# Patient Record
Sex: Female | Born: 1985
Health system: Southern US, Community
[De-identification: ages and names within clinical notes are randomized; demographics above are authoritative.]

## PROBLEM LIST (undated history)

## (undated) ENCOUNTER — Inpatient Hospital Stay (HOSPITAL_COMMUNITY): Payer: Self-pay

## (undated) DIAGNOSIS — O1410 Severe pre-eclampsia, unspecified trimester: Secondary | ICD-10-CM

## (undated) DIAGNOSIS — E785 Hyperlipidemia, unspecified: Secondary | ICD-10-CM

## (undated) DIAGNOSIS — O24419 Gestational diabetes mellitus in pregnancy, unspecified control: Secondary | ICD-10-CM

## (undated) DIAGNOSIS — A630 Anogenital (venereal) warts: Secondary | ICD-10-CM

## (undated) DIAGNOSIS — A549 Gonococcal infection, unspecified: Secondary | ICD-10-CM

## (undated) DIAGNOSIS — I471 Supraventricular tachycardia, unspecified: Secondary | ICD-10-CM

## (undated) DIAGNOSIS — Z789 Other specified health status: Secondary | ICD-10-CM

## (undated) DIAGNOSIS — I10 Essential (primary) hypertension: Secondary | ICD-10-CM

## (undated) HISTORY — PX: OTHER SURGICAL HISTORY: SHX169

## (undated) HISTORY — DX: Supraventricular tachycardia: I47.1

## (undated) HISTORY — DX: Supraventricular tachycardia, unspecified: I47.10

## (undated) HISTORY — DX: Severe pre-eclampsia, unspecified trimester: O14.10

---

## 2011-05-30 ENCOUNTER — Inpatient Hospital Stay (HOSPITAL_COMMUNITY)
Admission: AD | Admit: 2011-05-30 | Discharge: 2011-05-30 | Disposition: A | Discharge status: Home / Self Care | Payer: Self-pay | Source: Ambulatory Visit | Attending: Obstetrics and Gynecology | Admitting: Obstetrics and Gynecology

## 2011-05-30 ENCOUNTER — Encounter (HOSPITAL_COMMUNITY): Payer: Self-pay | Admitting: *Deleted

## 2011-05-30 DIAGNOSIS — Z30432 Encounter for removal of intrauterine contraceptive device: Secondary | ICD-10-CM | POA: Insufficient documentation

## 2011-05-30 DIAGNOSIS — N949 Unspecified condition associated with female genital organs and menstrual cycle: Secondary | ICD-10-CM | POA: Insufficient documentation

## 2011-05-30 DIAGNOSIS — N390 Urinary tract infection, site not specified: Secondary | ICD-10-CM | POA: Insufficient documentation

## 2011-05-30 DIAGNOSIS — N72 Inflammatory disease of cervix uteri: Secondary | ICD-10-CM | POA: Insufficient documentation

## 2011-05-30 DIAGNOSIS — A5901 Trichomonal vulvovaginitis: Secondary | ICD-10-CM | POA: Insufficient documentation

## 2011-05-30 DIAGNOSIS — R109 Unspecified abdominal pain: Secondary | ICD-10-CM | POA: Insufficient documentation

## 2011-05-30 HISTORY — DX: Anogenital (venereal) warts: A63.0

## 2011-05-30 HISTORY — DX: Gestational diabetes mellitus in pregnancy, unspecified control: O24.419

## 2011-05-30 LAB — URINE MICROSCOPIC-ADD ON

## 2011-05-30 LAB — CBC
MCH: 29.5 pg (ref 26.0–34.0)
Platelets: 176 10*3/uL (ref 150–400)
RBC: 4.37 MIL/uL (ref 3.87–5.11)
RDW: 14 % (ref 11.5–15.5)
WBC: 10.1 10*3/uL (ref 4.0–10.5)

## 2011-05-30 LAB — WET PREP, GENITAL: Clue Cells Wet Prep HPF POC: NONE SEEN

## 2011-05-30 LAB — URINALYSIS, ROUTINE W REFLEX MICROSCOPIC
Bilirubin Urine: NEGATIVE
Protein, ur: NEGATIVE mg/dL
Urobilinogen, UA: 0.2 mg/dL (ref 0.0–1.0)

## 2011-05-30 LAB — HCG, QUANTITATIVE, PREGNANCY: hCG, Beta Chain, Quant, S: <1 m[IU]/mL (ref <5)

## 2011-05-30 LAB — DIFFERENTIAL
Basophils Absolute: 0 10*3/uL (ref 0.0–0.1)
Eosinophils Absolute: 0.4 10*3/uL (ref 0.0–0.7)
Lymphocytes Relative: 27 % (ref 12–46)
Lymphs Abs: 2.8 10*3/uL (ref 0.7–4.0)
Neutrophils Relative %: 61 % (ref 43–77)

## 2011-05-30 MED ORDER — AZITHROMYCIN 250 MG PO TABS
1000.0000 mg | ORAL_TABLET | Freq: Once | ORAL | Status: AC
  Administered 2011-05-30: 1000 mg via ORAL
  Filled 2011-05-30: qty 4

## 2011-05-30 MED ORDER — METRONIDAZOLE 500 MG PO TABS: 500.0000 mg | ORAL_TABLET | Freq: Two times a day (BID) | ORAL | Status: AC

## 2011-05-30 MED ORDER — CEFTRIAXONE SODIUM 250 MG IJ SOLR
250.0000 mg | Freq: Once | INTRAMUSCULAR | Status: AC
  Administered 2011-05-30: 250 mg via INTRAMUSCULAR
  Filled 2011-05-30: qty 250

## 2011-05-30 NOTE — Progress Notes (Signed)
Pt has mirena IUD, for past 2 months has been feeling fluttering in abdomen & also sharp pains.  Pt has intermittent spotting "now & then" but not today.

## 2011-05-30 NOTE — ED Provider Notes (Signed)
History     CSN: [id number]  Arrival date & time 05/30/11  1624   None     Chief Complaint  Patient presents with  . Abdominal Pain    HPI Dorothy Meadows is a 26 y.o. female who presents to [facility] for "fluttering in abdomen". Has IUD for birth control but feels pregnant. Has had IUD for over 4 years now and request that if possible that it be removed. Vaginal bleeding "spotting off and on x 2 months. Low back pain that comes and goes. Current sex partner x 1 year. History of genital warts.   Past Medical History  Diagnosis Date  . Tachycardia   . Genital warts   . Gestational diabetes     Past Surgical History  Procedure Date  . No past surgeries     History reviewed. No pertinent family history.  History  Substance Use Topics  . Smoking status: Current Everyday Smoker -- 0.5 packs/day for 10 years    Types: Cigarettes  . Smokeless tobacco: Not on file  . Alcohol Use: No    OB History    Grav Para Term Preterm Abortions TAB SAB Ect Mult Living   2 2 2       2       Review of Systems  Constitutional: Positive for activity change. Negative for fever, chills, diaphoresis and fatigue.  HENT: Negative for ear pain, congestion, sore throat, facial swelling, neck pain, neck stiffness, dental problem and sinus pressure.   Eyes: Negative for photophobia, pain and discharge.  Respiratory: Negative for cough, chest tightness and wheezing.   Cardiovascular: Palpitations: tachycardia.  Gastrointestinal: Positive for nausea, abdominal pain, diarrhea and constipation. Negative for vomiting and abdominal distention.  Genitourinary: Positive for vaginal bleeding, vaginal discharge and pelvic pain. Negative for dysuria, frequency, flank pain, difficulty urinating and dyspareunia.  Musculoskeletal: Negative for myalgias, back pain and gait problem.  Skin: Negative for color change and rash.  Neurological: Negative for dizziness, speech difficulty, weakness, light-headedness, numbness  and headaches.  Psychiatric/Behavioral: Negative for confusion and agitation. The patient is not nervous/anxious.     Allergies  Review of patient's allergies indicates no known allergies.  Home Medications  No current outpatient prescriptions on file.  BP 125/73  Pulse 90  Temp(Src) 98.4 F (36.9 C) (Oral)  Resp 16  Ht 5\' 9"  (1.753 m)  Wt 210 lb (95.255 kg)  BMI 31.01 kg/m2  Physical Exam  Nursing note and vitals reviewed. Constitutional: She is oriented to person, place, and time. She appears well-developed and well-nourished. No distress.  HENT:  Head: Normocephalic.  Eyes: EOM are normal.  Neck: Neck supple.  Pulmonary/Chest: Effort normal.  Abdominal: Soft. There is no tenderness.  Musculoskeletal: Normal range of motion.  Neurological: She is alert and oriented to person, place, and time. No cranial nerve deficit.  Skin: Skin is warm and dry.  Psychiatric: She has a normal mood and affect. Her behavior is normal. Judgment and thought content normal.   Results for orders placed during the hospital encounter of 05/30/11 (from the past 24 hour(s))  URINALYSIS, ROUTINE W REFLEX MICROSCOPIC     Status: Abnormal   Collection Time   05/30/11  4:35 PM      Component Value Range   Color, Urine YELLOW  YELLOW    APPearance HAZY (*) CLEAR    Specific Gravity, Urine 1.020  1.005 - 1.030    pH 6.0  5.0 - 8.0    Glucose, UA NEGATIVE  NEGATIVE (mg/dL)   Hgb urine dipstick TRACE (*) NEGATIVE    Bilirubin Urine NEGATIVE  NEGATIVE    Ketones, ur NEGATIVE  NEGATIVE (mg/dL)   Protein, ur NEGATIVE  NEGATIVE (mg/dL)   Urobilinogen, UA 0.2  0.0 - 1.0 (mg/dL)   Nitrite POSITIVE (*) NEGATIVE    Leukocytes, UA MODERATE (*) NEGATIVE   URINE MICROSCOPIC-ADD ON     Status: Abnormal   Collection Time   05/30/11  4:35 PM      Component Value Range   Squamous Epithelial / LPF FEW (*) RARE    WBC, UA 11-20  <3 (WBC/hpf)   RBC / HPF 3-6  <3 (RBC/hpf)   Bacteria, UA MANY (*) RARE     Urine-Other MUCOUS PRESENT    POCT PREGNANCY, URINE     Status: Normal   Collection Time   05/30/11  4:57 PM      Component Value Range   Preg Test, Ur NEGATIVE  NEGATIVE   HCG, QUANTITATIVE, PREGNANCY     Status: Normal   Collection Time   05/30/11  5:19 PM      Component Value Range   hCG, Beta Chain, Quant, S <1  <5 (mIU/mL)  CBC     Status: Normal   Collection Time   05/30/11  6:10 PM      Component Value Range   WBC 10.1  4.0 - 10.5 (K/uL)   RBC 4.37  3.87 - 5.11 (MIL/uL)   Hemoglobin 12.9  12.0 - 15.0 (g/dL)   HCT 16.1  09.6 - 04.5 (%)   MCV 92.2  78.0 - 100.0 (fL)   MCH 29.5  26.0 - 34.0 (pg)   MCHC 32.0  30.0 - 36.0 (g/dL)   RDW 40.9  81.1 - 91.4 (%)   Platelets 176  150 - 400 (K/uL)  DIFFERENTIAL     Status: Normal   Collection Time   05/30/11  6:10 PM      Component Value Range   Neutrophils Relative 61  43 - 77 (%)   Neutro Abs 6.2  1.7 - 7.7 (K/uL)   Lymphocytes Relative 27  12 - 46 (%)   Lymphs Abs 2.8  0.7 - 4.0 (K/uL)   Monocytes Relative 8  3 - 12 (%)   Monocytes Absolute 0.8  0.1 - 1.0 (K/uL)   Eosinophils Relative 4  0 - 5 (%)   Eosinophils Absolute 0.4  0.0 - 0.7 (K/uL)   Basophils Relative 0  0 - 1 (%)   Basophils Absolute 0.0  0.0 - 0.1 (K/uL)  WET PREP, GENITAL     Status: Abnormal   Collection Time   05/30/11  6:20 PM      Component Value Range   Yeast Wet Prep HPF POC NONE SEEN  NONE SEEN    Trich, Wet Prep MODERATE (*) NONE SEEN    Clue Cells Wet Prep HPF POC NONE SEEN  NONE SEEN    WBC, Wet Prep HPF POC MODERATE (*) NONE SEEN     Assessment: Trichomonas/Cervicitis   Pelvic pain   UTI  Plan:  Rx flagyl   IUD removed per request of patient   Rocephin 250 mg po   Zithromax 1 gram po ED Course  Procedures   MDM          Kerrie Buffalo, NP 05/30/11 1948

## 2011-05-30 NOTE — ED Provider Notes (Signed)
Attestation of Attending Supervision of Advanced Practitioner: Evaluation and management procedures were performed by the PA/NP/CNM/OB Fellow under my supervision/collaboration. Chart reviewed, and agree with management and plan.  Shayana Hornstein, M.D. 05/30/2011 7:57 PM   

## 2011-06-01 ENCOUNTER — Telehealth (HOSPITAL_COMMUNITY): Payer: Self-pay | Admitting: *Deleted

## 2011-06-01 NOTE — Telephone Encounter (Signed)
Telephone call to patient regarding positive chlamydia culture, patient not in, left message for her to call.  Patient was treated at time of visit.  Report of treatment faxed to health department.

## 2011-07-09 ENCOUNTER — Emergency Department (HOSPITAL_COMMUNITY)
Admission: EM | Admit: 2011-07-09 | Discharge: 2011-07-09 | Disposition: A | Discharge status: Home / Self Care | Payer: Self-pay | Attending: Emergency Medicine | Admitting: Emergency Medicine

## 2011-07-09 ENCOUNTER — Encounter (HOSPITAL_COMMUNITY): Payer: Self-pay | Admitting: Emergency Medicine

## 2011-07-09 DIAGNOSIS — R350 Frequency of micturition: Secondary | ICD-10-CM | POA: Insufficient documentation

## 2011-07-09 DIAGNOSIS — N390 Urinary tract infection, site not specified: Secondary | ICD-10-CM

## 2011-07-09 DIAGNOSIS — F17200 Nicotine dependence, unspecified, uncomplicated: Secondary | ICD-10-CM | POA: Insufficient documentation

## 2011-07-09 LAB — URINE MICROSCOPIC-ADD ON

## 2011-07-09 LAB — POCT PREGNANCY, URINE: Preg Test, Ur: NEGATIVE

## 2011-07-09 LAB — URINALYSIS, ROUTINE W REFLEX MICROSCOPIC
Bilirubin Urine: NEGATIVE
Glucose, UA: NEGATIVE mg/dL
Specific Gravity, Urine: 1.027 (ref 1.005–1.030)
Urobilinogen, UA: 0.2 mg/dL (ref 0.0–1.0)

## 2011-07-09 MED ORDER — CIPROFLOXACIN HCL 500 MG PO TABS: 500.0000 mg | ORAL_TABLET | Freq: Two times a day (BID) | ORAL | Status: AC

## 2011-07-09 MED ORDER — PHENAZOPYRIDINE HCL 200 MG PO TABS: 200.0000 mg | ORAL_TABLET | Freq: Three times a day (TID) | ORAL | Status: DC

## 2011-07-09 MED ORDER — PHENAZOPYRIDINE HCL 200 MG PO TABS: 200.0000 mg | ORAL_TABLET | Freq: Three times a day (TID) | ORAL | Status: AC

## 2011-07-09 MED ORDER — CIPROFLOXACIN HCL 500 MG PO TABS: 500.0000 mg | ORAL_TABLET | Freq: Two times a day (BID) | ORAL | Status: DC

## 2011-07-09 NOTE — Discharge Instructions (Signed)

## 2011-07-09 NOTE — ED Notes (Signed)
Alert no distress. edp has already seen

## 2011-07-09 NOTE — ED Provider Notes (Signed)
History     CSN: [id number]  Arrival date & time 07/09/11  1448   First MD Initiated Contact with Patient 07/09/11 1525      Chief Complaint  Patient presents with  . Urinary Frequency    (Consider location/radiation/quality/duration/timing/severity/associated sxs/prior treatment) Patient is a 26 y.o. female presenting with frequency. The history is provided by the patient.  Urinary Frequency This is a new problem. Episode onset: this morning. The problem occurs constantly. The problem has been gradually worsening. Pertinent negatives include no abdominal pain. Exacerbated by: urinating. The symptoms are relieved by nothing. She has tried nothing for the symptoms.    Past Medical History  Diagnosis Date  . Tachycardia   . Genital warts   . Gestational diabetes     Past Surgical History  Procedure Date  . No past surgeries     History reviewed. No pertinent family history.  History  Substance Use Topics  . Smoking status: Current Everyday Smoker -- 0.5 packs/day for 10 years    Types: Cigarettes  . Smokeless tobacco: Not on file  . Alcohol Use: No    OB History    Grav Para Term Preterm Abortions TAB SAB Ect Mult Living   2 2 2       2       Review of Systems  Gastrointestinal: Negative for abdominal pain.  Genitourinary: Positive for frequency.  All other systems reviewed and are negative.    Allergies  Review of patient's allergies indicates no known allergies.  Home Medications  No current outpatient prescriptions on file.  BP 152/77  Pulse 76  Temp(Src) 97.4 F (36.3 C) (Oral)  Resp 18  SpO2 99%  LMP 06/06/2011  Physical Exam  Nursing note and vitals reviewed. Constitutional: She is oriented to person, place, and time. She appears well-developed and well-nourished. No distress.  HENT:  Head: Normocephalic and atraumatic.  Neck: Normal range of motion. Neck supple.  Abdominal: Soft. Bowel sounds are normal. She exhibits no distension. There  is no tenderness.  Musculoskeletal: Normal range of motion. She exhibits no edema.  Lymphadenopathy:    She has no cervical adenopathy.  Neurological: She is alert and oriented to person, place, and time.  Skin: Skin is warm and dry. She is not diaphoretic.    ED Course  Procedures (including critical care time)   Labs Reviewed  URINALYSIS, ROUTINE W REFLEX MICROSCOPIC   No results found.   No diagnosis found.    MDM  Urine looks like UTI.  Will treat with cipro, pyridium.        Geoffery Lyons, MD 07/09/11 1550

## 2011-07-09 NOTE — ED Notes (Signed)
Pt reports having dysuria and frequency onset today.

## 2011-09-12 ENCOUNTER — Inpatient Hospital Stay (HOSPITAL_COMMUNITY)
Admission: AD | Admit: 2011-09-12 | Discharge: 2011-09-12 | Disposition: A | Discharge status: Home / Self Care | Payer: Self-pay | Source: Ambulatory Visit | Attending: Obstetrics & Gynecology | Admitting: Obstetrics & Gynecology

## 2011-09-12 ENCOUNTER — Encounter (HOSPITAL_COMMUNITY): Payer: Self-pay | Admitting: *Deleted

## 2011-09-12 DIAGNOSIS — Z3201 Encounter for pregnancy test, result positive: Secondary | ICD-10-CM | POA: Insufficient documentation

## 2011-09-12 HISTORY — DX: Other specified health status: Z78.9

## 2011-09-12 LAB — POCT PREGNANCY, URINE: Preg Test, Ur: POSITIVE — AB

## 2011-09-12 NOTE — [facility] Provider Note (Signed)
Attestation of Attending Supervision of Advanced Practitioner: Evaluation and management procedures were performed by the OB Fellow/PA/CNM/NP under my supervision and collaboration. Chart reviewed, and agree with management and plan.  Isidoro Santillana, M.D. 09/12/2011 5:51 PM   

## 2011-09-12 NOTE — [facility] Note (Signed)
Patient states she had a positive pregnancy test at home today. Wants confirmation of pregnancy letter. Denies any problems with pain or bleeding.

## 2011-09-12 NOTE — [facility] Provider Note (Signed)
Dorothy Meadows is a 26 y.o. female who presents to [facility] @ 5 weeks and 2 days gestation for pregnancy verification. She denies any problems. Has had 2 positive HPT. Was here in March and had negative pregnancy test. LMP 08/07/11.  BP 139/88  Pulse 70  Temp(Src) 98.9 F (37.2 C) (Oral)  Resp 16  Ht 5\' 4"  (1.626 m)  Wt 232 lb 3.2 oz (105.325 kg)  BMI 39.86 kg/m2  SpO2 100%  LMP 08/07/2011  Results for orders placed during the hospital encounter of 09/12/11 (from the past 24 hour(s))  POCT PREGNANCY, URINE     Status: Abnormal   Collection Time   09/12/11  3:03 PM      Component Value Range   Preg Test, Ur POSITIVE (*) NEGATIVE    Assessment: Positive pregnancy test  Plan:  Pregnancy verification letter   Start prenatal care   Return here as needed for problems.

## 2011-09-12 NOTE — Discharge Instructions (Signed)
  ________________________________________     To schedule your Maternity Eligibility Appointment, please call [phone].  When you arrive for your appointment you must bring the following items or information listed below.  Your appointment will be rescheduled if you do not have these items or are 15 minutes late. If currently receiving Medicaid, you MUST bring: 1. Medicaid Card 2. Social Security Card 3. Picture ID 4. Proof of Pregnancy 5. Verification of current address if the address on Medicaid card is incorrect "postmarked mail" If not receiving Medicaid, you MUST bring: 1. Social Security Card 2. Picture ID 3. Birth Certificate (if available) Passport or *Green Card 4. Proof of Pregnancy 5. Verification of current address "postmarked mail" for each income presented. 6. Verification of insurance coverage, if any 7. Check stubs from each employer for the previous month (if unable to present check stub  for each week, we will accept check stub for the first and last week ill the same month.) If you can't locate check stubs, you must bring a letter from the employer(s) and it must have the following information on letterhead, typed, in English: o name of company o company telephone number o how long been with the company, if less than one month o how much person earns per hour o how many hours per week work o the gross pay the person earned for the previous month If you are 27 years old or less, you do not have to bring proof of income unless you work or live with the father of the baby and at that time we will need proof of income from you and/or the father of the baby. Green Card recipients are eligible for Medicaid for Pregnant Women (MPW)    Prenatal Care  WHAT IS PRENATAL CARE?  Prenatal care means health care during your pregnancy, before your baby is born. Take care of yourself and your baby by:   Getting early prenatal care. If you know you are pregnant, or think  you might be pregnant, call your caregiver as soon as possible. Schedule a visit for a general/prenatal examination.   Getting regular prenatal care. Follow your caregiver's schedule for blood and other necessary tests. Do not miss appointments.   Do everything you can to keep yourself and your baby healthy during your pregnancy.   Prenatal care should include evaluation of medical, dietary, educational, psychological, and social needs for the couple and the medical, surgical, and genetic history of the family of the mother and father.   Discuss with your caregiver:   Your medicines, prescription, over-the-counter, and herbal medicines.   Substance abuse, alcohol, smoking, and illegal drugs.   Domestic abuse and violence, if present.   Your immunizations.   Nutrition and diet.   Exercising.   Environment and occupational hazards, at home and at work.   History of sexually transmitted disease, both you and your partner.   Previous pregnancies.  WHY IS PRENATAL CARE SO IMPORTANT?  By seeing you regularly, your caregiver has the chance to find problems early, so that they can be treated as soon as possible. Other problems might be prevented. Many studies have shown that early and regular prenatal care is important for the health of both mothers and their babies.  I AM THINKING ABOUT GETTING PREGNANT. HOW CAN I TAKE CARE OF MYSELF?  Taking care of yourself before you get pregnant helps you to have a healthy pregnancy. It also lowers your chances of having a baby born with a birth   defect. Here are ways to take care of yourself before you get pregnant:   Eat healthy foods, exercise regularly (30 minutes per day for most days of the week is best), and get enough rest and sleep. Talk to your caregiver about what kinds of foods and exercises are best for you.   Take 400 micrograms (mcg) of folic acid (one of the B vitamins) every day. The best way to do this is to take a daily multivitamin  pill that contains this amount of folic acid. Getting enough of the synthetic (manufactured) form of folic acid every day before you get pregnant and during early pregnancy can help prevent certain birth defects. Many breakfast cereals and other grain products have folic acid added to them, but only certain cereals contain 400 mcg of folic acid per serving. Check the label on your multivitamin or cereal to find the amount of folic acid in the food.   See your caregiver for a complete check up before getting pregnant. Make sure that you have had all your immunization shots, especially for rubella (German measles). Rubella can cause serious birth defects. Chickenpox is another illness you want to avoid during pregnancy. If you have had chickenpox and rubella in the past, you should be immune to them.   Tell your caregiver about any prescription or non-prescription medicines (including herbal remedies) you are taking. Some medicines are not safe to take during pregnancy.   Stop smoking cigarettes, drinking alcohol, or taking illegal drugs. Ask your caregiver for help, if you need it. You can also get help with alcohol and drugs by talking with a member of your faith community, a counselor, or a trusted friend.   Discuss and treat any medical, social, or psychological problems before getting pregnant.   Discuss any history of genetic problems in the mother, father, and their families. Do genetic testing before getting pregnant, when possible.   Discuss any physical or emotional abuse with your caregiver.   Discuss with your caregiver if you might be exposed to harmful chemicals on your job or where you live.   Discuss with your caregiver if you think your job or the hours you work may be harmful and should be changed.   The father should be involved with the decision making and with all aspects of the pregnancy, labor, and delivery.   If you have medical insurance, make sure you are covered for  pregnancy.  I JUST FOUND OUT THAT I AM PREGNANT. HOW CAN I TAKE CARE OF MYSELF?  Here are ways to take care of yourself and the precious new life growing inside you:   Continue taking your multivitamin with 400 micrograms (mcg) of folic acid every day.   Get early and regular prenatal care. It does not matter if this is your first pregnancy or if you already have children. It is very important to see a caregiver during your pregnancy. Your caregiver will check at each visit to make sure that you and the baby are healthy. If there are any problems, action can be taken right away to help you and the baby.   Eat a healthy diet that includes:   Fruits.   Vegetables.   Foods low in saturated fat.   Grains.   Calcium-rich foods.   Drink 6 to 8 glasses of liquids a day.   Unless your caregiver tells you not to, try to be physically active for 30 minutes, most days of the week. If you are pressed for   time, you can get your activity in through 10 minute segments, three times a day.   If you smoke, drink alcohol, or use drugs, STOP. These can cause long-term damage to your baby. Talk with your caregiver about steps to take to stop smoking. Talk with a member of your faith community, a counselor, a trusted friend, or your caregiver if you are concerned about your alcohol or drug use.   Ask your caregiver before taking any medicine, even over-the-counter medicines. Some medicines are not safe to take during pregnancy.   Get plenty of rest and sleep.   Avoid hot tubs and saunas during pregnancy.   Do not have X-rays taken, unless absolutely necessary and with the recommendation of your caregiver. A lead shield can be placed on your abdomen, to protect the baby when X-rays are taken in other parts of the body.   Do not empty the cat litter when you are pregnant. It may contain a parasite that causes an infection called toxoplasmosis, which can cause birth defects. Also, use gloves when working in  garden areas used by cats.   Do not eat uncooked or undercooked cheese, meats, or fish.   Stay away from toxic chemicals like:   Insecticides.   Solvents (some cleaners or paint thinners).   Lead.   Mercury.   Sexual relations may continue until the end of the pregnancy, unless you have a medical problem or there is a problem with the pregnancy and your caregiver tells you not to.   Do not wear high heel shoes, especially during the second half of the pregnancy. You can lose your balance and fall.   Do not take long trips, unless absolutely necessary. Be sure to see your caregiver before going on the trip.   Do not sit in one position for more than 2 hours, when on a trip.   Take a copy of your medical records when going on a trip.   Know where there is a hospital in the city you are visiting, in case of an emergency.   Most dangerous household products will have pregnancy warnings on their labels. Ask your caregiver about products if you are unsure.   Limit or eliminate your caffeine intake from coffee, tea, sodas, medicines, and chocolate.   Many women continue working through pregnancy. Staying active might help you stay healthier. If you have a question about the safety or the hours you work at your particular job, talk with your caregiver.   Get informed:   Read books.   Watch videos.   Go to childbirth classes for you and the father.   Talk with experienced moms.   Ask your caregiver about childbirth education classes for you and your partner. Classes can help you and your partner prepare for the birth of your baby.   Ask about a pediatrician (baby doctor) and methods and pain medicine for labor, delivery, and possible Cesarean delivery (C-section).  I AM NOT THINKING ABOUT GETTING PREGNANT RIGHT NOW, BUT HEARD THAT ALL WOMEN SHOULD TAKE FOLIC ACID EVERY DAY?  All women of childbearing age, with even a remote chance of getting pregnant, should try to make sure they  get enough folic acid. Many pregnancies are not planned. Many women do not know they are actually pregnant early in their pregnancies, and certain birth defects happen in the very early part of pregnancy. Taking 400 micrograms (mcg) of folic acid every day will help prevent certain birth defects that happen in the early   part of pregnancy. If a woman begins taking vitamin pills in the second or third month of pregnancy, it may be too late to prevent birth defects. Folic acid may also have other health benefits for women, besides preventing birth defects.  HOW OFTEN SHOULD I SEE MY CAREGIVER DURING PREGNANCY?  Your caregiver will give you a schedule for your prenatal visits. You will have visits more often as you get closer to the end of your pregnancy. An average pregnancy lasts about 40 weeks.  A typical schedule includes visiting your caregiver:   About once each month, during your first 6 months of pregnancy.   Every 2 weeks, during the next 2 months.   Weekly in the last month, until the delivery date.  Your caregiver will probably want to see you more often if:  You are over 35.   Your pregnancy is high risk, because you have certain health problems or problems with the pregnancy, such as:   Diabetes.   High blood pressure.   The baby is not growing on schedule, according to the dates of the pregnancy.  Your caregiver will do special tests, to make sure you and the baby are not having any serious problems. WHAT HAPPENS DURING PRENATAL VISITS?   At your first prenatal visit, your caregiver will talk to you about you and your partner's health history and your family's health history, and will do a physical exam.   On your first visit, a physical exam will include checks of your blood pressure, height and weight, and an exam of your pelvic organs. Your caregiver will do a Pap test if you have not had one recently, and will do cultures of your cervix to make sure there is no infection.    At each visit, there will be tests of your blood, urine, blood pressure, weight, and checking the progress of the baby.   Your caregiver will be able to tell you when to expect that your baby will be born.   Each visit is also a chance for you to learn about staying healthy during pregnancy and for asking questions.   Discuss whether you will be breastfeeding.   At your later prenatal visits, your caregiver will check how you are doing and how the baby is developing. You may have a number of tests done as your pregnancy progresses.   Ultrasound exams are often used to check on the baby's growth and health.   You may have more urine and blood tests, as well as special tests, if needed. These may include amniocentesis (examine fluid in the pregnancy sac), stress tests (check how baby responds to contractions), biophysical profile (measures fetus well-being). Your caregiver will explain the tests and why they are necessary.  I AM IN MY LATE THIRTIES, AND I WANT TO HAVE A CHILD NOW. SHOULD I DO ANYTHING SPECIAL?  As you get older, there is more chance of having a medical problem (high blood pressure), pregnancy problem (preeclampsia, problems with the placenta), miscarriage, or a baby born with a birth defect. However, most women in their late thirties and early forties have healthy babies. See your caregiver on a regular basis before you get pregnant and be sure to go for exams throughout your pregnancy. Your caregiver probably will want to do some special tests to check on you and your baby's health when you are pregnant.  Women today are often delaying having children until later in life, when they are in their thirties and forties. While   many women in their thirties and forties have no difficulty getting pregnant, fertility does decline with age. For women over 40 who cannot get pregnant after 6 months of trying, it is recommended that they see their caregiver for a fertility evaluation. It is not  uncommon to have trouble becoming pregnant or experience infertility (inability to become pregnant after trying for one year). If you think that you or your partner may be infertile, you can discuss this with your caregiver. He or she can recommend treatments such as drugs, surgery, or assisted reproductive technology.  Document Released: 04/20/2003 Document Revised: 04/06/2011 Document Reviewed: 03/17/2009 [employer] Patient Information 2012 [employer]. 

## 2011-09-27 ENCOUNTER — Inpatient Hospital Stay (HOSPITAL_COMMUNITY): Payer: Self-pay

## 2011-09-27 ENCOUNTER — Inpatient Hospital Stay (HOSPITAL_COMMUNITY)
Admission: AD | Admit: 2011-09-27 | Discharge: 2011-09-27 | Disposition: A | Discharge status: Home / Self Care | Payer: Self-pay | Source: Ambulatory Visit | Attending: Obstetrics & Gynecology | Admitting: Obstetrics & Gynecology

## 2011-09-27 ENCOUNTER — Encounter (HOSPITAL_COMMUNITY): Payer: Self-pay

## 2011-09-27 DIAGNOSIS — R109 Unspecified abdominal pain: Secondary | ICD-10-CM | POA: Insufficient documentation

## 2011-09-27 DIAGNOSIS — O209 Hemorrhage in early pregnancy, unspecified: Secondary | ICD-10-CM | POA: Insufficient documentation

## 2011-09-27 LAB — URINALYSIS, ROUTINE W REFLEX MICROSCOPIC
Bilirubin Urine: NEGATIVE
Ketones, ur: NEGATIVE mg/dL
Nitrite: NEGATIVE
Protein, ur: NEGATIVE mg/dL
Specific Gravity, Urine: 1.01 (ref 1.005–1.030)
Urobilinogen, UA: 0.2 mg/dL (ref 0.0–1.0)

## 2011-09-27 LAB — CBC
MCH: 29.5 pg (ref 26.0–34.0)
MCHC: 32.2 g/dL (ref 30.0–36.0)
MCV: 91.8 fL (ref 78.0–100.0)
Platelets: 198 10*3/uL (ref 150–400)
RBC: 4.4 MIL/uL (ref 3.87–5.11)
RDW: 13.9 % (ref 11.5–15.5)

## 2011-09-27 LAB — DIFFERENTIAL
Basophils Relative: 0 % (ref 0–1)
Eosinophils Absolute: 0.3 10*3/uL (ref 0.0–0.7)
Eosinophils Relative: 2 % (ref 0–5)
Lymphs Abs: 3 10*3/uL (ref 0.7–4.0)

## 2011-09-27 LAB — WET PREP, GENITAL
Trich, Wet Prep: NONE SEEN
Yeast Wet Prep HPF POC: NONE SEEN

## 2011-09-27 LAB — URINE MICROSCOPIC-ADD ON

## 2011-09-27 LAB — HCG, QUANTITATIVE, PREGNANCY: hCG, Beta Chain, Quant, S: 338 m[IU]/mL — ABNORMAL HIGH (ref <5)

## 2011-09-27 NOTE — MAU Note (Signed)
Patient states she has been cramping and having some bleeding since 5-27. Was brown now more red. Not wearing a pad at this time.

## 2011-09-27 NOTE — Discharge Instructions (Signed)
Return here in 2 days to repeat the pregnancy hormone level. Return sooner for any problems.  Pelvic Rest Pelvic rest is sometimes recommended for women when:   The placenta is partially or completely covering the opening of the cervix (placenta previa).   There is bleeding between the uterine wall and the amniotic sac in the first trimester (subchorionic hemorrhage).   The cervix begins to open without labor starting (incompetent cervix, cervical insufficiency).   The labor is too early (preterm labor).  HOME CARE INSTRUCTIONS  Do not have sexual intercourse, stimulation, or an orgasm.   Do not use tampons, douche, or put anything in the vagina.     . Vaginal Bleeding During Pregnancy, First Trimester A small amount of bleeding (spotting) is relatively common in early pregnancy. It usually stops on its own. There are many causes for bleeding or spotting in early pregnancy. Some bleeding may be related to the pregnancy and some may not. Cramping with the bleeding is more serious and concerning. Tell your caregiver if you have any vaginal bleeding.  CAUSES  It is normal in most cases.  The pregnancy ends (miscarriage).  The pregnancy may end (threatened miscarriage).  Infection or inflammation of the cervix.  Growths (polyps) on the cervix.  Pregnancy happens outside of the uterus and in a fallopian tube (tubal pregnancy).  Many tiny cysts in the uterus instead of pregnancy tissue (molar pregnancy).  SYMPTOMS  Vaginal bleeding or spotting with or without cramps. DIAGNOSIS  To evaluate the pregnancy, your caregiver may: Do a pelvic exam.  Take blood tests.  Do an ultrasound.  It is very important to follow your caregiver's instructions.  TREATMENT  Evaluation of the pregnancy with blood tests and ultrasound.  Bed rest (getting up to use the bathroom only).  Rho-gam immunization if the mother is Rh negative and the father is Rh positive.  HOME CARE INSTRUCTIONS  If your  caregiver orders bed rest, you may need to make arrangements for the care of other children and for other responsibilities. However, your caregiver may allow you to continue light activity.  Keep track of the number of pads you use each day, how often you change pads and how soaked (saturated) they are. Write this down.  Do not use tampons. Do not douche.  Do not have sexual intercourse or orgasms until approved by your physician.  Save any tissue that you pass for your caregiver to see.  Take medicine for cramps only with your caregiver's permission.  Do not take aspirin because it can make you bleed.  SEEK IMMEDIATE MEDICAL CARE IF:  You experience severe cramps in your stomach, back or belly (abdomen).  You have an oral temperature above 102 F (38.9 C), not controlled by medicine.  You pass large clots or tissue.  Your bleeding increases or you become light-headed, weak or have fainting episodes.  You develop chills.  You are leaking or have a gush of fluid from your vagina.  You pass out while having a bowel movement. That may mean you have a ruptured tubal pregnancy.  Document Released: 01/25/2005 Document Revised: 04/06/2011 Document Reviewed: 08/06/2008  [facility] Patient Information 2012 [place], [place].Evaluation of the pregnancy with blood tests and ultrasound.   Bed rest (getting up to use the bathroom only).   Rho-gam immunization if the mother is Rh negative and the father is Rh positive.  HOME CARE INSTRUCTIONS   If your caregiver orders bed rest, you may need to make arrangements for the  care of other children and for other responsibilities. However, your caregiver may allow you to continue light activity.   Keep track of the number of pads you use each day, how often you change pads and how soaked (saturated) they are. Write this down.   Do not use tampons. Do not douche.   Do not have sexual intercourse or orgasms until approved by your physician.   Save any tissue  that you pass for your caregiver to see.   Take medicine for cramps only with your caregiver's permission.   Do not take aspirin because it can make you bleed.  SEEK IMMEDIATE MEDICAL CARE IF:   You experience severe cramps in your stomach, back or belly (abdomen).   You have an oral temperature above 102 F (38.9 C), not controlled by medicine.   You pass large clots or tissue.   Your bleeding increases or you become light-headed, weak or have fainting episodes.   You develop chills.   You are leaking or have a gush of fluid from your vagina.   You pass out while having a bowel movement. That may mean you have a ruptured tubal pregnancy.  Document Released: 01/25/2005 Document Revised: 04/06/2011 Document Reviewed: 08/06/2008 [facility] Patient Information 2012 [place], [place].

## 2011-09-27 NOTE — MAU Provider Note (Signed)
History     CSN: [id number]  Arrival date & time 09/27/11  1544   None     Chief Complaint  Patient presents with  . Abdominal Cramping  . Vaginal Bleeding    HPI Dorothy Meadows is a 26 y.o. female @ [redacted]w[redacted]d gestation who presents to MAU for vaginal bleeding in early pregnancy. The bleeding started a few days ago. Denies abdominal pain at this time. The history was provided by the patient.  Past Medical History  Diagnosis Date  . Tachycardia   . Genital warts   . Gestational diabetes   . No pertinent past medical history     Past Surgical History  Procedure Date  . No past surgeries   . Removal of warts     Family History  Problem Relation Age of Onset  . Diabetes Mother   . Hypertension Mother   . Depression Mother   . Anxiety disorder Mother     History  Substance Use Topics  . Smoking status: Current Everyday Smoker -- 0.5 packs/day for 10 years    Types: Cigarettes  . Smokeless tobacco: Not on file  . Alcohol Use: No    OB History    Grav Para Term Preterm Abortions TAB SAB Ect Mult Living   3 2 2       2       Review of Systems  Constitutional: Negative for fever, chills, diaphoresis and fatigue.  HENT: Negative for ear pain, congestion, sore throat, facial swelling, neck pain, neck stiffness, dental problem and sinus pressure.   Eyes: Negative for photophobia, pain and discharge.  Respiratory: Negative for cough, chest tightness and wheezing.   Gastrointestinal: Negative for nausea, vomiting, abdominal pain, diarrhea, constipation and abdominal distention.  Genitourinary: Positive for vaginal bleeding and vaginal discharge. Negative for dysuria, frequency, flank pain and difficulty urinating.  Musculoskeletal: Negative for myalgias, back pain and gait problem.  Skin: Negative for color change and rash.  Neurological: Negative for dizziness, speech difficulty, weakness, light-headedness, numbness and headaches.  Psychiatric/Behavioral: Negative for  confusion and agitation.    Allergies  Review of patient's allergies indicates no known allergies.  Home Medications  No current outpatient prescriptions on file.  BP 125/69  Pulse 70  Temp(Src) 99.1 F (37.3 C) (Oral)  Resp 18  Ht 5\' 6"  (1.676 m)  Wt 233 lb 9.6 oz (105.96 kg)  BMI 37.70 kg/m2  SpO2 100%  LMP 08/13/2011  Physical Exam  Nursing note and vitals reviewed. Constitutional: She is oriented to person, place, and time. She appears well-developed and well-nourished.  HENT:  Head: Normocephalic.  Eyes: EOM are normal.  Neck: Neck supple.  Cardiovascular: Normal rate.   Pulmonary/Chest: Effort normal.  Abdominal: Soft. There is no tenderness.  Genitourinary:       External genitalia without lesions. Small dark blood vaginal vault. Cervix closed, long, no CMT, no adnexal mass or tenderness palpated. Uterus without palpable enlargement.  Musculoskeletal: Normal range of motion.  Neurological: She is alert and oriented to person, place, and time. No cranial nerve deficit.  Skin: Skin is warm and dry.  Psychiatric: She has a normal mood and affect. Her behavior is normal. Judgment and thought content normal.    ED Course  Procedures   Results for orders placed during the hospital encounter of 09/27/11 (from the past 24 hour(s))  CBC     Status: Normal   Collection Time   09/27/11  5:05 PM      Component Value  Range   WBC 10.3  4.0 - 10.5 (K/uL)   RBC 4.40  3.87 - 5.11 (MIL/uL)   Hemoglobin 13.0  12.0 - 15.0 (g/dL)   HCT 62.1  30.8 - 65.7 (%)   MCV 91.8  78.0 - 100.0 (fL)   MCH 29.5  26.0 - 34.0 (pg)   MCHC 32.2  30.0 - 36.0 (g/dL)   RDW 84.6  96.2 - 95.2 (%)   Platelets 198  150 - 400 (K/uL)  DIFFERENTIAL     Status: Normal   Collection Time   09/27/11  5:05 PM      Component Value Range   Neutrophils Relative 60  43 - 77 (%)   Neutro Abs 6.2  1.7 - 7.7 (K/uL)   Lymphocytes Relative 29  12 - 46 (%)   Lymphs Abs 3.0  0.7 - 4.0 (K/uL)   Monocytes Relative  8  3 - 12 (%)   Monocytes Absolute 0.8  0.1 - 1.0 (K/uL)   Eosinophils Relative 2  0 - 5 (%)   Eosinophils Absolute 0.3  0.0 - 0.7 (K/uL)   Basophils Relative 0  0 - 1 (%)   Basophils Absolute 0.0  0.0 - 0.1 (K/uL)  HCG, QUANTITATIVE, PREGNANCY     Status: Abnormal   Collection Time   09/27/11  5:05 PM      Component Value Range   hCG, Beta Chain, Quant, S 338 (*) <5 (mIU/mL)  ABO/RH     Status: Normal   Collection Time   09/27/11  5:05 PM      Component Value Range   ABO/RH(D) A POS     US Ob Comp Less 14 Wks  09/27/2011  *RADIOLOGY REPORT*  Clinical Data: Pregnant patient with bleeding and cramping. Quantitative HCG 338.  OBSTETRIC <14 WK [name] AND TRANSVAGINAL OB US  Technique:  Both transabdominal and transvaginal ultrasound examinations were performed for complete evaluation of the gestation as well as the maternal uterus, adnexal regions, and pelvic cul-de-sac.  Transvaginal technique was performed to assess early pregnancy.  Comparison:  None.  Intrauterine gestational sac:  Visualized/normal in shape. Yolk sac: Not visualized. Embryo: Not visualized. Cardiac Activity: Not applicable.  MSD: 3.9  mm  4    w 6    d  Maternal uterus/adnexae: Unremarkable.  IMPRESSION: Gestational sac is identified within the uterus but no yolk sac or embryo is seen.  This could be due to early gestation.  Recommend follow-up quantitative HCG.  No evidence of ectopic pregnancy.  Original Report Authenticated By: Bernadene Bell. D'ALESSIO, M.D.   US Ob Transvaginal  09/27/2011  *RADIOLOGY REPORT*  Clinical Data: Pregnant patient with bleeding and cramping. Quantitative HCG 338.  OBSTETRIC <14 WK [name] AND TRANSVAGINAL OB US  Technique:  Both transabdominal and transvaginal ultrasound examinations were performed for complete evaluation of the gestation as well as the maternal uterus, adnexal regions, and pelvic cul-de-sac.  Transvaginal technique was performed to assess early pregnancy.  Comparison:  None.  Intrauterine  gestational sac:  Visualized/normal in shape. Yolk sac: Not visualized. Embryo: Not visualized. Cardiac Activity: Not applicable.  MSD: 3.9  mm  4    w 6    d  Maternal uterus/adnexae: Unremarkable.  IMPRESSION: Gestational sac is identified within the uterus but no yolk sac or embryo is seen.  This could be due to early gestation.  Recommend follow-up quantitative HCG.  No evidence of ectopic pregnancy.  Original Report Authenticated By: Bernadene Bell. Maricela Curet, M.D.   Assessment:  IUGS at 4 weeks 6 days but no YS  Diff. Dx.: Failed pregnancy   Early IUP   Ectopic pregnancy    Plan:  Return in 48 hours to repeat the Bhcg   Ectopic precautions    MDM

## 2011-09-28 LAB — GC/CHLAMYDIA PROBE AMP, GENITAL
Chlamydia, DNA Probe: NEGATIVE
GC Probe Amp, Genital: NEGATIVE

## 2011-09-29 ENCOUNTER — Inpatient Hospital Stay (HOSPITAL_COMMUNITY)
Admission: AD | Admit: 2011-09-29 | Discharge: 2011-09-29 | Disposition: A | Discharge status: Home / Self Care | Payer: Self-pay | Source: Ambulatory Visit | Attending: Obstetrics & Gynecology | Admitting: Obstetrics & Gynecology

## 2011-09-29 DIAGNOSIS — O200 Threatened abortion: Secondary | ICD-10-CM

## 2011-09-29 DIAGNOSIS — O021 Missed abortion: Secondary | ICD-10-CM | POA: Insufficient documentation

## 2011-09-29 DIAGNOSIS — O00109 Unspecified tubal pregnancy without intrauterine pregnancy: Secondary | ICD-10-CM | POA: Insufficient documentation

## 2011-09-29 NOTE — Discharge Instructions (Signed)
Threatened Miscarriage  Bleeding during the first 20 weeks of pregnancy is common. This is sometimes called a threatened miscarriage. This is a pregnancy that is threatening to end before the twentieth week of pregnancy. Often this bleeding stops with bed rest or decreased activities as suggested by your caregiver and the pregnancy continues without any more problems. You may be asked to not have sexual intercourse, have orgasms or use tampons until further notice. Sometimes a threatened miscarriage can progress to a complete or incomplete miscarriage. This may or may not require further treatment. Some miscarriages occur before a woman misses a menstrual period and knows she is pregnant.  Miscarriages occur in 15 to 20% of all pregnancies and usually occur during the first 13 weeks of the pregnancy. The exact cause of a miscarriage is usually never known. A miscarriage is natures way of ending a pregnancy that is abnormal or would not make it to term. There are some things that may put you at risk to have a miscarriage, such as:   Hormone problems.   Infection of the uterus or cervix.   Chronic illness, diabetes for example, especially if it is not controlled.   Abnormal shaped uterus.   Fibroids in the uterus.   Incompetent cervix (the cervix is too weak to hold the baby).   Smoking.   Drinking too much alcohol. It's best not to drink any alcohol when you are pregnant.   Taking illegal drugs.  TREATMENT   When a miscarriage becomes complete and all products of conception (all the tissue in the uterus) have been passed, often no treatment is needed. If you think you passed tissue, save it in a container and take it to your doctor for evaluation. If the miscarriage is incomplete (parts of the fetus or placenta remain in the uterus), further treatment may be needed. The most common reason for further treatment is continued bleeding (hemorrhage) because pregnancy tissue did not pass out of the uterus. This  often occurs if a miscarriage is incomplete. Tissue left behind may also become infected. Treatment usually is dilatation and curettage (the removal of the remaining products of pregnancy. This can be done by a simple sucking procedure (suction curettage) or a simple scraping of the inside of the uterus. This may be done in the hospital or in the caregiver's office. This is only done when your caregiver knows that there is no chance for the pregnancy to proceed to term. This is determined by physical examination, negative pregnancy test, falling pregnancy hormone count and/or, an ultrasound revealing a dead fetus.  Miscarriages are often a very emotional time for prospective mothers and fathers. This is not you or your partners fault. It did not occur because of an inadequacy in you or your partner. Nearly all miscarriages occur because the pregnancy has started off wrongly. At least half of these pregnancies have a chromosomal abnormality. It is almost always not inherited. Others may have developmental problems with the fetus or placenta. This does not always show up even when the products miscarried are studied under the microscope. The miscarriage is nearly always not your fault and it is not likely that you could have prevented it from happening. If you are having emotional and grieving problems, talk to your health care provider and even seek counseling, if necessary, before getting pregnant again. You can begin trying for another pregnancy as soon as your caregiver says it is OK.  HOME CARE INSTRUCTIONS    Your caregiver may order   bed rest depending on how much bleeding and cramping you are having. You may be limited to only getting up to go to the bathroom. You may be allowed to continue light activity. You may need to make arrangements for the care of your other children and for any other responsibilities.   Keep track of the number of pads you use each day, how often you have to change pads and how  saturated (soaked) they are. Record this information.   DO NOT USE TAMPONS. Do not douche, have sexual intercourse or orgasms until approved by your caregiver.   You may receive a follow up appointment for re-evaluation of your pregnancy and a repeat blood test. Re-evaluation often occurs after 2 days and again in 4 to 6 weeks. It is very important that you follow-up in the recommended time period.   If you are Rh negative and the father is Rh positive or you do not know the fathers' blood type, you may receive a shot (Rh immune globulin) to help prevent abnormal antibodies that can develop and affect the baby in any future pregnancies.  SEEK IMMEDIATE MEDICAL CARE IF:   You have severe cramps in your stomach, back, or abdomen.   You have a sudden onset of severe pain in the lower part of your abdomen.   You develop chills.   You run an unexplained temperature of 101 F (38.3 C) or higher.   You pass large clots or tissue. Save any tissue for your caregiver to inspect.   Your bleeding increases or you become light-headed, weak, or have fainting episodes.   You have a gush of fluid from your vagina.   You pass out. This could mean you have a tubal (ectopic) pregnancy.  Document Released: 04/17/2005 Document Revised: 04/06/2011 Document Reviewed: 12/02/2007  [employer] Patient Information 2012 [employer].

## 2011-09-29 NOTE — MAU Note (Signed)
Patient to MAU for repeat BHCG. States she has cramping on and off, none now and has bleeding on and off , none now.

## 2011-09-29 NOTE — MAU Provider Note (Signed)
  History     CSN: [id number]  Arrival date and time: 09/29/11 1641   None     Chief Complaint  Patient presents with  . Follow-up   HPI This is a 26 y.o. female at [redacted]w[redacted]d who presents for follow up HCG.  She was seen here earlier this week for evaluation of pain.  Has intermittent cramping and bleeding, none now.  Last HCG was 338.  OB History    Grav Para Term Preterm Abortions TAB SAB Ect Mult Living   3 2 2       2       Past Medical History  Diagnosis Date  . Tachycardia   . Genital warts   . Gestational diabetes   . No pertinent past medical history     Past Surgical History  Procedure Date  . No past surgeries   . Removal of warts     Family History  Problem Relation Age of Onset  . Diabetes Mother   . Hypertension Mother   . Depression Mother   . Anxiety disorder Mother     History  Substance Use Topics  . Smoking status: Current Everyday Smoker -- 0.5 packs/day for 10 years    Types: Cigarettes  . Smokeless tobacco: Not on file  . Alcohol Use: No    Allergies: No Known Allergies  No prescriptions prior to admission    ROS As listed in HPI  Physical Exam   Blood pressure 116/76, pulse 85, temperature 99.1 F (37.3 C), temperature source Oral, resp. rate 16, last menstrual period 08/13/2011, SpO2 100.00%.  Physical Exam  Constitutional: She is oriented to person, place, and time. She appears well-developed and well-nourished. No distress.  Respiratory: Effort normal.  Neurological: She is alert and oriented to person, place, and time.  Skin: Skin is warm and dry.  Psychiatric: She has a normal mood and affect.   Pelvic exam deferred  Results for orders placed during the hospital encounter of 09/29/11 (from the past 24 hour(s))  HCG, QUANTITATIVE, PREGNANCY     Status: Abnormal   Collection Time   09/29/11  4:59 PM      Component Value Range   hCG, Beta Chain, Quant, S 305 (*) <5 (mIU/mL)    MAU Course  Procedures  Assessment and  Plan  A:  Pregnancy at [redacted]w[redacted]d       Quantitative HCG dropping      Failed IUP vs Ectopic P:  Repeat quant in 48 hrs       May need f/u US next week  [facility] 09/29/2011, 5:27 PM

## 2011-10-02 ENCOUNTER — Inpatient Hospital Stay (HOSPITAL_COMMUNITY)
Admission: AD | Admit: 2011-10-02 | Discharge: 2011-10-02 | Disposition: A | Discharge status: Home / Self Care | Payer: Self-pay | Source: Ambulatory Visit | Attending: Obstetrics & Gynecology | Admitting: Obstetrics & Gynecology

## 2011-10-02 DIAGNOSIS — O039 Complete or unspecified spontaneous abortion without complication: Secondary | ICD-10-CM | POA: Insufficient documentation

## 2011-10-02 LAB — HCG, QUANTITATIVE, PREGNANCY: hCG, Beta Chain, Quant, S: 34 m[IU]/mL — ABNORMAL HIGH (ref <5)

## 2011-10-02 NOTE — MAU Provider Note (Signed)
History   Chief Complaint:  Follow-up   Dorothy Meadows is  26 y.o. [id number] Patient's last menstrual period was 08/13/2011.Marland Kitchen Patient is here for follow up of quantitative HCG and ongoing surveillance of pregnancy status.   She is [redacted]w[redacted]d weeks gestation  by LMP w/ drop in quant    Since her last visit, the patient is without new complaint.   The patient reports bleeding as none. Denies abd pain. General ROS:  negative  Her previous Quantitative HCG values are:  Results for Dorothy, Meadows (MRN [id number]) as of 10/02/2011 18:18  Ref. Range 09/27/2011 17:05 09/29/2011 16:59  hCG, Beta Chain, Quant, S Latest Range: <5 mIU/mL 338 (H) 305 (H)    Physical Exam   Blood pressure 123/62, pulse 79, temperature 98.6 F (37 C), temperature source Oral, resp. rate 16, last menstrual period 08/13/2011, SpO2 100.00%.  Focused Gynecological Exam: deferred Labs: Recent Results (from the past 24 hour(s))  HCG, QUANTITATIVE, PREGNANCY   Collection Time   10/02/11  4:24 PM      Component Value Range   hCG, Beta Chain, Quant, S 34 (*) <5 (mIU/mL)    Ultrasound Studies:   US Ob Comp Less 14 Wks  09/27/2011  *RADIOLOGY REPORT*  Clinical Data: Pregnant patient with bleeding and cramping. Quantitative HCG 338.  OBSTETRIC <14 WK [name] AND TRANSVAGINAL OB US  Technique:  Both transabdominal and transvaginal ultrasound examinations were performed for complete evaluation of the gestation as well as the maternal uterus, adnexal regions, and pelvic cul-de-sac.  Transvaginal technique was performed to assess early pregnancy.  Comparison:  None.  Intrauterine gestational sac:  Visualized/normal in shape. Yolk sac: Not visualized. Embryo: Not visualized. Cardiac Activity: Not applicable.  MSD: 3.9  mm  4    w 6    d  Maternal uterus/adnexae: Unremarkable.  IMPRESSION: Gestational sac is identified within the uterus but no yolk sac or embryo is seen.  This could be due to early gestation.  Recommend follow-up quantitative  HCG.  No evidence of ectopic pregnancy.  Original Report Authenticated By: Bernadene Bell. D'ALESSIO, M.D.   US Ob Transvaginal  09/27/2011  *RADIOLOGY REPORT*  Clinical Data: Pregnant patient with bleeding and cramping. Quantitative HCG 338.  OBSTETRIC <14 WK [name] AND TRANSVAGINAL OB US  Technique:  Both transabdominal and transvaginal ultrasound examinations were performed for complete evaluation of the gestation as well as the maternal uterus, adnexal regions, and pelvic cul-de-sac.  Transvaginal technique was performed to assess early pregnancy.  Comparison:  None.  Intrauterine gestational sac:  Visualized/normal in shape. Yolk sac: Not visualized. Embryo: Not visualized. Cardiac Activity: Not applicable.  MSD: 3.9  mm  4    w 6    d  Maternal uterus/adnexae: Unremarkable.  IMPRESSION: Gestational sac is identified within the uterus but no yolk sac or embryo is seen.  This could be due to early gestation.  Recommend follow-up quantitative HCG.  No evidence of ectopic pregnancy.  Original Report Authenticated By: Bernadene Bell. Maricela Curet, M.D.    Assessment: 1. SAB (spontaneous abortion)   IUP not confirmed, but drop in quant c/w SAB or spontaneously resolving ectopic.  Plan: D/C  Home  F/U in Gyn clinic in 1 week for quant Bleeding/ectopic precautions Support given Preconceptions counseling done  Dorothy Meadows 10/02/2011, 6:21 PM

## 2011-10-02 NOTE — MAU Note (Signed)
Patient to MAU for follow up BHCG. Patient denies any pain or bleeding at this time.

## 2011-10-02 NOTE — Discharge Instructions (Signed)
Miscarriage (Spontaneous Miscarriage)  A miscarriage is when you lose your baby before the twentieth week of pregnancy. Miscarriages happen in 15-20% of pregnancies. Most miscarriages happen in the first 13 weeks of the pregnancy. In medical terms, this is called a spontaneous miscarriage or early pregnancy loss. No further treatment is needed when the miscarriage is complete and all products of conception have been passed out of the body. You can begin trying for another pregnancy as soon as your caregiver says it is okay.  CAUSES    Most causes are not known.   Genetic problems like abnormal, not enough or too many chromosomes.   Infection of the cervix or uterus.   An abnormal shaped uterus, fibroid tumors or congenital abnormalities.   Hormone problems.   Medical problems.   Incompetent cervix, the tissue in the cervix is not strong enough to hold the pregnancy.   Smoking, too much alcohol use and illegal drugs.   Trauma.  SYMPTOMS    Bleeding or spotting from the vagina.   Cramping of the lower abdomen.   Passing of fluid from the vagina with or without cramps or pain.   Passing fetal tissue.  TREATMENT    Sometimes no further treatment is necessary if you pass all the tissue in the uterus.   If partial parts of the fetus or placenta remain in the body (incomplete miscarriage), tissue left behind may become infected. Usually a D and C (Dilatation and Curettage) suction or scrapping of the uterus is necessary to remove the remaining tissue in uterus. The procedure is only done when your caregiver knows that there is no chance for the pregnancy to continue. This is determined by a physical exam, a negative pregnancy test, blood tests and perhaps an ultrasound revealing a dead fetus or no fetus developing because a problem occurred at conception (when the sperm and egg unite).   Medications may be necessary, antibiotics if there is an infection or medications to contract the uterus if there is a  lot of bleeding.   If you have Rh negative blood and your partner is Rh positive, you will need a Rho-gam shot (an immune globulin vaccine). This will protect your baby from having Rh blood problems in future pregnancies.  HOME CARE INSTRUCTIONS    Your caregiver may order bed rest (up to the bathroom only). He or she may allow you to continue light activity. You may need to make arrangements for the care of children and for any other responsibilities.   Keep track of the number of pads you use each day and how soaked (saturated) they are. Record this information.   Do not use tampons. Do not douche or have sexual intercourse until approved by your caregiver.   Only take over-the-counter or prescription medicines for pain, discomfort or fever as directed by your caregiver.   Do not take aspirin because it can cause bleeding.   It is very important to keep all follow-up appointments for re-evaluations and continuing management.   Tell your caregiver if you are experiencing domestic violence.   Women who have an Rh negative blood type (i.e., A, B, AB, or O negative) need to receive a drug called Rh(D) immune globulin (RhoGam). This medicine helps protect future fetuses against problems that can occur if an Rh negative mother is carrying a baby who is Rh positive.   If you and/or your partner are having problems with guilt or grieving, talk to your caregiver or seek counseling to help   you cope with the pregnancy loss. Allow enough time to grieve before trying to get pregnant again.  SEEK IMMEDIATE MEDICAL CARE IF:    You have severe cramps or pain in your stomach, back, or belly (abdomen).   You have a fever.   You pass large clots or tissue. Save any tissue for your caregiver to inspect.   Your bleeding increases.   You become light-headed, weak or have fainting episodes.   You develop chills.  Document Released: 10/11/2000 Document Revised: 04/06/2011 Document Reviewed: 11/18/2007  [employer] Patient  Information 2012 [employer].

## 2011-10-04 NOTE — MAU Provider Note (Signed)
Attestation of Attending Supervision of Advanced Practitioner: Evaluation and management procedures were performed by the OB Fellow/PA/CNM/NP under my supervision and collaboration. Chart reviewed, and agree with management and plan.  Krisa Blattner, M.D. 10/04/2011 8:05 AM   

## 2011-10-05 ENCOUNTER — Encounter: Payer: Self-pay | Admitting: Family Medicine

## 2011-10-09 NOTE — MAU Provider Note (Signed)
Attestation of Attending Supervision of Advanced Practitioner: Evaluation and management procedures were performed by the PA/NP/CNM/OB Fellow under my supervision/collaboration. Chart reviewed and agree with management and plan.  Pantelis Elgersma V 10/09/2011 7:16 PM

## 2011-10-11 ENCOUNTER — Ambulatory Visit: Payer: Self-pay | Admitting: Obstetrics and Gynecology

## 2012-04-26 ENCOUNTER — Encounter (HOSPITAL_COMMUNITY): Payer: Self-pay

## 2012-04-26 ENCOUNTER — Inpatient Hospital Stay (HOSPITAL_COMMUNITY): Payer: Self-pay

## 2012-04-26 ENCOUNTER — Inpatient Hospital Stay (HOSPITAL_COMMUNITY)
Admission: AD | Admit: 2012-04-26 | Discharge: 2012-04-26 | Disposition: A | Discharge status: Home / Self Care | Payer: Self-pay | Source: Ambulatory Visit | Attending: Obstetrics & Gynecology | Admitting: Obstetrics & Gynecology

## 2012-04-26 DIAGNOSIS — R1084 Generalized abdominal pain: Secondary | ICD-10-CM

## 2012-04-26 DIAGNOSIS — B9689 Other specified bacterial agents as the cause of diseases classified elsewhere: Secondary | ICD-10-CM | POA: Insufficient documentation

## 2012-04-26 DIAGNOSIS — O2390 Unspecified genitourinary tract infection in pregnancy, unspecified trimester: Secondary | ICD-10-CM | POA: Insufficient documentation

## 2012-04-26 DIAGNOSIS — A499 Bacterial infection, unspecified: Secondary | ICD-10-CM | POA: Insufficient documentation

## 2012-04-26 DIAGNOSIS — N949 Unspecified condition associated with female genital organs and menstrual cycle: Secondary | ICD-10-CM | POA: Insufficient documentation

## 2012-04-26 DIAGNOSIS — N760 Acute vaginitis: Secondary | ICD-10-CM

## 2012-04-26 DIAGNOSIS — Z3490 Encounter for supervision of normal pregnancy, unspecified, unspecified trimester: Secondary | ICD-10-CM

## 2012-04-26 DIAGNOSIS — Z3201 Encounter for pregnancy test, result positive: Secondary | ICD-10-CM

## 2012-04-26 DIAGNOSIS — R109 Unspecified abdominal pain: Secondary | ICD-10-CM | POA: Insufficient documentation

## 2012-04-26 LAB — URINE MICROSCOPIC-ADD ON

## 2012-04-26 LAB — URINALYSIS, ROUTINE W REFLEX MICROSCOPIC
Ketones, ur: NEGATIVE mg/dL
Nitrite: NEGATIVE
Protein, ur: NEGATIVE mg/dL
pH: 7 (ref 5.0–8.0)

## 2012-04-26 LAB — CBC
HCT: 40.6 % (ref 36.0–46.0)
Hemoglobin: 13.6 g/dL (ref 12.0–15.0)
WBC: 11.8 10*3/uL — ABNORMAL HIGH (ref 4.0–10.5)

## 2012-04-26 LAB — WET PREP, GENITAL: Trich, Wet Prep: NONE SEEN

## 2012-04-26 MED ORDER — METRONIDAZOLE 500 MG PO TABS: 500.0000 mg | ORAL_TABLET | Freq: Two times a day (BID) | ORAL | Status: AC

## 2012-04-26 NOTE — MAU Note (Signed)
Patient is in with c/o abdominal pain. She states that she wants to make sure pregnancy is normal because she had a miscarriage in April 2013. She denies vaginal bleeding today. She is unsure of her lmp as well

## 2012-04-26 NOTE — MAU Provider Note (Signed)
History     CSN: [id number]  Arrival date and time: 04/26/12 1601   None     Chief Complaint  Patient presents with  . Abdominal Pain   HPI  Patient is in with c/o abdominal pain. She states that she wants to make sure pregnancy is normal because she had a miscarriage in April 2013. She denies vaginal bleeding today. She is unsure of her lmp as well.   Past Medical History  Diagnosis Date  . Tachycardia   . Genital warts   . Gestational diabetes   . No pertinent past medical history     Past Surgical History  Procedure Date  . No past surgeries   . Removal of warts     Family History  Problem Relation Age of Onset  . Diabetes Mother   . Hypertension Mother   . Depression Mother   . Anxiety disorder Mother     History  Substance Use Topics  . Smoking status: Current Every Day Smoker -- 0.5 packs/day for 10 years    Types: Cigarettes  . Smokeless tobacco: Not on file  . Alcohol Use: No    Allergies: No Known Allergies  Prescriptions prior to admission  Medication Sig Dispense Refill  . acetaminophen (TYLENOL) 500 MG tablet Take 500 mg by mouth every 6 (six) hours as needed. pain        Review of Systems  Gastrointestinal: Positive for abdominal pain.  Genitourinary:       Irregular bleeding  All other systems reviewed and are negative.   Physical Exam   Blood pressure 129/62, pulse 50, temperature 98.4 F (36.9 C), temperature source Oral, resp. rate 18, height 5\' 9"  (1.753 m), weight 105.008 kg (231 lb 8 oz), last menstrual period 02/13/2012, unknown if currently breastfeeding.  Physical Exam  Constitutional: She is oriented to person, place, and time. She appears well-developed and well-nourished. No distress.  HENT:  Head: Normocephalic.  Eyes: Pupils are equal, round, and reactive to light.  Neck: Normal range of motion. Neck supple.  Cardiovascular: Normal rate, regular rhythm and normal heart sounds.   Respiratory: Effort normal and  breath sounds normal.  GI: Soft. There is no tenderness.  Genitourinary: Uterus is enlarged. Vaginal discharge (white, creamy) found.       Negative cervical motion tenderness  Neurological: She is alert and oriented to person, place, and time. She has normal reflexes.  Skin: Skin is warm and dry.    MAU Course  Procedures  Ultrasound: IMPRESSION:  1. Currently no intrauterine pregnancy is visualized, and no  adnexal mass or free pelvic fluid is seen. Differential diagnostic  considerations include very early pregnancy, occult ectopic  pregnancy, or spontaneous abortion. Correlation with quantitative  beta HCG trend and a low threshold for ultrasound reimaging is  recommended.  Results for orders placed during the hospital encounter of 04/26/12 (from the past 24 hour(s))  URINALYSIS, ROUTINE W REFLEX MICROSCOPIC     Status: Abnormal   Collection Time   04/26/12  4:26 PM      Component Value Range   Color, Urine STRAW (*) YELLOW   APPearance HAZY (*) CLEAR   Specific Gravity, Urine 1.015  1.005 - 1.030   pH 7.0  5.0 - 8.0   Glucose, UA NEGATIVE  NEGATIVE mg/dL   Hgb urine dipstick NEGATIVE  NEGATIVE   Bilirubin Urine NEGATIVE  NEGATIVE   Ketones, ur NEGATIVE  NEGATIVE mg/dL   Protein, ur NEGATIVE  NEGATIVE mg/dL  Urobilinogen, UA 0.2  0.0 - 1.0 mg/dL   Nitrite NEGATIVE  NEGATIVE   Leukocytes, UA SMALL (*) NEGATIVE  URINE MICROSCOPIC-ADD ON     Status: Abnormal   Collection Time   04/26/12  4:26 PM      Component Value Range   Squamous Epithelial / LPF MANY (*) RARE   WBC, UA 0-2  <3 WBC/hpf   Bacteria, UA FEW (*) RARE  POCT PREGNANCY, URINE     Status: Abnormal   Collection Time   04/26/12  4:28 PM      Component Value Range   Preg Test, Ur POSITIVE (*) NEGATIVE  WET PREP, GENITAL     Status: Abnormal   Collection Time   04/26/12  4:35 PM      Component Value Range   Yeast Wet Prep HPF POC NONE SEEN  NONE SEEN   Trich, Wet Prep NONE SEEN  NONE SEEN   Clue Cells  Wet Prep HPF POC MODERATE (*) NONE SEEN   WBC, Wet Prep HPF POC FEW (*) NONE SEEN  CBC     Status: Abnormal   Collection Time   04/26/12  4:43 PM      Component Value Range   WBC 11.8 (*) 4.0 - 10.5 K/uL   RBC 4.51  3.87 - 5.11 MIL/uL   Hemoglobin 13.6  12.0 - 15.0 g/dL   HCT 16.1  09.6 - 04.5 %   MCV 90.0  78.0 - 100.0 fL   MCH 30.2  26.0 - 34.0 pg   MCHC 33.5  30.0 - 36.0 g/dL   RDW 40.9  81.1 - 91.4 %   Platelets 191  150 - 400 K/uL  HCG, QUANTITATIVE, PREGNANCY     Status: Abnormal   Collection Time   04/26/12  4:44 PM      Component Value Range   hCG, Beta Chain, Quant, S 555 (*) <5 mIU/mL     Assessment and Plan  Pelvic Pain in Pregnancy Bacterial Vaginosis  Plan: DC to home RX Flagyl F/U BHCG in 48 hrs Ectopic Precautions  [facility] 04/26/2012, 5:27 PM

## 2012-04-27 NOTE — MAU Provider Note (Signed)
Attestation of Attending Supervision of Advanced Practitioner (CNM/NP): Evaluation and management procedures were performed by the Advanced Practitioner under my supervision and collaboration. I have reviewed the Advanced Practitioner's note and chart, and I agree with the management and plan.  Purl Claytor H. 5:56 AM

## 2012-04-28 LAB — GC/CHLAMYDIA PROBE AMP: GC Probe RNA: POSITIVE — AB

## 2012-05-01 NOTE — L&D Delivery Note (Signed)
Delivery Note At 7:29 PM a viable female was delivered via Vaginal, Spontaneous Delivery (Presentation: Left Occiput Anterior).  APGAR: 9, 10; weight 6 lb 8.4 oz (2960 g).   Placenta status: Intact, Spontaneous.  Cord: 3 vessels with the following complications: None.    Anesthesia: Epidural  Episiotomy: None Lacerations: 1st degree;Periurethral Suture Repair: 3.0 vicryl rapide Est. Blood Loss (mL): 400  Mom to postpartum.  Baby to nursery-stable. Mother plans to breastfeed, undecided BCM.   Tereso Newcomer, M.D. 01/03/2013, 8:39 PM

## 2012-05-08 ENCOUNTER — Inpatient Hospital Stay (HOSPITAL_COMMUNITY)
Admission: AD | Admit: 2012-05-08 | Discharge: 2012-05-08 | Disposition: A | Discharge status: Home / Self Care | Payer: Self-pay | Source: Ambulatory Visit | Attending: Obstetrics and Gynecology | Admitting: Obstetrics and Gynecology

## 2012-05-08 ENCOUNTER — Encounter (HOSPITAL_COMMUNITY): Payer: Self-pay | Admitting: Advanced Practice Midwife

## 2012-05-08 ENCOUNTER — Inpatient Hospital Stay (HOSPITAL_COMMUNITY): Payer: Self-pay

## 2012-05-08 DIAGNOSIS — Z3200 Encounter for pregnancy test, result unknown: Secondary | ICD-10-CM

## 2012-05-08 DIAGNOSIS — Z363 Encounter for antenatal screening for malformations: Secondary | ICD-10-CM | POA: Insufficient documentation

## 2012-05-08 DIAGNOSIS — Z1389 Encounter for screening for other disorder: Secondary | ICD-10-CM | POA: Insufficient documentation

## 2012-05-08 DIAGNOSIS — Z3490 Encounter for supervision of normal pregnancy, unspecified, unspecified trimester: Secondary | ICD-10-CM

## 2012-05-08 DIAGNOSIS — O99891 Other specified diseases and conditions complicating pregnancy: Secondary | ICD-10-CM | POA: Insufficient documentation

## 2012-05-08 NOTE — MAU Note (Signed)
No bleeding. Feels some "period cramps" in lower abdomen.

## 2012-06-20 ENCOUNTER — Emergency Department (HOSPITAL_COMMUNITY)
Admission: EM | Admit: 2012-06-20 | Discharge: 2012-06-21 | Disposition: A | Discharge status: Home / Self Care | Payer: Self-pay | Attending: Emergency Medicine | Admitting: Emergency Medicine

## 2012-06-20 ENCOUNTER — Encounter (HOSPITAL_COMMUNITY): Payer: Self-pay | Admitting: Emergency Medicine

## 2012-06-20 ENCOUNTER — Emergency Department (HOSPITAL_COMMUNITY): Payer: Self-pay

## 2012-06-20 DIAGNOSIS — N764 Abscess of vulva: Secondary | ICD-10-CM | POA: Insufficient documentation

## 2012-06-20 DIAGNOSIS — Z8632 Personal history of gestational diabetes: Secondary | ICD-10-CM | POA: Insufficient documentation

## 2012-06-20 DIAGNOSIS — Z331 Pregnant state, incidental: Secondary | ICD-10-CM | POA: Insufficient documentation

## 2012-06-20 DIAGNOSIS — F17200 Nicotine dependence, unspecified, uncomplicated: Secondary | ICD-10-CM | POA: Insufficient documentation

## 2012-06-20 DIAGNOSIS — Z8679 Personal history of other diseases of the circulatory system: Secondary | ICD-10-CM | POA: Insufficient documentation

## 2012-06-20 DIAGNOSIS — Z8619 Personal history of other infectious and parasitic diseases: Secondary | ICD-10-CM | POA: Insufficient documentation

## 2012-06-20 NOTE — ED Provider Notes (Signed)
Seen here for labia abscess. Feels much improved since abscess drained in the emergency department. On exam patient alert not ill-appearing abdomen soft nontender unable to hear fetal heart tones. Patient have ultrasound of pelvis to check fetal viability  Doug Sou, MD 06/20/12 [phone]

## 2012-06-20 NOTE — ED Provider Notes (Signed)
History     CSN: [id number]  Arrival date & time 06/20/12  1839   First MD Initiated Contact with Patient 06/20/12 2105      Chief Complaint  Patient presents with  . Abscess    (Consider location/radiation/quality/duration/timing/severity/associated sxs/prior treatment) The history is provided by the patient and medical records.    Dorothy Meadows is a 27 y.o. female  with a hx of current pregnancy presents to the Emergency Department complaining of gradual, persistent, progressively worsening abscess onset yesterday. Pt is without a history of abscess. Associated symptoms include pain and swelling at the site.  Nothing makes it better and nothing makes it worse.  Pt denies fever, chills, headache, chest pain, shortness of breath, abdominal pain, nausea, vomiting, diarrhea, weakness, dizziness, syncope.  Past Medical History  Diagnosis Date  . Tachycardia   . Genital warts   . Gestational diabetes   . No pertinent past medical history     Past Surgical History  Procedure Laterality Date  . No past surgeries    . Removal of warts      Family History  Problem Relation Age of Onset  . Diabetes Mother   . Hypertension Mother   . Depression Mother   . Anxiety disorder Mother     History  Substance Use Topics  . Smoking status: Current Every Day Smoker -- 0.50 packs/day for 10 years    Types: Cigarettes  . Smokeless tobacco: Not on file  . Alcohol Use: No    OB History   Grav Para Term Preterm Abortions TAB SAB Ect Mult Living   4 2 2  1  1   2       Review of Systems  Constitutional: Negative for fever, diaphoresis, appetite change, fatigue and unexpected weight change.  HENT: Negative for mouth sores and neck stiffness.   Eyes: Negative for visual disturbance.  Respiratory: Negative for cough, chest tightness, shortness of breath and wheezing.   Cardiovascular: Negative for chest pain.  Gastrointestinal: Negative for nausea, vomiting, abdominal pain,  diarrhea and constipation.  Endocrine: Negative for polydipsia, polyphagia and polyuria.  Genitourinary: Negative for dysuria, urgency, frequency and hematuria.  Musculoskeletal: Negative for back pain.  Skin: Negative for rash.       Abscess  Allergic/Immunologic: Negative for immunocompromised state.  Neurological: Negative for syncope, light-headedness and headaches.  Hematological: Does not bruise/bleed easily.  Psychiatric/Behavioral: Negative for sleep disturbance. The patient is not nervous/anxious.   All other systems reviewed and are negative.    Allergies  Review of patient's allergies indicates no known allergies.  Home Medications   Current Outpatient Rx  Name  Route  Sig  Dispense  Refill  . Prenatal Vit-Fe Fumarate-FA (PRENATAL MULTIVITAMIN) TABS   Oral   Take 2 tablets by mouth daily.         . cephALEXin (KEFLEX) 500 MG capsule   Oral   Take 1 capsule (500 mg total) by mouth 4 (four) times daily.   40 capsule   0     BP 117/72  Pulse 89  Temp(Src) 98.2 F (36.8 C) (Oral)  Resp 16  SpO2 100%  LMP 02/19/2012  Physical Exam  Nursing note and vitals reviewed. Constitutional: She is oriented to person, place, and time. She appears well-developed and well-nourished. No distress.  HENT:  Head: Normocephalic and atraumatic.  Mouth/Throat: Oropharynx is clear and moist. No oropharyngeal exudate.  Eyes: Conjunctivae are normal. No scleral icterus.  Neck: Normal range of motion. Neck supple.  Cardiovascular: Normal rate, regular rhythm, normal heart sounds and intact distal pulses.  Exam reveals no gallop and no friction rub.   No murmur heard. Pulmonary/Chest: Effort normal and breath sounds normal. No respiratory distress. She has no wheezes. She has no rales. She exhibits no tenderness.  Abdominal: Soft. Bowel sounds are normal. She exhibits no mass. There is no tenderness. There is no rigidity, no rebound and no guarding.  Genitourinary:    No labial  fusion. There is tenderness and lesion on the right labia. There is no rash or injury on the right labia. There is no rash, tenderness, lesion or injury on the left labia.  Musculoskeletal: Normal range of motion. She exhibits no edema.  Lymphadenopathy:    She has no cervical adenopathy.  Neurological: She is alert and oriented to person, place, and time. She exhibits normal muscle tone. Coordination normal.  Speech is clear and goal oriented Moves extremities without ataxia  Skin: Skin is warm and dry. No rash noted. She is not diaphoretic. There is erythema. No pallor.  Psychiatric: She has a normal mood and affect.    ED Course  Procedures (including critical care time)  Labs Reviewed  GLUCOSE, CAPILLARY   US Ob Comp Less 14 Wks  06/21/2012  *RADIOLOGY REPORT*  Clinical Data: Unable to assess fetal heart tones.  OBSTETRIC <14 WK ULTRASOUND  Technique:  Transabdominal ultrasound was performed for evaluation of the gestation as well as the maternal uterus and adnexal regions.  Comparison:  05/08/2012  Intrauterine gestational sac: Visualized/normal in shape. Yolk sac: Not identified Embryo: Identified Cardiac Activity: Identified Heart Rate: 162 bpm  CRL:  62 mm  12 w  4 d        [name] EDC: 12/29/2012  Maternal uterus/Adnexae: No subchorionic hemorrhage.  Normal sonographic appearance to the ovaries.  No free fluid.  IMPRESSION: Single intrauterine gestation with cardiac activity documented. Estimated age of 12 weeks 4 days by crown-rump length.   Original Report Authenticated By: Jearld Lesch, M.D.      1. Abscess of labia   2. Normal IUP (intrauterine pregnancy) on prenatal ultrasound       MDM  Dorothy Meadows presents with labial abscess.  Physical exam this is not a Bartholin cyst.  Abscess is draining spontaneously and a large amount or purulent drainage is expressed.  No area of fluctuance remains after expression of fluid.  Do not believe the patient would benefit from I&D at  this time.  She is without risk factors for HIV, blood sugar normal here in the department and has not been on any immunosuppressive therapies. Patient with mild induration surrounding abscess. We'll discharge home with Keflex.  Unable to obtain fetal heart tones. Ultrasound findings a viable IUP at 12 weeks 4 days with cardiac activity documented.  Abscess was not large enough to warrant packing or drain,  wound recheck in 2 days. Encouraged home warm soaks and flushing.  Mild signs of cellulitis is surrounding skin.  Will d/c to home.  Have encouraged prenatal care with women[facility] outpatient clinic.  I have also discussed reasons to return immediately to the ER, particularly the [facility].  Patient expresses understanding and agrees with plan.   1. Medications: Keflex, usual home medications 2. Treatment: rest, drink plenty of fluids, warm compresses 3 times per day 3. Follow Up: Please followup with your primary doctor for discussion of your diagnoses and further evaluation after today's visit; if you do not have a primary care  doctor use the resource guide provided to find one; followup with [facility] hospital for prenatal care     [facility], PA-C 06/21/12 0049  Dierdre Forth, PA-C 06/21/12 0050

## 2012-06-20 NOTE — ED Notes (Signed)
Presents with 2 days of induration to labia golf ball sized, per pt. Denies drainage.

## 2012-06-21 MED ORDER — CEPHALEXIN 500 MG PO CAPS: 500.0000 mg | ORAL_CAPSULE | Freq: Four times a day (QID) | ORAL | Status: DC

## 2012-06-21 NOTE — ED Provider Notes (Signed)
Medical screening examination/treatment/procedure(s) were conducted as a shared visit with non-physician practitioner(s) and myself.  I personally evaluated the patient during the encounter  Doug Sou, MD 06/21/12 [phone]

## 2012-07-31 ENCOUNTER — Ambulatory Visit (INDEPENDENT_AMBULATORY_CARE_PROVIDER_SITE_OTHER): Payer: Self-pay | Admitting: Advanced Practice Midwife

## 2012-07-31 ENCOUNTER — Encounter: Payer: Self-pay | Admitting: Advanced Practice Midwife

## 2012-07-31 VITALS — BP 121/79 | Temp 98.8°F | Wt 229.0 lb

## 2012-07-31 DIAGNOSIS — Z3689 Encounter for other specified antenatal screening: Secondary | ICD-10-CM

## 2012-07-31 DIAGNOSIS — O0930 Supervision of pregnancy with insufficient antenatal care, unspecified trimester: Secondary | ICD-10-CM

## 2012-07-31 DIAGNOSIS — O2390 Unspecified genitourinary tract infection in pregnancy, unspecified trimester: Secondary | ICD-10-CM

## 2012-07-31 DIAGNOSIS — O0932 Supervision of pregnancy with insufficient antenatal care, second trimester: Secondary | ICD-10-CM

## 2012-07-31 DIAGNOSIS — O98211 Gonorrhea complicating pregnancy, first trimester: Secondary | ICD-10-CM

## 2012-07-31 DIAGNOSIS — O98219 Gonorrhea complicating pregnancy, unspecified trimester: Secondary | ICD-10-CM

## 2012-07-31 DIAGNOSIS — B951 Streptococcus, group B, as the cause of diseases classified elsewhere: Secondary | ICD-10-CM

## 2012-07-31 LAB — WET PREP, GENITAL
Clue Cells Wet Prep HPF POC: NONE SEEN
Trich, Wet Prep: NONE SEEN
WBC, Wet Prep HPF POC: NONE SEEN
Yeast Wet Prep HPF POC: NONE SEEN

## 2012-07-31 LAB — POCT URINALYSIS DIP (DEVICE)
Hgb urine dipstick: NEGATIVE
Ketones, ur: NEGATIVE mg/dL
Protein, ur: NEGATIVE mg/dL
Specific Gravity, Urine: 1.02 (ref 1.005–1.030)
Urobilinogen, UA: 0.2 mg/dL (ref 0.0–1.0)

## 2012-07-31 NOTE — Progress Notes (Signed)
Pulse- 82 Patient reports a lot of abdominal/pelvic pressure

## 2012-07-31 NOTE — Patient Instructions (Signed)
Pregnancy - Second Trimester The second trimester of pregnancy (3 to 6 months) is a period of rapid growth for you and your baby. At the end of the sixth month, your baby is about 9 inches long and weighs 1 1/2 pounds. You will begin to feel the baby move between 18 and 20 weeks of the pregnancy. This is called quickening. Weight gain is faster. A clear fluid (colostrum) may leak out of your breasts. You may feel small contractions of the womb (uterus). This is known as false labor or Braxton-Hicks contractions. This is like a practice for labor when the baby is ready to be born. Usually, the problems with morning sickness have usually passed by the end of your first trimester. Some women develop small dark blotches (called cholasma, mask of pregnancy) on their face that usually goes away after the baby is born. Exposure to the sun makes the blotches worse. Acne may also develop in some pregnant women and pregnant women who have acne, may find that it goes away. PRENATAL EXAMS  Blood work may continue to be done during prenatal exams. These tests are done to check on your health and the probable health of your baby. Blood work is used to follow your blood levels (hemoglobin). Anemia (low hemoglobin) is common during pregnancy. Iron and vitamins are given to help prevent this. You will also be checked for diabetes between 24 and 28 weeks of the pregnancy. Some of the previous blood tests may be repeated.  The size of the uterus is measured during each visit. This is to make sure that the baby is continuing to grow properly according to the dates of the pregnancy.  Your blood pressure is checked every prenatal visit. This is to make sure you are not getting toxemia.  Your urine is checked to make sure you do not have an infection, diabetes or protein in the urine.  Your weight is checked often to make sure gains are happening at the suggested rate. This is to ensure that both you and your baby are growing  normally.  Sometimes, an ultrasound is performed to confirm the proper growth and development of the baby. This is a test which bounces harmless sound waves off the baby so your caregiver can more accurately determine due dates. Sometimes, a specialized test is done on the amniotic fluid surrounding the baby. This test is called an amniocentesis. The amniotic fluid is obtained by sticking a needle into the belly (abdomen). This is done to check the chromosomes in instances where there is a concern about possible genetic problems with the baby. It is also sometimes done near the end of pregnancy if an early delivery is required. In this case, it is done to help make sure the baby's lungs are mature enough for the baby to live outside of the womb. CHANGES OCCURING IN THE SECOND TRIMESTER OF PREGNANCY Your body goes through many changes during pregnancy. They vary from person to person. Talk to your caregiver about changes you notice that you are concerned about.  During the second trimester, you will likely have an increase in your appetite. It is normal to have cravings for certain foods. This varies from person to person and pregnancy to pregnancy.  Your lower abdomen will begin to bulge.  You may have to urinate more often because the uterus and baby are pressing on your bladder. It is also common to get more bladder infections during pregnancy (pain with urination). You can help this by   drinking lots of fluids and emptying your bladder before and after intercourse.  You may begin to get stretch marks on your hips, abdomen, and breasts. These are normal changes in the body during pregnancy. There are no exercises or medications to take that prevent this change.  You may begin to develop swollen and bulging veins (varicose veins) in your legs. Wearing support hose, elevating your feet for 15 minutes, 3 to 4 times a day and limiting salt in your diet helps lessen the problem.  Heartburn may develop  as the uterus grows and pushes up against the stomach. Antacids recommended by your caregiver helps with this problem. Also, eating smaller meals 4 to 5 times a day helps.  Constipation can be treated with a stool softener or adding bulk to your diet. Drinking lots of fluids, vegetables, fruits, and whole grains are helpful.  Exercising is also helpful. If you have been very active up until your pregnancy, most of these activities can be continued during your pregnancy. If you have been less active, it is helpful to start an exercise program such as walking.  Hemorrhoids (varicose veins in the rectum) may develop at the end of the second trimester. Warm sitz baths and hemorrhoid cream recommended by your caregiver helps hemorrhoid problems.  Backaches may develop during this time of your pregnancy. Avoid heavy lifting, wear low heal shoes and practice good posture to help with backache problems.  Some pregnant women develop tingling and numbness of their hand and fingers because of swelling and tightening of ligaments in the wrist (carpel tunnel syndrome). This goes away after the baby is born.  As your breasts enlarge, you may have to get a bigger bra. Get a comfortable, cotton, support bra. Do not get a nursing bra until the last month of the pregnancy if you will be nursing the baby.  You may get a dark line from your belly button to the pubic area called the linea nigra.  You may develop rosy cheeks because of increase blood flow to the face.  You may develop spider looking lines of the face, neck, arms and chest. These go away after the baby is born. HOME CARE INSTRUCTIONS   It is extremely important to avoid all smoking, herbs, alcohol, and unprescribed drugs during your pregnancy. These chemicals affect the formation and growth of the baby. Avoid these chemicals throughout the pregnancy to ensure the delivery of a healthy infant.  Most of your home care instructions are the same as  suggested for the first trimester of your pregnancy. Keep your caregiver's appointments. Follow your caregiver's instructions regarding medication use, exercise and diet.  During pregnancy, you are providing food for you and your baby. Continue to eat regular, well-balanced meals. Choose foods such as meat, fish, milk and other low fat dairy products, vegetables, fruits, and whole-grain breads and cereals. Your caregiver will tell you of the ideal weight gain.  A physical sexual relationship may be continued up until near the end of pregnancy if there are no other problems. Problems could include early (premature) leaking of amniotic fluid from the membranes, vaginal bleeding, abdominal pain, or other medical or pregnancy problems.  Exercise regularly if there are no restrictions. Check with your caregiver if you are unsure of the safety of some of your exercises. The greatest weight gain will occur in the last 2 trimesters of pregnancy. Exercise will help you:  Control your weight.  Get you in shape for labor and delivery.  Lose weight   after you have the baby.  Wear a good support or jogging bra for breast tenderness during pregnancy. This may help if worn during sleep. Pads or tissues may be used in the bra if you are leaking colostrum.  Do not use hot tubs, steam rooms or saunas throughout the pregnancy.  Wear your seat belt at all times when driving. This protects you and your baby if you are in an accident.  Avoid raw meat, uncooked cheese, cat litter boxes and soil used by cats. These carry germs that can cause birth defects in the baby.  The second trimester is also a good time to visit your dentist for your dental health if this has not been done yet. Getting your teeth cleaned is OK. Use a soft toothbrush. Brush gently during pregnancy.  It is easier to loose urine during pregnancy. Tightening up and strengthening the pelvic muscles will help with this problem. Practice stopping your  urination while you are going to the bathroom. These are the same muscles you need to strengthen. It is also the muscles you would use as if you were trying to stop from passing gas. You can practice tightening these muscles up 10 times a set and repeating this about 3 times per day. Once you know what muscles to tighten up, do not perform these exercises during urination. It is more likely to contribute to an infection by backing up the urine.  Ask for help if you have financial, counseling or nutritional needs during pregnancy. Your caregiver will be able to offer counseling for these needs as well as refer you for other special needs.  Your skin may become oily. If so, wash your face with mild soap, use non-greasy moisturizer and oil or cream based makeup. MEDICATIONS AND DRUG USE IN PREGNANCY  Take prenatal vitamins as directed. The vitamin should contain 1 milligram of folic acid. Keep all vitamins out of reach of children. Only a couple vitamins or tablets containing iron may be fatal to a baby or young child when ingested.  Avoid use of all medications, including herbs, over-the-counter medications, not prescribed or suggested by your caregiver. Only take over-the-counter or prescription medicines for pain, discomfort, or fever as directed by your caregiver. Do not use aspirin.  Let your caregiver also know about herbs you may be using.  Alcohol is related to a number of birth defects. This includes fetal alcohol syndrome. All alcohol, in any form, should be avoided completely. Smoking will cause low birth rate and premature babies.  Street or illegal drugs are very harmful to the baby. They are absolutely forbidden. A baby born to an addicted mother will be addicted at birth. The baby will go through the same withdrawal an adult does. SEEK MEDICAL CARE IF:  You have any concerns or worries during your pregnancy. It is better to call with your questions if you feel they cannot wait, rather  than worry about them. SEEK IMMEDIATE MEDICAL CARE IF:   An unexplained oral temperature above 102 F (38.9 C) develops, or as your caregiver suggests.  You have leaking of fluid from the vagina (birth canal). If leaking membranes are suspected, take your temperature and tell your caregiver of this when you call.  There is vaginal spotting, bleeding, or passing clots. Tell your caregiver of the amount and how many pads are used. Light spotting in pregnancy is common, especially following intercourse.  You develop a bad smelling vaginal discharge with a change in the color from clear   to white.  You continue to feel sick to your stomach (nauseated) and have no relief from remedies suggested. You vomit blood or coffee ground-like materials.  You lose more than 2 pounds of weight or gain more than 2 pounds of weight over 1 week, or as suggested by your caregiver.  You notice swelling of your face, hands, feet, or legs.  You get exposed to German measles and have never had them.  You are exposed to fifth disease or chickenpox.  You develop belly (abdominal) pain. Round ligament discomfort is a common non-cancerous (benign) cause of abdominal pain in pregnancy. Your caregiver still must evaluate you.  You develop a bad headache that does not go away.  You develop fever, diarrhea, pain with urination, or shortness of breath.  You develop visual problems, blurry, or double vision.  You fall or are in a car accident or any kind of trauma.  There is mental or physical violence at home. Document Released: 04/11/2001 Document Revised: 07/10/2011 Document Reviewed: 10/14/2008 [employer] Patient Information 2013 [employer].  

## 2012-07-31 NOTE — Progress Notes (Signed)
See New OB note. States never took Rx for antibiotics given at last visit.   Subjective:    Dorothy Meadows is a [id number] [redacted]w[redacted]d being seen today for her first obstetrical visit.  Her obstetrical history is significant for Late to care, hx GC first trimester. Patient does intend to breast feed. Pregnancy history fully reviewed.  Patient reports no complaints.  Filed Vitals:   07/31/12 1021  BP: 121/79  Temp: 98.8 F (37.1 C)  Weight: 229 lb (103.874 kg)    HISTORY: OB History   Grav Para Term Preterm Abortions TAB SAB Ect Mult Living   4 2 2  0 1 0 1 0 0 2     # Outc Date GA Lbr Len/2nd Wgt Sex Del Anes PTL Lv   1 TRM 2/06 [redacted]w[redacted]d  6lb8oz(2.948kg) M VAC EPI  Yes   2 TRM 1/09 [redacted]w[redacted]d  6lb10oz(3.005kg) M SVD EPI  Yes   Comments: gestational diabetes    3 SAB            4 CUR              Past Medical History  Diagnosis Date  . Tachycardia   . Genital warts   . Gestational diabetes   . No pertinent past medical history    Past Surgical History  Procedure Laterality Date  . No past surgeries    . Removal of warts     Family History  Problem Relation Age of Onset  . Diabetes Mother   . Hypertension Mother   . Depression Mother   . Anxiety disorder Mother   . Diabetes Maternal Grandmother   . Hypertension Maternal Grandmother      Exam    Uterus:  Fundal Height: 18 cm  Pelvic Exam:    Perineum: No Hemorrhoids   Vulva: Bartholin's, Urethra, Skene's normal   Vagina:  normal mucosa, normal discharge   pH:    Cervix: no bleeding following Pap   Adnexa: normal adnexa and no mass, fullness, tenderness   Bony Pelvis: gynecoid  System: Breast:  normal appearance, no masses or tenderness   Skin: Abdomen covered with scars from MULTIPLE abscesses.      Neurologic: oriented, normal, grossly non-focal   Extremities: normal strength, tone, and muscle mass   HEENT neck supple with midline trachea   Mouth/Teeth mucous membranes moist, pharynx normal without lesions   Neck  supple and no masses   Cardiovascular: regular rate and rhythm   Respiratory:  appears well, vitals normal, no respiratory distress, acyanotic, normal RR, ear and throat exam is normal, neck free of mass or lymphadenopathy, chest clear, no wheezing, crepitations, rhonchi, normal symmetric air entry   Abdomen: soft, non-tender; bowel sounds normal; no masses,  no organomegaly   Urinary: urethral meatus normal      Assessment:    Pregnancy: [id number] Patient Active Problem List  Diagnosis  . Gonorrhea complicating pregnancy in first trimester        Plan:     Initial labs drawn. Prenatal vitamins. Problem list reviewed and updated. Genetic Screening discussed Quad Screen: ordered.  Ultrasound discussed; fetal survey: ordered.  Follow up in 4 weeks. 50% of 30 min visit spent on counseling and coordination of care.  Test of cure GC/Ch and wet prep sent    [facility] 07/31/2012

## 2012-08-01 ENCOUNTER — Telehealth: Payer: Self-pay | Admitting: *Deleted

## 2012-08-01 LAB — OBSTETRIC PANEL
Antibody Screen: NEGATIVE
Basophils Absolute: 0 10*3/uL (ref 0.0–0.1)
Eosinophils Relative: 1 % (ref 0–5)
HCT: 35.3 % — ABNORMAL LOW (ref 36.0–46.0)
Hemoglobin: 11.9 g/dL — ABNORMAL LOW (ref 12.0–15.0)
Lymphocytes Relative: 14 % (ref 12–46)
MCHC: 33.7 g/dL (ref 30.0–36.0)
MCV: 89.1 fL (ref 78.0–100.0)
Monocytes Absolute: 0.6 10*3/uL (ref 0.1–1.0)
Monocytes Relative: 4 % (ref 3–12)
Neutro Abs: 12.7 10*3/uL — ABNORMAL HIGH (ref 1.7–7.7)
RDW: 14.8 % (ref 11.5–15.5)
Rh Type: POSITIVE
Rubella: 3.07 Index — ABNORMAL HIGH (ref <0.90)
WBC: 15.7 10*3/uL — ABNORMAL HIGH (ref 4.0–10.5)

## 2012-08-01 LAB — GLUCOSE TOLERANCE, 1 HOUR (50G) W/O FASTING: Glucose, 1 Hour GTT: 139 mg/dL (ref 70–140)

## 2012-08-01 LAB — HIV ANTIBODY (ROUTINE TESTING W REFLEX): HIV: NONREACTIVE

## 2012-08-01 NOTE — Telephone Encounter (Addendum)
Message copied by Jill Side on Thu Aug 01, 2012  9:41 AM ------      Message from: Nara Visa, [place] L      Created: Thu Aug 01, 2012  9:04 AM       Glucola 139.  Needs 3 hour GTT. ------ Called pt and informed her of abnormal 1hr GTT- now requires 3hr GTT to determine if she has Gestational Diabetes. Pt voiced understanding and agreed to have test on 4/8 @ 0830. Pre-test instructions given.

## 2012-08-02 LAB — CULTURE, OB URINE

## 2012-08-05 ENCOUNTER — Telehealth: Payer: Self-pay | Admitting: General Practice

## 2012-08-05 ENCOUNTER — Ambulatory Visit (HOSPITAL_COMMUNITY)
Admission: RE | Admit: 2012-08-05 | Discharge: 2012-08-05 | Disposition: A | Discharge status: Home / Self Care | Payer: Medicaid Other | Source: Ambulatory Visit | Attending: Advanced Practice Midwife | Admitting: Advanced Practice Midwife

## 2012-08-05 ENCOUNTER — Encounter: Payer: Self-pay | Admitting: Advanced Practice Midwife

## 2012-08-05 DIAGNOSIS — B951 Streptococcus, group B, as the cause of diseases classified elsewhere: Secondary | ICD-10-CM | POA: Insufficient documentation

## 2012-08-05 DIAGNOSIS — O09299 Supervision of pregnancy with other poor reproductive or obstetric history, unspecified trimester: Secondary | ICD-10-CM | POA: Insufficient documentation

## 2012-08-05 DIAGNOSIS — O0930 Supervision of pregnancy with insufficient antenatal care, unspecified trimester: Secondary | ICD-10-CM | POA: Insufficient documentation

## 2012-08-05 DIAGNOSIS — O4440 Low lying placenta NOS or without hemorrhage, unspecified trimester: Secondary | ICD-10-CM | POA: Insufficient documentation

## 2012-08-05 DIAGNOSIS — Z363 Encounter for antenatal screening for malformations: Secondary | ICD-10-CM | POA: Insufficient documentation

## 2012-08-05 DIAGNOSIS — O283 Abnormal ultrasonic finding on antenatal screening of mother: Secondary | ICD-10-CM | POA: Insufficient documentation

## 2012-08-05 DIAGNOSIS — O99330 Smoking (tobacco) complicating pregnancy, unspecified trimester: Secondary | ICD-10-CM | POA: Insufficient documentation

## 2012-08-05 DIAGNOSIS — Z1389 Encounter for screening for other disorder: Secondary | ICD-10-CM | POA: Insufficient documentation

## 2012-08-05 DIAGNOSIS — Z3689 Encounter for other specified antenatal screening: Secondary | ICD-10-CM

## 2012-08-05 DIAGNOSIS — O358XX0 Maternal care for other (suspected) fetal abnormality and damage, not applicable or unspecified: Secondary | ICD-10-CM | POA: Insufficient documentation

## 2012-08-05 MED ORDER — PENICILLIN V POTASSIUM 500 MG PO TABS: 500.0000 mg | ORAL_TABLET | Freq: Four times a day (QID) | ORAL | Status: DC

## 2012-08-05 NOTE — Addendum Note (Signed)
Addended by: Aviva Signs on: 08/05/2012 03:15 AM   Modules accepted: Orders

## 2012-08-05 NOTE — Telephone Encounter (Signed)
Message copied by Kathee Delton on Mon Aug 05, 2012 11:20 AM ------      Message from: Wynelle Bourgeois L      Created: Mon Aug 05, 2012  3:13 AM      Regarding: gbs urine       GBS in urine      I put in a Rx for PCN            Hilda Lias ------

## 2012-08-05 NOTE — Telephone Encounter (Signed)
Called patient at only listed number and message stated this is not a valid mailbox. Patient coming tomorrow for 3 hr gtt, will inform patient at this time.

## 2012-08-06 ENCOUNTER — Other Ambulatory Visit: Payer: Self-pay

## 2012-08-06 DIAGNOSIS — R7309 Other abnormal glucose: Secondary | ICD-10-CM

## 2012-08-06 LAB — GLUCOSE TOLERANCE, 3 HOURS: Glucose Tolerance, 2 hour: 84 mg/dL (ref 70–164)

## 2012-08-07 NOTE — Telephone Encounter (Signed)
Called patient and notified of GBS result and PCN sent to pharm on file. Patient agrees and satisfied.

## 2012-08-08 ENCOUNTER — Encounter: Payer: Self-pay | Admitting: Obstetrics & Gynecology

## 2012-08-12 ENCOUNTER — Telehealth: Payer: Self-pay

## 2012-08-12 NOTE — Telephone Encounter (Signed)
Called pt and left message that the drink that she took on the 07/31/12 came back normal and if she has any questions to please give [name] call back to the clinics.

## 2012-08-12 NOTE — Telephone Encounter (Signed)
Message copied by Faythe Casa on Mon Aug 12, 2012  8:59 AM ------      Message from: Jaynie Collins A      Created: Thu Aug 08, 2012  2:07 PM       Normal 3 hour GTT. Please inform patient. ------

## 2012-08-28 ENCOUNTER — Other Ambulatory Visit: Payer: Self-pay | Admitting: Advanced Practice Midwife

## 2012-08-28 ENCOUNTER — Ambulatory Visit (INDEPENDENT_AMBULATORY_CARE_PROVIDER_SITE_OTHER): Payer: Medicaid Other | Admitting: Obstetrics & Gynecology

## 2012-08-28 VITALS — BP 113/72 | Temp 98.2°F | Wt 231.1 lb

## 2012-08-28 DIAGNOSIS — O98211 Gonorrhea complicating pregnancy, first trimester: Secondary | ICD-10-CM

## 2012-08-28 DIAGNOSIS — O98219 Gonorrhea complicating pregnancy, unspecified trimester: Secondary | ICD-10-CM

## 2012-08-28 DIAGNOSIS — A5403 Gonococcal cervicitis, unspecified: Secondary | ICD-10-CM

## 2012-08-28 DIAGNOSIS — O358XX0 Maternal care for other (suspected) fetal abnormality and damage, not applicable or unspecified: Secondary | ICD-10-CM

## 2012-08-28 DIAGNOSIS — B951 Streptococcus, group B, as the cause of diseases classified elsewhere: Secondary | ICD-10-CM

## 2012-08-28 DIAGNOSIS — O2390 Unspecified genitourinary tract infection in pregnancy, unspecified trimester: Secondary | ICD-10-CM

## 2012-08-28 DIAGNOSIS — Z1389 Encounter for screening for other disorder: Secondary | ICD-10-CM

## 2012-08-28 LAB — POCT URINALYSIS DIP (DEVICE)
Bilirubin Urine: NEGATIVE
Hgb urine dipstick: NEGATIVE
Ketones, ur: NEGATIVE mg/dL
Protein, ur: NEGATIVE mg/dL
Specific Gravity, Urine: 1.015 (ref 1.005–1.030)
pH: 8.5 — ABNORMAL HIGH (ref 5.0–8.0)

## 2012-08-28 MED ORDER — CEFTRIAXONE SODIUM 250 MG IJ SOLR: 250.0000 mg | Freq: Once | INTRAMUSCULAR | Status: DC

## 2012-08-28 MED ORDER — CEFTRIAXONE SODIUM 1 G IJ SOLR
250.0000 mg | Freq: Once | INTRAMUSCULAR | Status: AC
  Administered 2012-08-28: 250 mg via INTRAMUSCULAR

## 2012-08-28 MED ORDER — AZITHROMYCIN 250 MG PO TABS: 250.0000 mg | ORAL_TABLET | Freq: Once | ORAL | Status: DC

## 2012-08-28 MED ORDER — AZITHROMYCIN 1 G PO PACK
1.0000 g | PACK | Freq: Once | ORAL | Status: AC
  Administered 2012-08-28: 1 g via ORAL

## 2012-08-28 NOTE — Progress Notes (Signed)
Pulse- 73  Pain-left side pain Pt c/o constipation

## 2012-08-28 NOTE — Patient Instructions (Addendum)
Group B Streptococcus Infection During Pregnancy Group B streptococcus (GBS) is a type of bacteria often found in healthy women. GBS usually does not cause any symptoms or harm to healthy adult women, but the bacteria can make a baby very sick if it is passed to the baby during childbirth. GBS is not a sexually transmitted disease (STD). GBS is different from Group A streptococcus, the bacteria that causes "strep throat." CAUSES  GBS bacteria can be found in the intestinal, reproductive, and urinary tracts of women. It can also be found in the female genital tract, most often in the vagina and rectal areas.  SYMPTOMS  In pregnancy, GBS can be in the following places:  Genital tract without symptoms.  Rectum without symptoms.  Urine with or without symptoms (asymptomatic bacteriuria).  Urinary symptoms can include pain, frequency, urgency, and blood with urination (cystitis). Pregnant women who are infected with GBS are at increased risk of:  Early (premature) labor and delivery.  Prolonged rupture of the membranes.  Infection in the following places:  Bladder.  Kidneys (pyelonephritis).  Bag of waters or placenta (chorioamnionitis).  Uterus (endometritis) after delivery. Newborns who are infected with GBS can develop:  Lung infection (pneumonia).  Blood infection (septicemia).  Infection of the lining of the brain and spinal cord (meningitis). DIAGNOSIS  Diagnosis of GBS infection is made by screening tests done when you are 35 to [redacted] weeks pregnant. The test (culture) is an easy swab of the vagina and rectum. A sample of your urine might also be checked for the bacteria. Talk with your caregiver about a plan for labor if your test shows that you carry the GBS bacteria. TREATMENT  If results of the culture are positive, showing that GBS is present, you likely will receive treatment with antibiotic medicines during labor. This will help prevent GBS from being passed to your baby.  The antibiotics work only if they are given during labor. If treatment is given earlier in pregnancy, the bacteria may regrow and be present during labor. Tell your caregiver if you are allergic to penicillin or other antibiotics. Antibiotics are also given if:   Infection of the membranes (amnionitis) is suspected.  Labor begins or there is rupture of the membranes before 37 weeks of pregnancy and there is a high risk of delivering the baby.  The mother has a past history of giving birth to an infant with GBS infection.  The culture status is unknown (culture not performed or result not available) and there is:  Fever during labor.  Preterm labor (less than 37 weeks of pregnancy).  Prolonged rupture of membranes (18 hours or more). Treatment of the mother during labor is not recommended when:  A planned cesarean delivery is done and there is no labor or ruptured membranes. This is true even if the mother tested positive for GBS.  There is a negative culture for GBS screening during the pregnancy, regardless of the risk factors during labor. The infant will receive antibiotics if the infant tested positive for GBS or has signs and symptoms that suggest GBS infection is present. It is not recommended to routinely give antibiotics to infants whose mothers received antibiotic treatment during labor. HOME CARE INSTRUCTIONS   Take all antibiotics as prescribed by your caregiver.  Only take medicine as directed by your caregiver.  Continue with prenatal visits and care.  Return for follow-up appointments and cultures.  Follow your caregiver's instructions. SEEK MEDICAL CARE IF:   You have pain with urination.    You have frequent urination.  You have blood in your urine. SEEK IMMEDIATE MEDICAL CARE IF:  You have a fever.  You have pain in the back, side, or uterus.  You have chills.  You have abdominal swelling (distension) or pain.  You have labor pains (contractions) every  10 minutes or more often.  You are leaking fluid or bleeding from your vagina.  You have pelvic pressure that feels like your baby is pushing down.  You have a low, dull backache.  You have cramps that feel like your period.  You have abdominal cramps with or without diarrhea.  You have repeated vomiting and diarrhea.  You have trouble breathing.  You develop confusion.  You have stiffness of your body or neck. MAKE SURE YOU:   Understand these instructions.  Will watch your condition.  Will get help right away if you are not doing well or get worse. Document Released: 07/25/2007 Document Revised: 07/10/2011 Document Reviewed: 08/28/2010 [facility] Patient Information 2013 [place], [place]. Gestational Diabetes Mellitus Gestational diabetes mellitus (GDM) is diabetes that occurs only during pregnancy. This happens when the body cannot properly handle the glucose (sugar) that increases in the blood after eating. During pregnancy, insulin resistance (reduced sensitivity to insulin) occurs because of the release of hormones from the placenta. Usually, the pancreas of pregnant women produces enough insulin to overcome the resistance that occurs. However, in gestational diabetes, the insulin is there but it does not work effectively. If the resistance is severe enough that the pancreas does not produce enough insulin, extra glucose builds up in the blood.  WHO IS AT RISK FOR DEVELOPING GESTATIONAL DIABETES?  Women with a history of diabetes in the family.  Women over age 30.  Women who are overweight.  Women in certain ethnic groups (Hispanic, African American, Native American, [place] and [place] Islander). WHAT CAN HAPPEN TO THE BABY? If the mother's blood glucose is too high while she is pregnant, the extra sugar will travel through the umbilical cord to the baby. Some of the problems the baby may have are:  Large Baby - If the baby receives too much sugar, the baby will gain more  weight. This may cause the baby to be too large to be born normally (vaginally) and a Cesarean section (C-section) may be needed.  Low Blood Glucose (hypoglycemia)  The baby makes extra insulin, in response to the extra sugar its gets from its mother. When the baby is born and no longer needs this extra insulin, the baby's blood glucose level may drop.  Jaundice (yellow coloring of the skin and eyes)  This is fairly common in babies. It is caused from a build-up of the chemical called bilirubin. This is rarely serious, but is seen more often in babies whose mothers had gestational diabetes. RISKS TO THE MOTHER Women who have had gestational diabetes may be at higher risk for some problems, including:  Preeclampsia or toxemia, which includes problems with high blood pressure. Blood pressure and protein levels in the urine must be checked frequently.  Infections.  Cesarean section (C-section) for delivery.  Developing Type 2 diabetes later in life. About 30-50% will develop diabetes later, especially if obese. DIAGNOSIS  The hormones that cause insulin resistance are highest at about 24-28 weeks of pregnancy. If symptoms are experienced, they are much like symptoms you would normally expect during pregnancy.  GDM is often diagnosed using a two part method: 1. After 24-28 weeks of pregnancy, the woman drinks a glucose solution and  takes a blood test. If the glucose level is high, a second test will be given. 2. Oral Glucose Tolerance Test (OGTT) which is 3 hours long  After not eating overnight, the blood glucose is checked. The woman drinks a glucose solution, and hourly blood glucose tests are taken. If the woman has risk factors for GDM, the caregiver may test earlier than 24 weeks of pregnancy. TREATMENT  Treatment of GDM is directed at keeping the mother's blood glucose level normal, and may include:  Meal planning.  Taking insulin or other medicine to control your blood glucose  level.  Exercise.  Keeping a daily record of the foods you eat.  Blood glucose monitoring and keeping a record of your blood glucose levels.  May monitor ketone levels in the urine, although this is no longer considered necessary in most pregnancies. HOME CARE INSTRUCTIONS  While you are pregnant:  Follow your caregiver's advice regarding your prenatal appointments, meal planning, exercise, medicines, vitamins, blood and other tests, and physical activities.  Keep a record of your meals, blood glucose tests, and the amount of insulin you are taking (if any). Show this to your caregiver at every prenatal visit.  If you have GDM, you may have problems with hypoglycemia (low blood glucose). You may suspect this if you become suddenly dizzy, feel shaky, and/or weak. If you think this is happening and you have a glucose meter, try to test your blood glucose level. Follow your caregiver's advice for when and how to treat your low blood glucose. Generally, the 15:15 rule is followed: Treat by consuming 15 grams of carbohydrates, wait 15 minutes, and recheck blood glucose. Examples of 15 grams of carbohydrates are:  1 cup skim or low-fat milk.   cup juice.  3-4 glucose tablets.  5-6 hard candies.  1 small box raisins.   cup regular soda pop.  Practice good hygiene, to avoid infections.  Do not smoke. SEEK MEDICAL CARE IF:   You develop abnormal vaginal discharge, with or without itching.  You become weak and tired more than expected.  You seem to sweat a lot.  You have a sudden increase in weight, 5 pounds or more in one week.  You are losing weight, 3 pounds or more in a week.  Your blood glucose level is high, and you need instructions on what to do about it. SEEK IMMEDIATE MEDICAL CARE IF:   You develop a severe headache.  You faint or pass out.  You develop nausea and vomiting.  You become disoriented or confused.  You have a convulsion.  You develop vision  problems.  You develop stomach pain.  You develop vaginal bleeding.  You develop uterine contractions.  You have leaking or a gush of fluid from the vagina. AFTER YOU HAVE THE BABY:  Go to all of your follow-up appointments, and have blood tests as advised by your caregiver.  Maintain a healthy lifestyle, to prevent diabetes in the future. This includes:  Following a healthy meal plan.  Controlling your weight.  Getting enough exercise and proper rest.  Do not smoke.  Breastfeed your baby if you can. This will lower the chance of you and your baby developing diabetes later in life. For more information about diabetes, go to the American Diabetes Association at: PMFashions.com.cy. For more information about gestational diabetes, go to the [employer] at: RentRule.com.au. Document Released: 07/24/2000 Document Revised: 07/10/2011 Document Reviewed: 02/15/2009 [facility] Patient Information 2013 [place], [place].

## 2012-08-28 NOTE — Progress Notes (Signed)
D/w pt +GBS and the implications in labor +GC (initially dx/d in 03/2012- no record of tx pt denies tx at another facility) for Rocephin 250mg  and Azithromax 1 gram po  Questionable fetal pyelectasis- repeat sono Reviewed with pt abnormal Glc and need for 3 hr at 28 weeks (h/o GDM in a prior pregnancy) F/u 4 weeks

## 2012-09-11 ENCOUNTER — Ambulatory Visit (HOSPITAL_COMMUNITY)
Admission: RE | Admit: 2012-09-11 | Discharge: 2012-09-11 | Disposition: A | Discharge status: Home / Self Care | Payer: Medicaid Other | Source: Ambulatory Visit | Attending: Obstetrics & Gynecology | Admitting: Obstetrics & Gynecology

## 2012-09-11 ENCOUNTER — Other Ambulatory Visit: Payer: Self-pay | Admitting: Obstetrics & Gynecology

## 2012-09-11 DIAGNOSIS — O35EXX0 Maternal care for other (suspected) fetal abnormality and damage, fetal genitourinary anomalies, not applicable or unspecified: Secondary | ICD-10-CM

## 2012-09-11 DIAGNOSIS — O4400 Placenta previa specified as without hemorrhage, unspecified trimester: Secondary | ICD-10-CM | POA: Insufficient documentation

## 2012-09-11 DIAGNOSIS — Z3689 Encounter for other specified antenatal screening: Secondary | ICD-10-CM | POA: Insufficient documentation

## 2012-09-11 DIAGNOSIS — O358XX0 Maternal care for other (suspected) fetal abnormality and damage, not applicable or unspecified: Secondary | ICD-10-CM | POA: Insufficient documentation

## 2012-09-11 DIAGNOSIS — Z363 Encounter for antenatal screening for malformations: Secondary | ICD-10-CM | POA: Insufficient documentation

## 2012-09-11 DIAGNOSIS — Z1389 Encounter for screening for other disorder: Secondary | ICD-10-CM | POA: Insufficient documentation

## 2012-09-25 ENCOUNTER — Encounter: Payer: Self-pay | Admitting: Family Medicine

## 2012-10-09 ENCOUNTER — Ambulatory Visit (INDEPENDENT_AMBULATORY_CARE_PROVIDER_SITE_OTHER): Payer: Medicaid Other | Admitting: Obstetrics and Gynecology

## 2012-10-09 ENCOUNTER — Encounter: Payer: Self-pay | Admitting: Obstetrics and Gynecology

## 2012-10-09 VITALS — BP 119/77 | Wt 236.6 lb

## 2012-10-09 DIAGNOSIS — Z3480 Encounter for supervision of other normal pregnancy, unspecified trimester: Secondary | ICD-10-CM

## 2012-10-09 DIAGNOSIS — Z3483 Encounter for supervision of other normal pregnancy, third trimester: Secondary | ICD-10-CM

## 2012-10-09 LAB — POCT URINALYSIS DIP (DEVICE)
Bilirubin Urine: NEGATIVE
Glucose, UA: NEGATIVE mg/dL
Hgb urine dipstick: NEGATIVE
Nitrite: NEGATIVE

## 2012-10-09 NOTE — Progress Notes (Signed)
Doing well. Constipation better. F/U scan shows 6mm left fetal renal pyelectasis> F/U at 34 wks. Not fasting today so will return for repeat 3 hr OGTT.

## 2012-10-09 NOTE — Progress Notes (Signed)
Pulse: 88 Per Dr. Burnice Logan Smith's last note patient needs 3hr gtt @ 28 weeks. Pt not fasting today.

## 2012-10-09 NOTE — Patient Instructions (Signed)

## 2012-10-11 ENCOUNTER — Other Ambulatory Visit: Payer: Self-pay

## 2012-10-11 DIAGNOSIS — O98211 Gonorrhea complicating pregnancy, first trimester: Secondary | ICD-10-CM

## 2012-10-11 DIAGNOSIS — R7309 Other abnormal glucose: Secondary | ICD-10-CM

## 2012-10-12 LAB — GLUCOSE TOLERANCE, 3 HOURS
Glucose Tolerance, 1 hour: 148 mg/dL (ref 70–189)
Glucose Tolerance, 2 hour: 135 mg/dL (ref 70–164)
Glucose Tolerance, Fasting: 81 mg/dL (ref 70–104)

## 2012-10-16 ENCOUNTER — Encounter: Payer: Self-pay | Admitting: Advanced Practice Midwife

## 2012-10-16 ENCOUNTER — Ambulatory Visit (INDEPENDENT_AMBULATORY_CARE_PROVIDER_SITE_OTHER): Payer: Medicaid Other | Admitting: Advanced Practice Midwife

## 2012-10-16 VITALS — BP 121/74 | Temp 97.7°F | Wt 238.7 lb

## 2012-10-16 DIAGNOSIS — B951 Streptococcus, group B, as the cause of diseases classified elsewhere: Secondary | ICD-10-CM

## 2012-10-16 DIAGNOSIS — O09299 Supervision of pregnancy with other poor reproductive or obstetric history, unspecified trimester: Secondary | ICD-10-CM

## 2012-10-16 DIAGNOSIS — N390 Urinary tract infection, site not specified: Secondary | ICD-10-CM

## 2012-10-16 DIAGNOSIS — O2390 Unspecified genitourinary tract infection in pregnancy, unspecified trimester: Secondary | ICD-10-CM

## 2012-10-16 DIAGNOSIS — O09893 Supervision of other high risk pregnancies, third trimester: Secondary | ICD-10-CM

## 2012-10-16 LAB — POCT URINALYSIS DIP (DEVICE)
Leukocytes, UA: NEGATIVE
Nitrite: NEGATIVE
Protein, ur: 30 mg/dL — AB
Urobilinogen, UA: 0.2 mg/dL (ref 0.0–1.0)
pH: 6 (ref 5.0–8.0)

## 2012-10-16 NOTE — Progress Notes (Signed)
Pulse- 97  Pain- pelvic pain

## 2012-10-16 NOTE — Progress Notes (Signed)
Test of cure for GC done on urine done today. Pt states was treated about a month ago. Informed her the 3 hr GTT was normal.

## 2012-10-16 NOTE — Patient Instructions (Addendum)

## 2012-11-14 ENCOUNTER — Ambulatory Visit (INDEPENDENT_AMBULATORY_CARE_PROVIDER_SITE_OTHER): Payer: Medicaid Other | Admitting: Family Medicine

## 2012-11-14 ENCOUNTER — Encounter: Payer: Self-pay | Admitting: Family Medicine

## 2012-11-14 VITALS — BP 131/80 | Temp 97.0°F | Wt 233.7 lb

## 2012-11-14 DIAGNOSIS — O289 Unspecified abnormal findings on antenatal screening of mother: Secondary | ICD-10-CM

## 2012-11-14 DIAGNOSIS — O283 Abnormal ultrasonic finding on antenatal screening of mother: Secondary | ICD-10-CM

## 2012-11-14 DIAGNOSIS — Z23 Encounter for immunization: Secondary | ICD-10-CM

## 2012-11-14 DIAGNOSIS — K219 Gastro-esophageal reflux disease without esophagitis: Secondary | ICD-10-CM

## 2012-11-14 DIAGNOSIS — O4400 Placenta previa specified as without hemorrhage, unspecified trimester: Secondary | ICD-10-CM

## 2012-11-14 HISTORY — DX: Gastro-esophageal reflux disease without esophagitis: K21.9

## 2012-11-14 LAB — POCT URINALYSIS DIP (DEVICE)
Hgb urine dipstick: NEGATIVE
Nitrite: NEGATIVE
Protein, ur: NEGATIVE mg/dL
Urobilinogen, UA: 1 mg/dL (ref 0.0–1.0)
pH: 7 (ref 5.0–8.0)

## 2012-11-14 MED ORDER — OMEPRAZOLE MAGNESIUM 20 MG PO TBEC: 20.0000 mg | DELAYED_RELEASE_TABLET | Freq: Every day | ORAL | Status: DC

## 2012-11-14 MED ORDER — TETANUS-DIPHTH-ACELL PERTUSSIS 5-2.5-18.5 LF-MCG/0.5 IM SUSP
0.5000 mL | Freq: Once | INTRAMUSCULAR | Status: AC
  Administered 2012-11-14: 0.5 mL via INTRAMUSCULAR

## 2012-11-14 NOTE — Addendum Note (Signed)
Addended by: Toula Moos on: 11/14/2012 04:14 PM   Modules accepted: Orders

## 2012-11-14 NOTE — Patient Instructions (Signed)
Pregnancy - Third Trimester The third trimester of pregnancy (the last 3 months) is a period of the most rapid growth for you and your baby. The baby approaches a length of 20 inches and a weight of 6 to 10 pounds. The baby is adding on fat and getting ready for life outside your body. While inside, babies have periods of sleeping and waking, sucking thumbs, and hiccuping. You can often feel small contractions of the uterus. This is false labor. It is also called Braxton-Hicks contractions. This is like a practice for labor. The usual problems in this stage of pregnancy include more difficulty breathing, swelling of the hands and feet from water retention, and having to urinate more often because of the uterus and baby pressing on your bladder.  PRENATAL EXAMS  Blood work may continue to be done during prenatal exams. These tests are done to check on your health and the probable health of your baby. Blood work is used to follow your blood levels (hemoglobin). Anemia (low hemoglobin) is common during pregnancy. Iron and vitamins are given to help prevent this. You may also continue to be checked for diabetes. Some of the past blood tests may be done again.  The size of the uterus is measured during each visit. This makes sure your baby is growing properly according to your pregnancy dates.  Your blood pressure is checked every prenatal visit. This is to make sure you are not getting toxemia.  Your urine is checked every prenatal visit for infection, diabetes, and protein.  Your weight is checked at each visit. This is done to make sure gains are happening at the suggested rate and that you and your baby are growing normally.  Sometimes, an ultrasound is performed to confirm the position and the proper growth and development of the baby. This is a test done that bounces harmless sound waves off the baby so your caregiver can more accurately determine a due date.  Discuss the type of pain medicine and  anesthesia you will have during your labor and delivery.  Discuss the possibility and anesthesia if a cesarean section might be necessary.  Inform your caregiver if there is any mental or physical violence at home. Sometimes, a specialized non-stress test, contraction stress test, and biophysical profile are done to make sure the baby is not having a problem. Checking the amniotic fluid surrounding the baby is called an amniocentesis. The amniotic fluid is removed by sticking a needle into the belly (abdomen). This is sometimes done near the end of pregnancy if an early delivery is required. In this case, it is done to help make sure the baby's lungs are mature enough for the baby to live outside of the womb. If the lungs are not mature and it is unsafe to deliver the baby, an injection of cortisone medicine is given to the mother 1 to 2 days before the delivery. This helps the baby's lungs mature and makes it safer to deliver the baby. CHANGES OCCURING IN THE THIRD TRIMESTER OF PREGNANCY Your body goes through many changes during pregnancy. They vary from person to person. Talk to your caregiver about changes you notice and are concerned about.  During the last trimester, you have probably had an increase in your appetite. It is normal to have cravings for certain foods. This varies from person to person and pregnancy to pregnancy.  You may begin to get stretch marks on your hips, abdomen, and breasts. These are normal changes in the body   during pregnancy. There are no exercises or medicines to take which prevent this change.  Constipation may be treated with a stool softener or adding bulk to your diet. Drinking lots of fluids, fiber in vegetables, fruits, and whole grains are helpful.  Exercising is also helpful. If you have been very active up until your pregnancy, most of these activities can be continued during your pregnancy. If you have been less active, it is helpful to start an exercise  program such as walking. Consult your caregiver before starting exercise programs.  Avoid all smoking, alcohol, non-prescribed drugs, herbs and "street drugs" during your pregnancy. These chemicals affect the formation and growth of the baby. Avoid chemicals throughout the pregnancy to ensure the delivery of a healthy infant.  Backache, varicose veins, and hemorrhoids may develop or get worse.  You will tire more easily in the third trimester, which is normal.  The baby's movements may be stronger and more often.  You may become short of breath easily.  Your belly button may stick out.  A yellow discharge may leak from your breasts called colostrum.  You may have a bloody mucus discharge. This usually occurs a few days to a week before labor begins. HOME CARE INSTRUCTIONS   Keep your caregiver's appointments. Follow your caregiver's instructions regarding medicine use, exercise, and diet.  During pregnancy, you are providing food for you and your baby. Continue to eat regular, well-balanced meals. Choose foods such as meat, fish, milk and other low fat dairy products, vegetables, fruits, and whole-grain breads and cereals. Your caregiver will tell you of the ideal weight gain.  A physical sexual relationship may be continued throughout pregnancy if there are no other problems such as early (premature) leaking of amniotic fluid from the membranes, vaginal bleeding, or belly (abdominal) pain.  Exercise regularly if there are no restrictions. Check with your caregiver if you are unsure of the safety of your exercises. Greater weight gain will occur in the last 2 trimesters of pregnancy. Exercising helps:  Control your weight.  Get you in shape for labor and delivery.  You lose weight after you deliver.  Rest a lot with legs elevated, or as needed for leg cramps or low back pain.  Wear a good support or jogging bra for breast tenderness during pregnancy. This may help if worn during  sleep. Pads or tissues may be used in the bra if you are leaking colostrum.  Do not use hot tubs, steam rooms, or saunas.  Wear your seat belt when driving. This protects you and your baby if you are in an accident.  Avoid raw meat, cat litter boxes and soil used by cats. These carry germs that can cause birth defects in the baby.  It is easier to leak urine during pregnancy. Tightening up and strengthening the pelvic muscles will help with this problem. You can practice stopping your urination while you are going to the bathroom. These are the same muscles you need to strengthen. It is also the muscles you would use if you were trying to stop from passing gas. You can practice tightening these muscles up 10 times a set and repeating this about 3 times per day. Once you know what muscles to tighten up, do not perform these exercises during urination. It is more likely to cause an infection by backing up the urine.  Ask for help if you have financial, counseling, or nutritional needs during pregnancy. Your caregiver will be able to offer counseling for these   needs as well as refer you for other special needs.  Make a list of emergency phone numbers and have them available.  Plan on getting help from family or friends when you go home from the hospital.  Make a trial run to the hospital.  Take prenatal classes with the father to understand, practice, and ask questions about the labor and delivery.  Prepare the baby's room or nursery.  Do not travel out of the city unless it is absolutely necessary and with the advice of your caregiver.  Wear only low or no heal shoes to have better balance and prevent falling. MEDICINES AND DRUG USE IN PREGNANCY  Take prenatal vitamins as directed. The vitamin should contain 1 milligram of folic acid. Keep all vitamins out of reach of children. Only a couple vitamins or tablets containing iron may be fatal to a baby or young child when ingested.  Avoid use  of all medicines, including herbs, over-the-counter medicines, not prescribed or suggested by your caregiver. Only take over-the-counter or prescription medicines for pain, discomfort, or fever as directed by your caregiver. Do not use aspirin, ibuprofen or naproxen unless approved by your caregiver.  Let your caregiver also know about herbs you may be using.  Alcohol is related to a number of birth defects. This includes fetal alcohol syndrome. All alcohol, in any form, should be avoided completely. Smoking will cause low birth rate and premature babies.  Illegal drugs are very harmful to the baby. They are absolutely forbidden. A baby born to an addicted mother will be addicted at birth. The baby will go through the same withdrawal an adult does. SEEK MEDICAL CARE IF: You have any concerns or worries during your pregnancy. It is better to call with your questions if you feel they cannot wait, rather than worry about them. SEEK IMMEDIATE MEDICAL CARE IF:   An unexplained oral temperature above 102 F (38.9 C) develops, or as your caregiver suggests.  You have leaking of fluid from the vagina. If leaking membranes are suspected, take your temperature and tell your caregiver of this when you call.  There is vaginal spotting, bleeding or passing clots. Tell your caregiver of the amount and how many pads are used.  You develop a bad smelling vaginal discharge with a change in the color from clear to white.  You develop vomiting that lasts more than 24 hours.  You develop chills or fever.  You develop shortness of breath.  You develop burning on urination.  You loose more than 2 pounds of weight or gain more than 2 pounds of weight or as suggested by your caregiver.  You notice sudden swelling of your face, hands, and feet or legs.  You develop belly (abdominal) pain. Round ligament discomfort is a common non-cancerous (benign) cause of abdominal pain in pregnancy. Your caregiver still  must evaluate you.  You develop a severe headache that does not go away.  You develop visual problems, blurred or double vision.  If you have not felt your baby move for more than 1 hour. If you think the baby is not moving as much as usual, eat something with sugar in it and lie down on your left side for an hour. The baby should move at least 4 to 5 times per hour. Call right away if your baby moves less than that.  You fall, are in a car accident, or any kind of trauma.  There is mental or physical violence at home. Document Released: 04/11/2001   Document Revised: 01/10/2012 Document Reviewed: 10/14/2008 [employer] Patient Information 2014 [employer].  Breastfeeding A change in hormones during your pregnancy causes growth of your breast tissue and an increase in number and size of milk ducts. The hormone prolactin allows proteins, sugars, and fats from your blood supply to make breast milk in your milk-producing glands. The hormone progesterone prevents breast milk from being released before the birth of your baby. After the birth of your baby, your progesterone level decreases allowing breast milk to be released. Thoughts of your baby, as well as his or her sucking or crying, can stimulate the release of milk from the milk-producing glands. Deciding to breastfeed (nurse) is one of the best choices you can make for you and your baby. The information that follows gives a brief review of the benefits, as well as other important skills to know about breastfeeding. BENEFITS OF BREASTFEEDING For your baby  The first milk (colostrum) helps your baby's digestive system function better.   There are antibodies in your milk that help your baby fight off infections.   Your baby has a lower incidence of asthma, allergies, and sudden infant death syndrome (SIDS).   The nutrients in breast milk are better for your baby than infant formulas.  Breast milk improves your baby's brain development.    Your baby will have less gas, colic, and constipation.  Your baby is less likely to develop other conditions, such as childhood obesity, asthma, or diabetes mellitus. For you  Breastfeeding helps develop a very special bond between you and your baby.   Breastfeeding is convenient, always available at the correct temperature, and costs nothing.   Breastfeeding helps to burn calories and helps you lose the weight gained during pregnancy.   Breastfeeding makes your uterus contract back down to normal size faster and slows bleeding following delivery.   Breastfeeding mothers have a lower risk of developing osteoporosis or breast or ovarian cancer later in life.  BREASTFEEDING FREQUENCY  A healthy, full-term baby may breastfeed as often as every hour or space his or her feedings to every 3 hours. Breastfeeding frequency will vary from baby to baby.   Newborns should be fed no less than every 2 3 hours during the day and every 4 5 hours during the night. You should breastfeed a minimum of 8 feedings in a 24 hour period.  Awaken your baby to breastfeed if it has been 3 4 hours since the last feeding.  Breastfeed when you feel the need to reduce the fullness of your breasts or when your newborn shows signs of hunger. Signs that your baby may be hungry include:  Increased alertness or activity.  Stretching.  Movement of the head from side to side.  Movement of the head and opening of the mouth when the corner of the mouth or cheek is stroked (rooting).  Increased sucking sounds, smacking lips, cooing, sighing, or squeaking.  Hand-to-mouth movements.  Increased sucking of fingers or hands.  Fussing.  Intermittent crying.  Signs of extreme hunger will require calming and consoling before you try to feed your baby. Signs of extreme hunger may include:  Restlessness.  A loud, strong cry.  Screaming.  Frequent feeding will help you make more milk and will help prevent  problems, such as sore nipples and engorgement of the breasts.  BREASTFEEDING   Whether lying down or sitting, be sure that the baby's abdomen is facing your abdomen.   Support your breast with 4 fingers under your breast   and your thumb above your nipple. Make sure your fingers are well away from your nipple and your baby's mouth.   Stroke your baby's lips gently with your finger or nipple.   When your baby's mouth is open wide enough, place all of your nipple and as much of the colored area around your nipple (areola) as possible into your baby's mouth.  More areola should be visible above his or her upper lip than below his or her lower lip.  Your baby's tongue should be between his or her lower gum and your breast.  Ensure that your baby's mouth is correctly positioned around the nipple (latched). Your baby's lips should create a seal on your breast.  Signs that your baby has effectively latched onto your nipple include:  Tugging or sucking without pain.  Swallowing heard between sucks.  Absent click or smacking sound.  Muscle movement above and in front of his or her ears with sucking.  Your baby must suck about 2 3 minutes in order to get your milk. Allow your baby to feed on each breast as long as he or she wants. Nurse your baby until he or she unlatches or falls asleep at the first breast, then offer the second breast.  Signs that your baby is full and satisfied include:  A gradual decrease in the number of sucks or complete cessation of sucking.  Falling asleep.  Extension or relaxation of his or her body.  Retention of a small amount of milk in his or her mouth.  Letting go of your breast by himself or herself.  Signs of effective breastfeeding in you include:  Breasts that have increased firmness, weight, and size prior to feeding.  Breasts that are softer after nursing.  Increased milk volume, as well as a change in milk consistency and color by the 5th  day of breastfeeding.  Breast fullness relieved by breastfeeding.  Nipples are not sore, cracked, or bleeding.  If needed, break the suction by putting your finger into the corner of your baby's mouth and sliding your finger between his or her gums. Then, remove your breast from his or her mouth.  It is common for babies to spit up a small amount after a feeding.  Babies often swallow air during feeding. This can make babies fussy. Burping your baby between breasts can help with this.  Vitamin D supplements are recommended for babies who get only breast milk.  Avoid using a pacifier during your baby's first 4 6 weeks.  Avoid supplemental feedings of water, formula, or juice in place of breastfeeding. Breast milk is all the food your baby needs. It is not necessary for your baby to have water or formula. Your breasts will make more milk if supplemental feedings are avoided during the early weeks. HOW TO TELL WHETHER YOUR BABY IS GETTING ENOUGH BREAST MILK Wondering whether or not your baby is getting enough milk is a common concern among mothers. You can be assured that your baby is getting enough milk if:   Your baby is actively sucking and you hear swallowing.   Your baby seems relaxed and satisfied after a feeding.   Your baby nurses at least 8 12 times in a 24 hour time period.  During the first 3 5 days of age:  Your baby is wetting at least 3 5 diapers in a 24 hour period. The urine should be clear and pale yellow.  Your baby is having at least 3 4 stools in   a 24 hour period. The stool should be soft and yellow.  At 5 7 days of age, your baby is having at least 3 6 stools in a 24 hour period. The stool should be seedy and yellow by 5 days of age.  Your baby has a weight loss less than 7 10% during the first 3 days of age.  Your baby does not lose weight after 3 7 days of age.  Your baby gains 4 7 ounces each week after he or she is 4 days of age.  Your baby gains weight  by 5 days of age and is back to birth weight within 2 weeks. ENGORGEMENT In the first week after your baby is born, you may experience extremely full breasts (engorgement). When engorged, your breasts may feel heavy, warm, or tender to the touch. Engorgement peaks within 24 48 hours after delivery of your baby.  Engorgement may be reduced by:  Continuing to breastfeed.  Increasing the frequency of breastfeeding.  Taking warm showers or applying warm, moist heat to your breasts just before each feeding. This increases circulation and helps the milk flow.   Gently massaging your breast before and during the feedings. With your fingertips, massage from your chest wall towards your nipple in a circular motion.   Ensuring that your baby empties at least one breast at every feeding. It also helps to start the next feeding on the opposite breast.   Expressing breast milk by hand or by using a breast pump to empty the breasts if your baby is sleepy, or not nursing well. You may also want to express milk if you are returning to work oryou feel you are getting engorged.  Ensuring your baby is latched on and positioned properly while breastfeeding. If you follow these suggestions, your engorgement should improve in 24 48 hours. If you are still experiencing difficulty, call your lactation consultant or caregiver.  CARING FOR YOURSELF Take care of your breasts.  Bathe or shower daily.   Avoid using soap on your nipples.   Wear a supportive bra. Avoid wearing underwire style bras.  Air dry your nipples for a 3 4minutes after each feeding.   Use only cotton bra pads to absorb breast milk leakage. Leaking of breast milk between feedings is normal.   Use only pure lanolin on your nipples after nursing. You do not need to wash it off before feeding your baby again. Another option is to express a few drops of breast milk and gently massage that milk into your nipples.  Continue breast  self-awareness checks. Take care of yourself.  Eat healthy foods. Alternate 3 meals with 3 snacks.  Avoid foods that you notice affect your baby in a bad way.  Drink milk, fruit juice, and water to satisfy your thirst (about 8 glasses a day).   Rest often, relax, and take your prenatal vitamins to prevent fatigue, stress, and anemia.  Avoid chewing and smoking tobacco.  Avoid alcohol and drug use.  Take over-the-counter and prescribed medicine only as directed by your caregiver or pharmacist. You should always check with your caregiver or pharmacist before taking any new medicine, vitamin, or herbal supplement.  Know that pregnancy is possible while breastfeeding. If desired, talk to your caregiver about family planning and safe birth control methods that may be used while breastfeeding. SEEK MEDICAL CARE IF:   You feel like you want to stop breastfeeding or have become frustrated with breastfeeding.  You have painful breasts or nipples.    Your nipples are cracked or bleeding.  Your breasts are red, tender, or warm.  You have a swollen area on either breast.  You have a fever or chills.  You have nausea or vomiting.  You have drainage from your nipples.  Your breasts do not become full before feedings by the 5th day after delivery.  You feel sad and depressed.  Your baby is too sleepy to eat well.  Your baby is having trouble sleeping.   Your baby is wetting less than 3 diapers in a 24 hour period.  Your baby has less than 3 stools in a 24 hour period.  Your baby's skin or the white part of his or her eyes becomes more yellow.   Your baby is not gaining weight by 5 days of age. MAKE SURE YOU:   Understand these instructions.  Will watch your condition.  Will get help right away if you are not doing well or get worse. Document Released: 04/17/2005 Document Revised: 01/10/2012 Document Reviewed: 11/22/2011 [employer] Patient Information 2014 [employer].  

## 2012-11-14 NOTE — Progress Notes (Signed)
Pulse- 72  Pain/pressure- lower abd Pt reports severe heartburn that could be causing her not to have appetite

## 2012-11-14 NOTE — Progress Notes (Signed)
Doing well--Heartburn--Prilosec. F/u fetal renal pyelectasis. TDaP today

## 2012-11-20 ENCOUNTER — Encounter (HOSPITAL_COMMUNITY): Payer: Self-pay

## 2012-11-20 ENCOUNTER — Ambulatory Visit (HOSPITAL_COMMUNITY)
Admission: RE | Admit: 2012-11-20 | Discharge: 2012-11-20 | Disposition: A | Discharge status: Home / Self Care | Payer: Medicaid Other | Source: Ambulatory Visit | Attending: Family Medicine | Admitting: Family Medicine

## 2012-11-20 VITALS — BP 128/75 | HR 73 | Wt 232.0 lb

## 2012-11-20 DIAGNOSIS — Z3689 Encounter for other specified antenatal screening: Secondary | ICD-10-CM | POA: Insufficient documentation

## 2012-11-20 DIAGNOSIS — O283 Abnormal ultrasonic finding on antenatal screening of mother: Secondary | ICD-10-CM

## 2012-11-20 DIAGNOSIS — O289 Unspecified abnormal findings on antenatal screening of mother: Secondary | ICD-10-CM | POA: Insufficient documentation

## 2012-11-20 DIAGNOSIS — O98211 Gonorrhea complicating pregnancy, first trimester: Secondary | ICD-10-CM

## 2012-11-20 NOTE — Progress Notes (Signed)
[facility] ultrasound  Indication: 27 yr old [id number] at [redacted]w[redacted]d for fetal ultrasound secondary to finding of pyelectasis on outside ultrasound.  Findings: 1. Single intrauterine pregnancy. 2. Estimated fetal weight is in the 24th%. The head, femur, and humerus measure <3rd%. 3. Posterior placenta without evidence of previa. 4. Normal amniotic fluid index. 5. The anatomy survey is limited by advanced gestational age. No abnormalities are seen. 6. The fetal kidneys are normal.  Recommendations: 1. Overall appropriate fetal growth: - given lagging head and long bone measurements consider follow up ultrasound for fetal growth in 3 weeks (not scheduled today) 2. Normal limited anatomy survey. 3. Previous finding of pyelectasis: - this has resolved - discussed when found in the second trimester it is considered a minor marker for trisomy 21 and slightly increases this risk; discussed in otherwise low risk women risk increase is minimal when found in isolation - patient did not have aneuploidy screening - recommend inform Pediatrics at delivery  Dorothy Foster, MD

## 2012-11-21 ENCOUNTER — Encounter: Payer: Self-pay | Admitting: Family Medicine

## 2012-11-21 ENCOUNTER — Ambulatory Visit (INDEPENDENT_AMBULATORY_CARE_PROVIDER_SITE_OTHER): Payer: Medicaid Other | Admitting: Obstetrics and Gynecology

## 2012-11-21 ENCOUNTER — Encounter: Payer: Self-pay | Admitting: Obstetrics and Gynecology

## 2012-11-21 VITALS — BP 116/72 | Temp 99.5°F | Wt 230.0 lb

## 2012-11-21 DIAGNOSIS — O365990 Maternal care for other known or suspected poor fetal growth, unspecified trimester, not applicable or unspecified: Secondary | ICD-10-CM

## 2012-11-21 DIAGNOSIS — O283 Abnormal ultrasonic finding on antenatal screening of mother: Secondary | ICD-10-CM

## 2012-11-21 DIAGNOSIS — O289 Unspecified abnormal findings on antenatal screening of mother: Secondary | ICD-10-CM

## 2012-11-21 LAB — POCT URINALYSIS DIP (DEVICE)
Glucose, UA: NEGATIVE mg/dL
Specific Gravity, Urine: 1.025 (ref 1.005–1.030)
Urobilinogen, UA: 1 mg/dL (ref 0.0–1.0)

## 2012-11-21 NOTE — Progress Notes (Signed)
Doing well. GFM. Diff FOB this preg and FOB short stature 5'0. Will schedule the F/U growth scan at next visit.

## 2012-11-21 NOTE — Progress Notes (Signed)
Pulse: 82

## 2012-11-21 NOTE — Patient Instructions (Signed)
Fetal Movement Counts Patient Name: __________________________________________________ Patient Due Date: ____________________ Performing a fetal movement count is highly recommended in high-risk pregnancies, but it is good for every pregnant woman to do. Your caregiver may ask you to start counting fetal movements at 28 weeks of the pregnancy. Fetal movements often increase:  After eating a full meal.  After physical activity.  After eating or drinking something sweet or cold.  At rest. Pay attention to when you feel the baby is most active. This will help you notice a pattern of your baby's sleep and wake cycles and what factors contribute to an increase in fetal movement. It is important to perform a fetal movement count at the same time each day when your baby is normally most active.  HOW TO COUNT FETAL MOVEMENTS 1. Find a quiet and comfortable area to sit or lie down on your left side. Lying on your left side provides the best blood and oxygen circulation to your baby. 2. Write down the day and time on a sheet of paper or in a journal. 3. Start counting kicks, flutters, swishes, rolls, or jabs in a 2 hour period. You should feel at least 10 movements within 2 hours. 4. If you do not feel 10 movements in 2 hours, wait 2 3 hours and count again. Look for a change in the pattern or not enough counts in 2 hours. SEEK MEDICAL CARE IF:  You feel less than 10 counts in 2 hours, tried twice.  There is no movement in over an hour.  The pattern is changing or taking longer each day to reach 10 counts in 2 hours.  You feel the baby is not moving as he or she usually does. Date: ____________ Movements: ____________ Start time: ____________ Finish time: ____________  Date: ____________ Movements: ____________ Start time: ____________ Finish time: ____________ Date: ____________ Movements: ____________ Start time: ____________ Finish time: ____________ Date: ____________ Movements: ____________  Start time: ____________ Finish time: ____________ Date: ____________ Movements: ____________ Start time: ____________ Finish time: ____________ Date: ____________ Movements: ____________ Start time: ____________ Finish time: ____________ Date: ____________ Movements: ____________ Start time: ____________ Finish time: ____________ Date: ____________ Movements: ____________ Start time: ____________ Finish time: ____________  Date: ____________ Movements: ____________ Start time: ____________ Finish time: ____________ Date: ____________ Movements: ____________ Start time: ____________ Finish time: ____________ Date: ____________ Movements: ____________ Start time: ____________ Finish time: ____________ Date: ____________ Movements: ____________ Start time: ____________ Finish time: ____________ Date: ____________ Movements: ____________ Start time: ____________ Finish time: ____________ Date: ____________ Movements: ____________ Start time: ____________ Finish time: ____________ Date: ____________ Movements: ____________ Start time: ____________ Finish time: ____________  Date: ____________ Movements: ____________ Start time: ____________ Finish time: ____________ Date: ____________ Movements: ____________ Start time: ____________ Finish time: ____________ Date: ____________ Movements: ____________ Start time: ____________ Finish time: ____________ Date: ____________ Movements: ____________ Start time: ____________ Finish time: ____________ Date: ____________ Movements: ____________ Start time: ____________ Finish time: ____________ Date: ____________ Movements: ____________ Start time: ____________ Finish time: ____________ Date: ____________ Movements: ____________ Start time: ____________ Finish time: ____________  Date: ____________ Movements: ____________ Start time: ____________ Finish time: ____________ Date: ____________ Movements: ____________ Start time: ____________ Finish time:  ____________ Date: ____________ Movements: ____________ Start time: ____________ Finish time: ____________ Date: ____________ Movements: ____________ Start time: ____________ Finish time: ____________ Date: ____________ Movements: ____________ Start time: ____________ Finish time: ____________ Date: ____________ Movements: ____________ Start time: ____________ Finish time: ____________ Date: ____________ Movements: ____________ Start time: ____________ Finish time: ____________  Date: ____________ Movements: ____________ Start time: ____________ Finish   time: ____________ Date: ____________ Movements: ____________ Start time: ____________ Finish time: ____________ Date: ____________ Movements: ____________ Start time: ____________ Finish time: ____________ Date: ____________ Movements: ____________ Start time: ____________ Finish time: ____________ Date: ____________ Movements: ____________ Start time: ____________ Finish time: ____________ Date: ____________ Movements: ____________ Start time: ____________ Finish time: ____________ Date: ____________ Movements: ____________ Start time: ____________ Finish time: ____________  Date: ____________ Movements: ____________ Start time: ____________ Finish time: ____________ Date: ____________ Movements: ____________ Start time: ____________ Finish time: ____________ Date: ____________ Movements: ____________ Start time: ____________ Finish time: ____________ Date: ____________ Movements: ____________ Start time: ____________ Finish time: ____________ Date: ____________ Movements: ____________ Start time: ____________ Finish time: ____________ Date: ____________ Movements: ____________ Start time: ____________ Finish time: ____________ Date: ____________ Movements: ____________ Start time: ____________ Finish time: ____________  Date: ____________ Movements: ____________ Start time: ____________ Finish time: ____________ Date: ____________ Movements:  ____________ Start time: ____________ Finish time: ____________ Date: ____________ Movements: ____________ Start time: ____________ Finish time: ____________ Date: ____________ Movements: ____________ Start time: ____________ Finish time: ____________ Date: ____________ Movements: ____________ Start time: ____________ Finish time: ____________ Date: ____________ Movements: ____________ Start time: ____________ Finish time: ____________ Date: ____________ Movements: ____________ Start time: ____________ Finish time: ____________  Date: ____________ Movements: ____________ Start time: ____________ Finish time: ____________ Date: ____________ Movements: ____________ Start time: ____________ Finish time: ____________ Date: ____________ Movements: ____________ Start time: ____________ Finish time: ____________ Date: ____________ Movements: ____________ Start time: ____________ Finish time: ____________ Date: ____________ Movements: ____________ Start time: ____________ Finish time: ____________ Date: ____________ Movements: ____________ Start time: ____________ Finish time: ____________ Document Released: 05/17/2006 Document Revised: 04/03/2012 Document Reviewed: 02/12/2012 [employer] Patient Information 2014 [employer].  

## 2012-12-02 ENCOUNTER — Inpatient Hospital Stay (HOSPITAL_COMMUNITY)
Admission: AD | Admit: 2012-12-02 | Discharge: 2012-12-02 | Disposition: A | Discharge status: Home / Self Care | Payer: Medicaid Other | Source: Ambulatory Visit | Attending: Obstetrics and Gynecology | Admitting: Obstetrics and Gynecology

## 2012-12-02 ENCOUNTER — Inpatient Hospital Stay (HOSPITAL_COMMUNITY): Payer: Medicaid Other

## 2012-12-02 ENCOUNTER — Encounter (HOSPITAL_COMMUNITY): Payer: Self-pay | Admitting: *Deleted

## 2012-12-02 DIAGNOSIS — O365990 Maternal care for other known or suspected poor fetal growth, unspecified trimester, not applicable or unspecified: Secondary | ICD-10-CM

## 2012-12-02 DIAGNOSIS — O368190 Decreased fetal movements, unspecified trimester, not applicable or unspecified: Secondary | ICD-10-CM

## 2012-12-02 DIAGNOSIS — O368390 Maternal care for abnormalities of the fetal heart rate or rhythm, unspecified trimester, not applicable or unspecified: Secondary | ICD-10-CM | POA: Insufficient documentation

## 2012-12-02 DIAGNOSIS — R42 Dizziness and giddiness: Secondary | ICD-10-CM

## 2012-12-02 DIAGNOSIS — IMO0002 Reserved for concepts with insufficient information to code with codable children: Secondary | ICD-10-CM

## 2012-12-02 NOTE — MAU Provider Note (Cosign Needed)
History     CSN: [id number]  Arrival date and time: 12/02/12 1918   First Provider Initiated Contact with Patient 12/02/12 2029      No chief complaint on file.  HPI Lianah BREEANNA GALGANO is a 27 y.o. [phone] at [redacted]w[redacted]d presents for evaluation of feeling under the weather starting today. Pt states that she woke with complaints of lower extremity swelling, light headedness when standing for prolonged periods and dizziness when rising quickly. Pt also endorses she feels SOB when exerting herself or in certain positions. Pt states that she has also not been sleeping well for last several days. Otherwise pt in usual state of health.  OB History   Grav Para Term Preterm Abortions TAB SAB Ect Mult Living   4 2 2  0 1 0 1 0 0 2      Past Medical History  Diagnosis Date  . Tachycardia   . Genital warts   . Gestational diabetes   . No pertinent past medical history     Past Surgical History  Procedure Laterality Date  . No past surgeries    . Removal of warts      Family History  Problem Relation Age of Onset  . Diabetes Mother   . Hypertension Mother   . Depression Mother   . Anxiety disorder Mother   . Diabetes Maternal Grandmother   . Hypertension Maternal Grandmother     History  Substance Use Topics  . Smoking status: Current Every Day Smoker -- 0.50 packs/day for 15 years    Types: Cigarettes  . Smokeless tobacco: Never Used  . Alcohol Use: No    Allergies: No Known Allergies  Prescriptions prior to admission  Medication Sig Dispense Refill  . omeprazole (PRILOSEC OTC) 20 MG tablet Take 1 tablet (20 mg total) by mouth daily.  30 tablet  3  . Prenatal Vit-Fe Fumarate-FA (PRENATAL MULTIVITAMIN) TABS Take 2 tablets by mouth daily.        Review of Systems  Constitutional: Positive for malaise/fatigue. Negative for fever and chills.  Eyes: Negative for blurred vision.  Respiratory: Positive for shortness of breath (with exertion). Negative for sputum production and  wheezing.   Cardiovascular: Positive for leg swelling. Negative for chest pain and palpitations.  Gastrointestinal: Positive for heartburn (at baseline). Negative for nausea, vomiting, abdominal pain, diarrhea and constipation.  Genitourinary: Negative for dysuria, urgency and frequency.  Musculoskeletal: Negative for myalgias.  Neurological: Positive for dizziness. Negative for tingling, tremors, sensory change, speech change, weakness and headaches.  Psychiatric/Behavioral: Negative for depression (neg SIGECAPS). The patient has insomnia.    Physical Exam   Blood pressure 123/68, pulse 92, temperature 98.2 F (36.8 C), temperature source Oral, resp. rate 16, height 5\' 9"  (1.753 m), weight 106.595 kg (235 lb), last menstrual period 02/19/2012, unknown if currently breastfeeding.  Physical Exam  Nursing note and vitals reviewed. Constitutional: She is oriented to person, place, and time. She appears well-developed and well-nourished. No distress.  HENT:  Head: Normocephalic and atraumatic.  Eyes: Conjunctivae and EOM are normal.  Neck: Normal range of motion. Neck supple.  Cardiovascular: Normal rate, regular rhythm, normal heart sounds and intact distal pulses.  Exam reveals no gallop and no friction rub.   No murmur heard. Neg orthostatic hypotension   Respiratory: Effort normal and breath sounds normal. No respiratory distress. She has no wheezes. She has no rales. She exhibits no tenderness.  GI: Soft. Bowel sounds are normal. She exhibits no distension and no mass. There  is no tenderness. There is no rebound and no guarding.  Musculoskeletal: Normal range of motion. She exhibits edema (trace bilateral equal edeme, no cords, net hammans sign). She exhibits no tenderness.  Neurological: She is alert and oriented to person, place, and time. No cranial nerve deficit.  Skin: She is not diaphoretic.  Psychiatric: Her behavior is normal. Judgment and thought content normal. Her mood  appears not anxious. Her affect is not angry, not blunt, not labile and not inappropriate. Her speech is not rapid and/or pressured, not delayed, not tangential and not slurred. She is not agitated and not aggressive. Cognition and memory are normal. She does not exhibit a depressed mood. She is communicative.  Difficulty sleeping Decreased interest No guilt Dec energy No issue with concentration Appetite normal Psychomotor normal No SI/HI    FHT: 120s mod variability multiple accelss >15x15, 1 decel following accel to 115 for 20sec. Reported associated with position change Toco: uterine irritability/no ctx  BPP 8/8  MAU Course  Procedures  MDM Overall reassuring hx and exam. However with decel on FHT, pt sent for BPP that was normal 8/8  Assessment and Plan  Zyonna RYLAND SMOOTS is a 27 y.o. [id number] at [redacted]w[redacted]d with non specific findings will discharge home and follow up in clinic as scheduled on Thursday. Labor precautions given to patient and hypotension precautions as well.  Tawana Scale 12/02/2012, 8:52 PM

## 2012-12-02 NOTE — Progress Notes (Signed)
MFM ultrasound  Indication: 27 yr old [id number] at [redacted]w[redacted]d with previous finding of lagging head, femur, and humerus measurements for biophysical profile secondary to decreased fetal movement.  Findings: 1. Single intrauterine pregnancy. 2. Posterior placenta without evidence of previa. 3. Normal amniotic fluid index. 4. Normal biophysical profile of 8/8.  Recommendations: 1. Given previous finding of lagging head and long bone measurements recommend follow up ultrasound for fetal growth 3 weeks after prior (due in 1 week) 2. Normal biophysical profile 3. Recommend evaluate fetal tracing for disposition 4. Recommend fetal kick counts  Eulis Foster, MD

## 2012-12-05 ENCOUNTER — Ambulatory Visit (INDEPENDENT_AMBULATORY_CARE_PROVIDER_SITE_OTHER): Payer: Medicaid Other | Admitting: Obstetrics and Gynecology

## 2012-12-05 VITALS — Wt 236.7 lb

## 2012-12-05 DIAGNOSIS — Z3481 Encounter for supervision of other normal pregnancy, first trimester: Secondary | ICD-10-CM

## 2012-12-05 DIAGNOSIS — O26849 Uterine size-date discrepancy, unspecified trimester: Secondary | ICD-10-CM

## 2012-12-05 DIAGNOSIS — O26843 Uterine size-date discrepancy, third trimester: Secondary | ICD-10-CM

## 2012-12-05 LAB — POCT URINALYSIS DIP (DEVICE)
Glucose, UA: NEGATIVE mg/dL
Hgb urine dipstick: NEGATIVE
Ketones, ur: NEGATIVE mg/dL
Nitrite: NEGATIVE
Protein, ur: 30 mg/dL — AB
Specific Gravity, Urine: 1.025 (ref 1.005–1.030)
Urobilinogen, UA: 2 mg/dL — ABNORMAL HIGH (ref 0.0–1.0)
pH: 6.5 (ref 5.0–8.0)

## 2012-12-05 LAB — OB RESULTS CONSOLE GBS: GBS: POSITIVE

## 2012-12-05 LAB — OB RESULTS CONSOLE GC/CHLAMYDIA: Chlamydia: NEGATIVE

## 2012-12-05 NOTE — Progress Notes (Signed)
F/U growth scan next wk. Had BPP 8/8, nl AFI on 12/03/12.

## 2012-12-05 NOTE — Progress Notes (Signed)
Ultrasound scheduled for 12/23/12 at 9:15 am.

## 2012-12-05 NOTE — Progress Notes (Signed)
Pulse-   Edema-feet  Pain/pressure-lower abd

## 2012-12-06 LAB — GC/CHLAMYDIA PROBE AMP: CT Probe RNA: NEGATIVE

## 2012-12-06 NOTE — Patient Instructions (Signed)

## 2012-12-09 ENCOUNTER — Encounter: Payer: Self-pay | Admitting: *Deleted

## 2012-12-09 DIAGNOSIS — Z3480 Encounter for supervision of other normal pregnancy, unspecified trimester: Secondary | ICD-10-CM | POA: Insufficient documentation

## 2012-12-12 ENCOUNTER — Inpatient Hospital Stay (HOSPITAL_COMMUNITY)
Admission: AD | Admit: 2012-12-12 | Discharge: 2012-12-12 | Disposition: A | Discharge status: Home / Self Care | Payer: Medicaid Other | Source: Ambulatory Visit | Attending: Family Medicine | Admitting: Family Medicine

## 2012-12-12 ENCOUNTER — Encounter (HOSPITAL_COMMUNITY): Payer: Self-pay | Admitting: *Deleted

## 2012-12-12 ENCOUNTER — Ambulatory Visit (INDEPENDENT_AMBULATORY_CARE_PROVIDER_SITE_OTHER): Payer: Medicaid Other | Admitting: Obstetrics & Gynecology

## 2012-12-12 VITALS — BP 129/88 | Wt 247.8 lb

## 2012-12-12 DIAGNOSIS — Z0000 Encounter for general adult medical examination without abnormal findings: Secondary | ICD-10-CM

## 2012-12-12 DIAGNOSIS — Z3483 Encounter for supervision of other normal pregnancy, third trimester: Secondary | ICD-10-CM

## 2012-12-12 DIAGNOSIS — O365990 Maternal care for other known or suspected poor fetal growth, unspecified trimester, not applicable or unspecified: Secondary | ICD-10-CM

## 2012-12-12 DIAGNOSIS — O99891 Other specified diseases and conditions complicating pregnancy: Secondary | ICD-10-CM | POA: Insufficient documentation

## 2012-12-12 LAB — POCT URINALYSIS DIP (DEVICE)
Bilirubin Urine: NEGATIVE
Ketones, ur: NEGATIVE mg/dL
pH: 6 (ref 5.0–8.0)

## 2012-12-12 NOTE — Progress Notes (Signed)
F/U US 8/25. Few BHC, mild

## 2012-12-12 NOTE — MAU Provider Note (Signed)
First Provider Initiated Contact with Patient 12/12/12 2239      Chief Complaint:  Rupture of Membranes   Dorothy Meadows is  27 y.o. [phone] at [redacted]w[redacted]d presents complaining of Rupture of Membranes She states that her panties got wet around 1500.  She states that they have been damp since then Obstetrical/Gynecological History: OB History   Grav Para Term Preterm Abortions TAB SAB Ect Mult Living   4 2 2  0 1 0 1 0 0 2     Past Medical History: Past Medical History  Diagnosis Date  . Tachycardia   . Genital warts   . Gestational diabetes   . No pertinent past medical history     Past Surgical History: Past Surgical History  Procedure Laterality Date  . No past surgeries    . Removal of warts      Family History: Family History  Problem Relation Age of Onset  . Diabetes Mother   . Hypertension Mother   . Depression Mother   . Anxiety disorder Mother   . Diabetes Maternal Grandmother   . Hypertension Maternal Grandmother     Social History: History  Substance Use Topics  . Smoking status: Current Every Day Smoker -- 0.50 packs/day for 15 years    Types: Cigarettes  . Smokeless tobacco: Never Used  . Alcohol Use: No    Allergies: No Known Allergies  Meds:  Prescriptions prior to admission  Medication Sig Dispense Refill  . omeprazole (PRILOSEC OTC) 20 MG tablet Take 1 tablet (20 mg total) by mouth daily.  30 tablet  3  . Prenatal Vit-Fe Fumarate-FA (PRENATAL MULTIVITAMIN) TABS Take 2 tablets by mouth daily.        Review of Systems   Constitutional: Negative for fever and chills Eyes: Negative for visual disturbances Respiratory: Negative for shortness of breath, dyspnea Cardiovascular: Negative for chest pain or palpitations  Gastrointestinal: Negative for vomiting, diarrhea and constipation Genitourinary: Negative for dysuria and urgency Musculoskeletal: Negative for back pain, joint pain, myalgias  Neurological: Negative for dizziness and  headaches     Physical Exam  Blood pressure 130/76, pulse 76, temperature 98.1 F (36.7 C), temperature source Oral, resp. rate 20, height 5\' 5"  (1.651 m), weight 112.946 kg (249 lb), last menstrual period 02/19/2012. GENERAL: Well-developed, well-nourished female in no acute distress.  LUNGS: Clear to auscultation bilaterally.  HEART: Regular rate and rhythm. ABDOMEN: Soft, nontender, nondistended, gravid.  EXTREMITIES: Nontender, no edema, 2+ distal pulses. DTR's 2+ CERVICAL EXAM: Dilatation FT cm   Effacement 0%   Station -3   SSE:  No pooling, negative valsalva and fern.  Normal appearing discharge.  Wet prep negative Presentation: cephalic FHT:  Baseline rate 150 bpm   Variability moderate  Accelerations present   Decelerations none Contractions: Every 0 mins   Labs: Results for orders placed in visit on 12/12/12 (from the past 24 hour(s))  POCT URINALYSIS DIP (DEVICE)   Collection Time    12/12/12  8:36 AM      Result Value Range   Glucose, UA NEGATIVE  NEGATIVE mg/dL   Bilirubin Urine NEGATIVE  NEGATIVE   Ketones, ur NEGATIVE  NEGATIVE mg/dL   Specific Gravity, Urine 1.010  1.005 - 1.030   Hgb urine dipstick NEGATIVE  NEGATIVE   pH 6.0  5.0 - 8.0   Protein, ur NEGATIVE  NEGATIVE mg/dL   Urobilinogen, UA 0.2  0.0 - 1.0 mg/dL   Nitrite NEGATIVE  NEGATIVE   Leukocytes, UA SMALL (*) NEGATIVE  Assessment: Dorothy Meadows is  27 y.o. [phone] at [redacted]w[redacted]d presents with no evidence of ROM; normal d/c.  Plan: D/c home  CRESENZO-DISHMAN,Lemmie Steinhaus 8/14/201411:13 PM

## 2012-12-12 NOTE — MAU Note (Signed)
PT SAYS AT 3PM- SHE WENT TO B-ROOM AND  UNDERWEAR WAS =WET.    PAD ON - AFTERWARD CONTINUED TO GET WET.   HAVING UC- BUT NOT STRONG.   NO VE IN CLINIC.   DENIES HSV AND SAYS HAS BOILS   ON ABD AND THIGHS-  NOT MRSA-  DX WITH BACTERIA ON SKIN.

## 2012-12-12 NOTE — Patient Instructions (Addendum)
Vaginal Delivery  Your caregiver must first be sure you are in labor. Signs of labor include:   You may pass what is called "the mucus plug" before labor begins. This is a small amount of blood stained mucus.   Regular uterine contractions.   The time between contractions get closer together.   The discomfort and pain gradually gets more intense.   Pains are mostly located in the back.   Pains get worse when walking.   The cervix (the opening of the uterus) becomes thinner (begins to efface) and opens up (dilates).  Once you are in labor and admitted into the hospital or care center, your caregiver will do the following:   A complete physical examination.   Check your vital signs (blood pressure, pulse, temperature and the fetal heart rate).   Do a vaginal examination (using a sterile glove and lubricant) to determine:   The position (presentation) of the baby (head [vertex] or buttock first).   The level (station) of the baby's head in the birth canal.   The effacement and dilatation of the cervix.   You may have your pubic hair shaved and be given an enema depending on your caregiver and the circumstance.   An electronic monitor is usually placed on your abdomen. The monitor follows the length and intensity of the contractions, as well as the baby's heart rate.   Usually, your caregiver will insert an IV in your arm with a bottle of sugar water. This is done as a precaution so that medications can be given to you quickly during labor or delivery.  NORMAL LABOR AND DELIVERY IS DIVIDED UP INTO 3 STAGES:  First Stage  This is when regular contractions begin and the cervix begins to efface and dilate. This stage can last from 3 to 15 hours. The end of the first stage is when the cervix is 100% effaced and 10 centimeters dilated. Pain medications may be given by    Injection (morphine, demerol, etc.)    Regional anesthesia (spinal, caudal or epidural, anesthetics given in different locations of the spine). Paracervical pain medication may be given, which is an injection of and anesthetic on each side of the cervix.  A pregnant woman may request to have "Natural Childbirth" which is not to have any medications or anesthesia during her labor and delivery.  Second Stage  This is when the baby comes down through the birth canal (vagina) and is born. This can take 1 to 4 hours. As the baby's head comes down through the birth canal, you may feel like you are going to have a bowel movement. You will get the urge to bear down and push until the baby is delivered. As the baby's head is being delivered, the caregiver will decide if an episiotomy (a cut in the perineum and vagina area) is needed to prevent tearing of the tissue in this area. The episiotomy is sewn up after the delivery of the baby and placenta. Sometimes a mask with nitrous oxide is given for the mother to breath during the delivery of the baby to help if there is too much pain. The end of Stage 2 is when the baby is fully delivered. Then when the umbilical cord stops pulsating it is clamped and cut.  Third Stage  The third stage begins after the baby is completely delivered and ends after the placenta (afterbirth) is delivered. This usually takes 5 to 30 minutes. After the placenta is delivered, a medication is given   either by intravenous or injection to help contract the uterus and prevent bleeding. The third stage is not painful and pain medication is usually not necessary. If an episiotomy was done, it is repaired at this time.  After the delivery, the mother is watched and monitored closely for 1 to 2 hours to make sure there is no postpartum bleeding (hemorrhage). If there is a lot of bleeding, medication is given to contract the uterus and stop the bleeding.  Document Released: 01/25/2008 Document Revised: 01/10/2012 Document Reviewed: 01/25/2008   [employer] Patient Information 2014 [employer].

## 2012-12-12 NOTE — MAU Note (Signed)
Pt states she went to the bathroom about 1500 and the underwear she was wearing was wet. Pt states she has not has a gush.

## 2012-12-12 NOTE — Progress Notes (Signed)
Pulse: 78

## 2012-12-13 NOTE — MAU Provider Note (Signed)
Chart reviewed and agree with management and plan.  

## 2012-12-19 ENCOUNTER — Encounter: Payer: Self-pay | Admitting: Obstetrics and Gynecology

## 2012-12-19 ENCOUNTER — Ambulatory Visit (INDEPENDENT_AMBULATORY_CARE_PROVIDER_SITE_OTHER): Payer: Medicaid Other | Admitting: Obstetrics and Gynecology

## 2012-12-19 ENCOUNTER — Encounter: Payer: Self-pay | Admitting: *Deleted

## 2012-12-19 VITALS — BP 125/85 | Temp 98.2°F | Wt 249.0 lb

## 2012-12-19 DIAGNOSIS — O365990 Maternal care for other known or suspected poor fetal growth, unspecified trimester, not applicable or unspecified: Secondary | ICD-10-CM

## 2012-12-19 DIAGNOSIS — F17200 Nicotine dependence, unspecified, uncomplicated: Secondary | ICD-10-CM

## 2012-12-19 DIAGNOSIS — F17210 Nicotine dependence, cigarettes, uncomplicated: Secondary | ICD-10-CM | POA: Insufficient documentation

## 2012-12-19 DIAGNOSIS — Z0374 Encounter for suspected problem with fetal growth ruled out: Secondary | ICD-10-CM

## 2012-12-19 LAB — POCT URINALYSIS DIP (DEVICE)
Glucose, UA: NEGATIVE mg/dL
Leukocytes, UA: NEGATIVE
Nitrite: NEGATIVE
Specific Gravity, Urine: 1.02 (ref 1.005–1.030)
Urobilinogen, UA: 0.2 mg/dL (ref 0.0–1.0)

## 2012-12-19 NOTE — Progress Notes (Signed)
Pulse 70 Edema trace in hands and feet.

## 2012-12-19 NOTE — Progress Notes (Signed)
Doing well. Good FM. Smokes 1/2 PPD and following fetal growth. MFM scan 12/23/12.

## 2012-12-19 NOTE — Patient Instructions (Addendum)
Pregnancy - Third Trimester The third trimester of pregnancy (the last 3 months) is a period of the most rapid growth for you and your baby. The baby approaches a length of 20 inches and a weight of 6 to 10 pounds. The baby is adding on fat and getting ready for life outside your body. While inside, babies have periods of sleeping and waking, sucking thumbs, and hiccuping. You can often feel small contractions of the uterus. This is false labor. It is also called Braxton-Hicks contractions. This is like a practice for labor. The usual problems in this stage of pregnancy include more difficulty breathing, swelling of the hands and feet from water retention, and having to urinate more often because of the uterus and baby pressing on your bladder.  PRENATAL EXAMS  Blood work may continue to be done during prenatal exams. These tests are done to check on your health and the probable health of your baby. Blood work is used to follow your blood levels (hemoglobin). Anemia (low hemoglobin) is common during pregnancy. Iron and vitamins are given to help prevent this. You may also continue to be checked for diabetes. Some of the past blood tests may be done again.  The size of the uterus is measured during each visit. This makes sure your baby is growing properly according to your pregnancy dates.  Your blood pressure is checked every prenatal visit. This is to make sure you are not getting toxemia.  Your urine is checked every prenatal visit for infection, diabetes, and protein.  Your weight is checked at each visit. This is done to make sure gains are happening at the suggested rate and that you and your baby are growing normally.  Sometimes, an ultrasound is performed to confirm the position and the proper growth and development of the baby. This is a test done that bounces harmless sound waves off the baby so your caregiver can more accurately determine a due date.  Discuss the type of pain medicine and  anesthesia you will have during your labor and delivery.  Discuss the possibility and anesthesia if a cesarean section might be necessary.  Inform your caregiver if there is any mental or physical violence at home. Sometimes, a specialized non-stress test, contraction stress test, and biophysical profile are done to make sure the baby is not having a problem. Checking the amniotic fluid surrounding the baby is called an amniocentesis. The amniotic fluid is removed by sticking a needle into the belly (abdomen). This is sometimes done near the end of pregnancy if an early delivery is required. In this case, it is done to help make sure the baby's lungs are mature enough for the baby to live outside of the womb. If the lungs are not mature and it is unsafe to deliver the baby, an injection of cortisone medicine is given to the mother 1 to 2 days before the delivery. This helps the baby's lungs mature and makes it safer to deliver the baby. CHANGES OCCURING IN THE THIRD TRIMESTER OF PREGNANCY Your body goes through many changes during pregnancy. They vary from person to person. Talk to your caregiver about changes you notice and are concerned about.  During the last trimester, you have probably had an increase in your appetite. It is normal to have cravings for certain foods. This varies from person to person and pregnancy to pregnancy.  You may begin to get stretch marks on your hips, abdomen, and breasts. These are normal changes in the body   during pregnancy. There are no exercises or medicines to take which prevent this change.  Constipation may be treated with a stool softener or adding bulk to your diet. Drinking lots of fluids, fiber in vegetables, fruits, and whole grains are helpful.  Exercising is also helpful. If you have been very active up until your pregnancy, most of these activities can be continued during your pregnancy. If you have been less active, it is helpful to start an exercise  program such as walking. Consult your caregiver before starting exercise programs.  Avoid all smoking, alcohol, non-prescribed drugs, herbs and "street drugs" during your pregnancy. These chemicals affect the formation and growth of the baby. Avoid chemicals throughout the pregnancy to ensure the delivery of a healthy infant.  Backache, varicose veins, and hemorrhoids may develop or get worse.  You will tire more easily in the third trimester, which is normal.  The baby's movements may be stronger and more often.  You may become short of breath easily.  Your belly button may stick out.  A yellow discharge may leak from your breasts called colostrum.  You may have a bloody mucus discharge. This usually occurs a few days to a week before labor begins. HOME CARE INSTRUCTIONS   Keep your caregiver's appointments. Follow your caregiver's instructions regarding medicine use, exercise, and diet.  During pregnancy, you are providing food for you and your baby. Continue to eat regular, well-balanced meals. Choose foods such as meat, fish, milk and other low fat dairy products, vegetables, fruits, and whole-grain breads and cereals. Your caregiver will tell you of the ideal weight gain.  A physical sexual relationship may be continued throughout pregnancy if there are no other problems such as early (premature) leaking of amniotic fluid from the membranes, vaginal bleeding, or belly (abdominal) pain.  Exercise regularly if there are no restrictions. Check with your caregiver if you are unsure of the safety of your exercises. Greater weight gain will occur in the last 2 trimesters of pregnancy. Exercising helps:  Control your weight.  Get you in shape for labor and delivery.  You lose weight after you deliver.  Rest a lot with legs elevated, or as needed for leg cramps or low back pain.  Wear a good support or jogging bra for breast tenderness during pregnancy. This may help if worn during  sleep. Pads or tissues may be used in the bra if you are leaking colostrum.  Do not use hot tubs, steam rooms, or saunas.  Wear your seat belt when driving. This protects you and your baby if you are in an accident.  Avoid raw meat, cat litter boxes and soil used by cats. These carry germs that can cause birth defects in the baby.  It is easier to leak urine during pregnancy. Tightening up and strengthening the pelvic muscles will help with this problem. You can practice stopping your urination while you are going to the bathroom. These are the same muscles you need to strengthen. It is also the muscles you would use if you were trying to stop from passing gas. You can practice tightening these muscles up 10 times a set and repeating this about 3 times per day. Once you know what muscles to tighten up, do not perform these exercises during urination. It is more likely to cause an infection by backing up the urine.  Ask for help if you have financial, counseling, or nutritional needs during pregnancy. Your caregiver will be able to offer counseling for these   needs as well as refer you for other special needs.  Make a list of emergency phone numbers and have them available.  Plan on getting help from family or friends when you go home from the hospital.  Make a trial run to the hospital.  Take prenatal classes with the father to understand, practice, and ask questions about the labor and delivery.  Prepare the baby's room or nursery.  Do not travel out of the city unless it is absolutely necessary and with the advice of your caregiver.  Wear only low or no heal shoes to have better balance and prevent falling. MEDICINES AND DRUG USE IN PREGNANCY  Take prenatal vitamins as directed. The vitamin should contain 1 milligram of folic acid. Keep all vitamins out of reach of children. Only a couple vitamins or tablets containing iron may be fatal to a baby or young child when ingested.  Avoid use  of all medicines, including herbs, over-the-counter medicines, not prescribed or suggested by your caregiver. Only take over-the-counter or prescription medicines for pain, discomfort, or fever as directed by your caregiver. Do not use aspirin, ibuprofen or naproxen unless approved by your caregiver.  Let your caregiver also know about herbs you may be using.  Alcohol is related to a number of birth defects. This includes fetal alcohol syndrome. All alcohol, in any form, should be avoided completely. Smoking will cause low birth rate and premature babies.  Illegal drugs are very harmful to the baby. They are absolutely forbidden. A baby born to an addicted mother will be addicted at birth. The baby will go through the same withdrawal an adult does. SEEK MEDICAL CARE IF: You have any concerns or worries during your pregnancy. It is better to call with your questions if you feel they cannot wait, rather than worry about them. SEEK IMMEDIATE MEDICAL CARE IF:   An unexplained oral temperature above 102 F (38.9 C) develops, or as your caregiver suggests.  You have leaking of fluid from the vagina. If leaking membranes are suspected, take your temperature and tell your caregiver of this when you call.  There is vaginal spotting, bleeding or passing clots. Tell your caregiver of the amount and how many pads are used.  You develop a bad smelling vaginal discharge with a change in the color from clear to white.  You develop vomiting that lasts more than 24 hours.  You develop chills or fever.  You develop shortness of breath.  You develop burning on urination.  You loose more than 2 pounds of weight or gain more than 2 pounds of weight or as suggested by your caregiver.  You notice sudden swelling of your face, hands, and feet or legs.  You develop belly (abdominal) pain. Round ligament discomfort is a common non-cancerous (benign) cause of abdominal pain in pregnancy. Your caregiver still  must evaluate you.  You develop a severe headache that does not go away.  You develop visual problems, blurred or double vision.  If you have not felt your baby move for more than 1 hour. If you think the baby is not moving as much as usual, eat something with sugar in it and lie down on your left side for an hour. The baby should move at least 4 to 5 times per hour. Call right away if your baby moves less than that.  You fall, are in a car accident, or any kind of trauma.  There is mental or physical violence at home. Document Released: 04/11/2001   Document Revised: 01/10/2012 Document Reviewed: 10/14/2008 [employer] Patient Information 2014 [place], [place]. Smoking Cessation Quitting smoking is important to your health and has many advantages. However, it is not always easy to quit since nicotine is a very addictive drug. Often times, people try 3 times or more before being able to quit. This document explains the best ways for you to prepare to quit smoking. Quitting takes hard work and a lot of effort, but you can do it. ADVANTAGES OF QUITTING SMOKING  You will live longer, feel better, and live better.  Your body will feel the impact of quitting smoking almost immediately.  Within 20 minutes, blood pressure decreases. Your pulse returns to its normal level.  After 8 hours, carbon monoxide levels in the blood return to normal. Your oxygen level increases.  After 24 hours, the chance of having a heart attack starts to decrease. Your breath, hair, and body stop smelling like smoke.  After 48 hours, damaged nerve endings begin to recover. Your sense of taste and smell improve.  After 72 hours, the body is virtually free of nicotine. Your bronchial tubes relax and breathing becomes easier.  After 2 to 12 weeks, lungs can hold more air. Exercise becomes easier and circulation improves.  The risk of having a heart attack, stroke, cancer, or lung disease is greatly reduced.  After 1  year, the risk of coronary heart disease is cut in half.  After 5 years, the risk of stroke falls to the same as a nonsmoker.  After 10 years, the risk of lung cancer is cut in half and the risk of other cancers decreases significantly.  After 15 years, the risk of coronary heart disease drops, usually to the level of a nonsmoker.  If you are pregnant, quitting smoking will improve your chances of having a healthy baby.  The people you live with, especially any children, will be healthier.  You will have extra money to spend on things other than cigarettes. QUESTIONS TO THINK ABOUT BEFORE ATTEMPTING TO QUIT You may want to talk about your answers with your caregiver.  Why do you want to quit?  If you tried to quit in the past, what helped and what did not?  What will be the most difficult situations for you after you quit? How will you plan to handle them?  Who can help you through the tough times? Your family? Friends? A caregiver?  What pleasures do you get from smoking? What ways can you still get pleasure if you quit? Here are some questions to ask your caregiver:  How can you help me to be successful at quitting?  What medicine do you think would be best for me and how should I take it?  What should I do if I need more help?  What is smoking withdrawal like? How can I get information on withdrawal? GET READY  Set a quit date.  Change your environment by getting rid of all cigarettes, ashtrays, matches, and lighters in your home, car, or work. Do not let people smoke in your home.  Review your past attempts to quit. Think about what worked and what did not. GET SUPPORT AND ENCOURAGEMENT You have a better chance of being successful if you have help. You can get support in many ways.  Tell your family, friends, and co-workers that you are going to quit and need their support. Ask them not to smoke around you.  Get individual, group, or telephone counseling and support.  Programs are  available at local hospitals and health centers. Call your local health department for information about programs in your area.  Spiritual beliefs and practices may help some smokers quit.  Download a "quit meter" on your computer to keep track of quit statistics, such as how long you have gone without smoking, cigarettes not smoked, and money saved.  Get a self-help book about quitting smoking and staying off of tobacco. LEARN NEW SKILLS AND BEHAVIORS  Distract yourself from urges to smoke. Talk to someone, go for a walk, or occupy your time with a task.  Change your normal routine. Take a different route to work. Drink tea instead of coffee. Eat breakfast in a different place.  Reduce your stress. Take a hot bath, exercise, or read a book.  Plan something enjoyable to do every day. Reward yourself for not smoking.  Explore interactive web-based programs that specialize in helping you quit. GET MEDICINE AND USE IT CORRECTLY Medicines can help you stop smoking and decrease the urge to smoke. Combining medicine with the above behavioral methods and support can greatly increase your chances of successfully quitting smoking.  Nicotine replacement therapy helps deliver nicotine to your body without the negative effects and risks of smoking. Nicotine replacement therapy includes nicotine gum, lozenges, inhalers, nasal sprays, and skin patches. Some may be available over-the-counter and others require a prescription.  Antidepressant medicine helps people abstain from smoking, but how this works is unknown. This medicine is available by prescription.  Nicotinic receptor partial agonist medicine simulates the effect of nicotine in your brain. This medicine is available by prescription. Ask your caregiver for advice about which medicines to use and how to use them based on your health history. Your caregiver will tell you what side effects to look out for if you choose to be on a  medicine or therapy. Carefully read the information on the package. Do not use any other product containing nicotine while using a nicotine replacement product.  RELAPSE OR DIFFICULT SITUATIONS Most relapses occur within the first 3 months after quitting. Do not be discouraged if you start smoking again. Remember, most people try several times before finally quitting. You may have symptoms of withdrawal because your body is used to nicotine. You may crave cigarettes, be irritable, feel very hungry, cough often, get headaches, or have difficulty concentrating. The withdrawal symptoms are only temporary. They are strongest when you first quit, but they will go away within 10 14 days. To reduce the chances of relapse, try to:  Avoid drinking alcohol. Drinking lowers your chances of successfully quitting.  Reduce the amount of caffeine you consume. Once you quit smoking, the amount of caffeine in your body increases and can give you symptoms, such as a rapid heartbeat, sweating, and anxiety.  Avoid smokers because they can make you want to smoke.  Do not let weight gain distract you. Many smokers will gain weight when they quit, usually less than 10 pounds. Eat a healthy diet and stay active. You can always lose the weight gained after you quit.  Find ways to improve your mood other than smoking. FOR MORE INFORMATION  www.smokefree.gov  Document Released: 04/11/2001 Document Revised: 10/17/2011 Document Reviewed: 07/27/2011 [facility] Patient Information 2014 [place], [place].

## 2012-12-23 ENCOUNTER — Ambulatory Visit (HOSPITAL_COMMUNITY)
Admission: RE | Admit: 2012-12-23 | Discharge: 2012-12-23 | Disposition: A | Discharge status: Home / Self Care | Payer: Medicaid Other | Source: Ambulatory Visit | Attending: Obstetrics and Gynecology | Admitting: Obstetrics and Gynecology

## 2012-12-23 DIAGNOSIS — O99330 Smoking (tobacco) complicating pregnancy, unspecified trimester: Secondary | ICD-10-CM | POA: Insufficient documentation

## 2012-12-23 DIAGNOSIS — O26843 Uterine size-date discrepancy, third trimester: Secondary | ICD-10-CM

## 2012-12-23 DIAGNOSIS — O0930 Supervision of pregnancy with insufficient antenatal care, unspecified trimester: Secondary | ICD-10-CM | POA: Insufficient documentation

## 2012-12-23 DIAGNOSIS — O09299 Supervision of pregnancy with other poor reproductive or obstetric history, unspecified trimester: Secondary | ICD-10-CM | POA: Insufficient documentation

## 2012-12-23 DIAGNOSIS — O26849 Uterine size-date discrepancy, unspecified trimester: Secondary | ICD-10-CM | POA: Insufficient documentation

## 2012-12-26 ENCOUNTER — Ambulatory Visit (INDEPENDENT_AMBULATORY_CARE_PROVIDER_SITE_OTHER): Payer: Medicaid Other | Admitting: Obstetrics & Gynecology

## 2012-12-26 VITALS — BP 136/84 | Temp 97.9°F | Wt 240.0 lb

## 2012-12-26 DIAGNOSIS — K219 Gastro-esophageal reflux disease without esophagitis: Secondary | ICD-10-CM

## 2012-12-26 DIAGNOSIS — IMO0002 Reserved for concepts with insufficient information to code with codable children: Secondary | ICD-10-CM

## 2012-12-26 DIAGNOSIS — O368390 Maternal care for abnormalities of the fetal heart rate or rhythm, unspecified trimester, not applicable or unspecified: Secondary | ICD-10-CM

## 2012-12-26 LAB — POCT URINALYSIS DIP (DEVICE)
Bilirubin Urine: NEGATIVE
Glucose, UA: NEGATIVE mg/dL
Nitrite: NEGATIVE
Urobilinogen, UA: 1 mg/dL (ref 0.0–1.0)

## 2012-12-26 NOTE — Progress Notes (Signed)
Pulse- 80  Edema-feet  Pain/pressure-lower abd Pt has lost 9 lbs since last visit

## 2012-12-26 NOTE — Progress Notes (Signed)
Audible deceleration.  NST today.  Induce at 40 weeks due to 15% growth.  NST reactive.

## 2012-12-27 ENCOUNTER — Encounter (HOSPITAL_COMMUNITY): Payer: Self-pay | Admitting: *Deleted

## 2012-12-27 ENCOUNTER — Telehealth (HOSPITAL_COMMUNITY): Payer: Self-pay | Admitting: *Deleted

## 2012-12-27 NOTE — Telephone Encounter (Signed)
Preadmission screen  

## 2013-01-03 ENCOUNTER — Inpatient Hospital Stay (HOSPITAL_COMMUNITY)
Admission: RE | Admit: 2013-01-03 | Discharge: 2013-01-05 | DRG: 775 | Disposition: A | Discharge status: Home / Self Care | Payer: Medicaid Other | Source: Ambulatory Visit | Attending: Obstetrics & Gynecology | Admitting: Obstetrics & Gynecology

## 2013-01-03 ENCOUNTER — Encounter (HOSPITAL_COMMUNITY): Payer: Self-pay | Admitting: Anesthesiology

## 2013-01-03 ENCOUNTER — Encounter (HOSPITAL_COMMUNITY): Payer: Self-pay

## 2013-01-03 ENCOUNTER — Inpatient Hospital Stay (HOSPITAL_COMMUNITY): Payer: Medicaid Other | Admitting: Anesthesiology

## 2013-01-03 VITALS — BP 139/81 | HR 72 | Temp 97.9°F | Resp 16 | Ht 67.0 in | Wt 247.0 lb

## 2013-01-03 DIAGNOSIS — O99892 Other specified diseases and conditions complicating childbirth: Principal | ICD-10-CM | POA: Diagnosis present

## 2013-01-03 DIAGNOSIS — O365991 Maternal care for other known or suspected poor fetal growth, unspecified trimester, fetus 1: Secondary | ICD-10-CM

## 2013-01-03 DIAGNOSIS — Z22330 Carrier of Group B streptococcus: Secondary | ICD-10-CM

## 2013-01-03 DIAGNOSIS — O9989 Other specified diseases and conditions complicating pregnancy, childbirth and the puerperium: Secondary | ICD-10-CM

## 2013-01-03 HISTORY — DX: Gonococcal infection, unspecified: A54.9

## 2013-01-03 LAB — CBC
MCH: 29.6 pg (ref 26.0–34.0)
MCH: 29.5 pg (ref 26.0–34.0)
MCH: 29.8 pg (ref 26.0–34.0)
MCHC: 33.2 g/dL (ref 30.0–36.0)
MCHC: 33 g/dL (ref 30.0–36.0)
MCHC: 33.4 g/dL (ref 30.0–36.0)
MCV: 89.2 fL (ref 78.0–100.0)
MCV: 89.3 fL (ref 78.0–100.0)
Platelets: 133 10*3/uL — ABNORMAL LOW (ref 150–400)
Platelets: 136 10*3/uL — ABNORMAL LOW (ref 150–400)
Platelets: 137 10*3/uL — ABNORMAL LOW (ref 150–400)
RBC: 3.78 MIL/uL — ABNORMAL LOW (ref 3.87–5.11)
RBC: 3.73 MIL/uL — ABNORMAL LOW (ref 3.87–5.11)
RDW: 14.1 % (ref 11.5–15.5)
WBC: 12.4 10*3/uL — ABNORMAL HIGH (ref 4.0–10.5)

## 2013-01-03 LAB — COMPREHENSIVE METABOLIC PANEL
Alkaline Phosphatase: 178 U/L — ABNORMAL HIGH (ref 39–117)
BUN: 5 mg/dL — ABNORMAL LOW (ref 6–23)
CO2: 21 mEq/L (ref 19–32)
GFR calc Af Amer: >90 mL/min (ref >90)
GFR calc non Af Amer: >90 mL/min (ref >90)
Glucose, Bld: 110 mg/dL — ABNORMAL HIGH (ref 70–99)
Potassium: 3.2 mEq/L — ABNORMAL LOW (ref 3.5–5.1)
Total Protein: 6.3 g/dL (ref 6.0–8.3)

## 2013-01-03 LAB — PROTEIN / CREATININE RATIO, URINE: Creatinine, Urine: 65.78 mg/dL

## 2013-01-03 LAB — TYPE AND SCREEN: ABO/RH(D): A POS

## 2013-01-03 MED ORDER — DIBUCAINE 1 % RE OINT: 1.0000 "application " | TOPICAL_OINTMENT | RECTAL | Status: DC | PRN

## 2013-01-03 MED ORDER — SIMETHICONE 80 MG PO CHEW
80.0000 mg | CHEWABLE_TABLET | ORAL | Status: DC | PRN
  Administered 2013-01-04: 80 mg via ORAL

## 2013-01-03 MED ORDER — OXYTOCIN BOLUS FROM INFUSION
500.0000 mL | INTRAVENOUS | Status: DC
  Administered 2013-01-03: 500 mL via INTRAVENOUS

## 2013-01-03 MED ORDER — LANOLIN HYDROUS EX OINT: TOPICAL_OINTMENT | CUTANEOUS | Status: DC | PRN

## 2013-01-03 MED ORDER — FENTANYL 2.5 MCG/ML BUPIVACAINE 1/10 % EPIDURAL INFUSION (WH - ANES)
INTRAMUSCULAR | Status: DC | PRN
  Administered 2013-01-03: 14 mL/h via EPIDURAL

## 2013-01-03 MED ORDER — CITRIC ACID-SODIUM CITRATE 334-500 MG/5ML PO SOLN: 30.0000 mL | ORAL | Status: DC | PRN

## 2013-01-03 MED ORDER — WITCH HAZEL-GLYCERIN EX PADS
1.0000 "application " | MEDICATED_PAD | CUTANEOUS | Status: DC | PRN
  Administered 2013-01-04: 1 via TOPICAL

## 2013-01-03 MED ORDER — PRENATAL MULTIVITAMIN CH
1.0000 | ORAL_TABLET | Freq: Every day | ORAL | Status: DC
  Administered 2013-01-04: 1 via ORAL
  Filled 2013-01-03: qty 1

## 2013-01-03 MED ORDER — DIPHENHYDRAMINE HCL 25 MG PO CAPS: 25.0000 mg | ORAL_CAPSULE | Freq: Four times a day (QID) | ORAL | Status: DC | PRN

## 2013-01-03 MED ORDER — ONDANSETRON HCL 4 MG/2ML IJ SOLN: 4.0000 mg | Freq: Four times a day (QID) | INTRAMUSCULAR | Status: DC | PRN

## 2013-01-03 MED ORDER — MISOPROSTOL 25 MCG QUARTER TABLET
25.0000 ug | ORAL_TABLET | ORAL | Status: DC | PRN
  Administered 2013-01-03 (×2): 25 ug via VAGINAL
  Filled 2013-01-03: qty 1
  Filled 2013-01-03 (×2): qty 0.25

## 2013-01-03 MED ORDER — ZOLPIDEM TARTRATE 5 MG PO TABS: 5.0000 mg | ORAL_TABLET | Freq: Every evening | ORAL | Status: DC | PRN

## 2013-01-03 MED ORDER — LACTATED RINGERS IV SOLN: 500.0000 mL | INTRAVENOUS | Status: DC | PRN

## 2013-01-03 MED ORDER — LIDOCAINE HCL (PF) 1 % IJ SOLN
INTRAMUSCULAR | Status: DC | PRN
  Administered 2013-01-03 (×2): 4 mL

## 2013-01-03 MED ORDER — EPHEDRINE 5 MG/ML INJ
10.0000 mg | INTRAVENOUS | Status: DC | PRN
  Filled 2013-01-03: qty 2

## 2013-01-03 MED ORDER — IBUPROFEN 600 MG PO TABS
600.0000 mg | ORAL_TABLET | Freq: Four times a day (QID) | ORAL | Status: DC | PRN
  Administered 2013-01-03: 600 mg via ORAL
  Filled 2013-01-03: qty 1

## 2013-01-03 MED ORDER — LACTATED RINGERS IV SOLN: 500.0000 mL | Freq: Once | INTRAVENOUS | Status: DC

## 2013-01-03 MED ORDER — OXYCODONE-ACETAMINOPHEN 5-325 MG PO TABS: 1.0000 | ORAL_TABLET | ORAL | Status: DC | PRN

## 2013-01-03 MED ORDER — SENNOSIDES-DOCUSATE SODIUM 8.6-50 MG PO TABS
2.0000 | ORAL_TABLET | Freq: Every day | ORAL | Status: DC
  Administered 2013-01-04: 2 via ORAL

## 2013-01-03 MED ORDER — LIDOCAINE HCL (PF) 1 % IJ SOLN
30.0000 mL | INTRAMUSCULAR | Status: DC | PRN
  Filled 2013-01-03 (×2): qty 30

## 2013-01-03 MED ORDER — PHENYLEPHRINE 40 MCG/ML (10ML) SYRINGE FOR IV PUSH (FOR BLOOD PRESSURE SUPPORT)
80.0000 ug | PREFILLED_SYRINGE | INTRAVENOUS | Status: DC | PRN
  Filled 2013-01-03: qty 2

## 2013-01-03 MED ORDER — PENICILLIN G POTASSIUM 5000000 UNITS IJ SOLR
5.0000 10*6.[IU] | Freq: Once | INTRAVENOUS | Status: AC
  Administered 2013-01-03: 5 10*6.[IU] via INTRAVENOUS
  Filled 2013-01-03: qty 5

## 2013-01-03 MED ORDER — DIPHENHYDRAMINE HCL 50 MG/ML IJ SOLN: 12.5000 mg | INTRAMUSCULAR | Status: DC | PRN

## 2013-01-03 MED ORDER — ACETAMINOPHEN 325 MG PO TABS: 650.0000 mg | ORAL_TABLET | ORAL | Status: DC | PRN

## 2013-01-03 MED ORDER — ONDANSETRON HCL 4 MG/2ML IJ SOLN: 4.0000 mg | INTRAMUSCULAR | Status: DC | PRN

## 2013-01-03 MED ORDER — BENZOCAINE-MENTHOL 20-0.5 % EX AERO
1.0000 "application " | INHALATION_SPRAY | CUTANEOUS | Status: DC | PRN
  Administered 2013-01-04: 1 via TOPICAL
  Filled 2013-01-03: qty 56

## 2013-01-03 MED ORDER — OXYTOCIN 40 UNITS IN LACTATED RINGERS INFUSION - SIMPLE MED
62.5000 mL/h | INTRAVENOUS | Status: DC
  Filled 2013-01-03: qty 1000

## 2013-01-03 MED ORDER — PENICILLIN G POTASSIUM 5000000 UNITS IJ SOLR
2.5000 10*6.[IU] | INTRAVENOUS | Status: DC
  Administered 2013-01-03 (×2): 2.5 10*6.[IU] via INTRAVENOUS
  Filled 2013-01-03 (×5): qty 2.5

## 2013-01-03 MED ORDER — ONDANSETRON HCL 4 MG PO TABS: 4.0000 mg | ORAL_TABLET | ORAL | Status: DC | PRN

## 2013-01-03 MED ORDER — EPHEDRINE 5 MG/ML INJ
10.0000 mg | INTRAVENOUS | Status: DC | PRN
  Filled 2013-01-03: qty 2
  Filled 2013-01-03: qty 4

## 2013-01-03 MED ORDER — TETANUS-DIPHTH-ACELL PERTUSSIS 5-2.5-18.5 LF-MCG/0.5 IM SUSP: 0.5000 mL | Freq: Once | INTRAMUSCULAR | Status: DC

## 2013-01-03 MED ORDER — PHENYLEPHRINE 40 MCG/ML (10ML) SYRINGE FOR IV PUSH (FOR BLOOD PRESSURE SUPPORT)
80.0000 ug | PREFILLED_SYRINGE | INTRAVENOUS | Status: DC | PRN
  Filled 2013-01-03: qty 2
  Filled 2013-01-03: qty 5

## 2013-01-03 MED ORDER — IBUPROFEN 600 MG PO TABS
600.0000 mg | ORAL_TABLET | Freq: Four times a day (QID) | ORAL | Status: DC
  Administered 2013-01-04 – 2013-01-05 (×5): 600 mg via ORAL
  Filled 2013-01-03 (×5): qty 1

## 2013-01-03 MED ORDER — FENTANYL 2.5 MCG/ML BUPIVACAINE 1/10 % EPIDURAL INFUSION (WH - ANES)
14.0000 mL/h | INTRAMUSCULAR | Status: DC | PRN
  Filled 2013-01-03: qty 125

## 2013-01-03 MED ORDER — MEASLES, MUMPS & RUBELLA VAC ~~LOC~~ INJ
0.5000 mL | INJECTION | Freq: Once | SUBCUTANEOUS | Status: DC
  Filled 2013-01-03: qty 0.5

## 2013-01-03 MED ORDER — FLEET ENEMA 7-19 GM/118ML RE ENEM: 1.0000 | ENEMA | RECTAL | Status: DC | PRN

## 2013-01-03 MED ORDER — LACTATED RINGERS IV SOLN
INTRAVENOUS | Status: DC
  Administered 2013-01-03 (×3): via INTRAVENOUS

## 2013-01-03 MED ORDER — TERBUTALINE SULFATE 1 MG/ML IJ SOLN: 0.2500 mg | Freq: Once | INTRAMUSCULAR | Status: DC | PRN

## 2013-01-03 NOTE — H&P (Signed)
Brihana SHATOYA ROETS is a 27 y.o. female [phone] with IUP at [redacted]w[redacted]d presenting for IOL for EFW 15% with head and ac circumference <5%.[facility]land [facility] 18 wks  Prenatal History/Complications: Partial previa, resolved 2nd Trimester pyelectasis, resolved (no genetic screenings) Hx A1DM previous pregnancy  Past Medical History: Past Medical History  Diagnosis Date  . Tachycardia   . Genital warts   . No pertinent past medical history   . Gestational diabetes     first pregnancy    Past Surgical History: Past Surgical History  Procedure Laterality Date  . Removal of warts      Obstetrical History: OB History   Grav Para Term Preterm Abortions TAB SAB Ect Mult Living   4 2 2  0 1 0 1 0 0 2        Social History: History   Social History  . Marital Status: Single    Spouse Name: N/A    Number of Children: N/A  . Years of Education: N/A   Social History Main Topics  . Smoking status: Current Every Day Smoker -- 0.75 packs/day for 15 years    Types: Cigarettes  . Smokeless tobacco: Never Used  . Alcohol Use: No  . Drug Use: Yes    Special: Marijuana     Comment: last use July 12th 2014  . Sexual Activity: Yes    Birth Control/ Protection: None   Other Topics Concern  . Not on file   Social History Narrative  . No narrative on file    Family History: Family History  Problem Relation Age of Onset  . Diabetes Mother   . Hypertension Mother   . Depression Mother   . Anxiety disorder Mother   . Diabetes Maternal Grandmother   . Hypertension Maternal Grandmother     Allergies: No Known Allergies  Prescriptions prior to admission  Medication Sig Dispense Refill  . acetaminophen (TYLENOL) 325 MG tablet Take 650 mg by mouth every 6 (six) hours as needed for pain.      Marland Kitchen omeprazole (PRILOSEC OTC) 20 MG tablet Take 1 tablet (20 mg total) by mouth daily.  30 tablet  3     Review of Systems   Constitutional: Negative for fever and chills Eyes: Negative  for visual disturbances Respiratory: Negative for shortness of breath, dyspnea Cardiovascular: Negative for chest pain or palpitations  Gastrointestinal: Negative for vomiting, diarrhea and constipation Genitourinary: Negative for dysuria and urgency Musculoskeletal: Negative for back pain, joint pain, myalgias  Neurological: Negative for dizziness and headaches      Blood pressure 139/85, pulse 70, temperature 98 F (36.7 C), temperature source Oral, height 5\' 7"  (1.702 m), weight 112.038 kg (247 lb), last menstrual period 02/19/2012. General appearance: alert, cooperative and no distress Lungs: clear to auscultation bilaterally Heart: regular rate and rhythm Abdomen: soft, non-tender; bowel sounds normal Pelvic: 1-2/thick-3/mod consistency Extremities: Homans sign is negative, no sign of DVT DTR's 2+ Presentation: cephalic Fetal monitoringBaseline: 130 bpm, Variability: Good {> 6 bpm), Accelerations: Reactive and Decelerations: Absent Uterine activity occ mild      Prenatal labs: ABO, Rh: A/POS/-- (04/02 1101) Antibody: NEG (04/02 1101) Rubella:  immune RPR: NON REAC (04/02 1101)  HBsAg: NEGATIVE (04/02 1101)  HIV: NON REACTIVE (04/02 1101)  GBS: Positive (08/07 0000)  1 hr Glucola 139; normal 3 hour Genetic screening  declined Anatomy US 2nd trimester renal pyelectasis, resolved    Assessment: Stevana ARTHELLA HEADINGS is a 27 y.o. [id number] with an IUP at  [redacted]w[redacted]d presenting for IOL for EFW 15 % with HC and AC <5%  Plan: GBS prophylaxis cytotec->probably Foley/pitocin   CRESENZO-DISHMAN,Lacey Dotson 01/03/2013, 7:57 AM

## 2013-01-03 NOTE — Anesthesia Procedure Notes (Signed)
Epidural Patient location during procedure: OB Start time: 01/03/2013 5:34 PM  Staffing Anesthesiologist: Dajsha Massaro A. Performed by: anesthesiologist   Preanesthetic Checklist Completed: patient identified, site marked, surgical consent, pre-op evaluation, timeout performed, IV checked, risks and benefits discussed and monitors and equipment checked  Epidural Patient position: sitting Prep: site prepped and draped and DuraPrep Patient monitoring: continuous pulse ox and blood pressure Approach: midline Injection technique: LOR air  Needle:  Needle type: Tuohy  Needle gauge: 17 G Needle length: 9 cm and 9 Needle insertion depth: 8 cm Catheter type: closed end flexible Catheter size: 19 Gauge Catheter at skin depth: 12 cm Test dose: negative and Other  Assessment Events: blood not aspirated, injection not painful, no injection resistance, negative IV test and no paresthesia  Additional Notes .Patient identified. Risks and benefits discussed including failed block, incomplete  Pain control, post dural puncture headache, nerve damage, paralysis, blood pressure Changes, nausea, vomiting, reactions to medications-both toxic and allergic and post Partum back pain. All questions were answered. Patient expressed understanding and wished to proceed. Sterile technique was used throughout procedure. Epidural site was Dressed with sterile barrier dressing. No paresthesias, signs of intravascular injection Or signs of intrathecal spread were encountered.  Patient was more comfortable after the epidural was dosed. Please see RN's note for documentation of vital signs and FHR which are stable.

## 2013-01-03 NOTE — Anesthesia Preprocedure Evaluation (Signed)
Anesthesia Evaluation  Patient identified by MRN, date of birth, ID band Patient awake    Reviewed: Allergy & Precautions, H&P , Patient's Chart, lab work & pertinent test results  Airway Mallampati: III TM Distance: >3 FB Neck ROM: Full    Dental no notable dental hx. (+) Teeth Intact   Pulmonary neg pulmonary ROS,  breath sounds clear to auscultation  Pulmonary exam normal       Cardiovascular negative cardio ROS  Rhythm:Regular Rate:Normal     Neuro/Psych negative neurological ROS  negative psych ROS   GI/Hepatic Neg liver ROS, GERD-  Medicated and Controlled,  Endo/Other  diabetes, Well Controlled, GestationalObesity  Renal/GU negative Renal ROS  negative genitourinary   Musculoskeletal negative musculoskeletal ROS (+)   Abdominal   Peds  Hematology negative hematology ROS (+)   Anesthesia Other Findings   Reproductive/Obstetrics (+) Pregnancy Hx/o STD Genital Warts                           Anesthesia Physical Anesthesia Plan  ASA: III  Anesthesia Plan: Epidural   Post-op Pain Management:    Induction:   Airway Management Planned: Natural Airway  Additional Equipment:   Intra-op Plan:   Post-operative Plan:   Informed Consent: I have reviewed the patients History and Physical, chart, labs and discussed the procedure including the risks, benefits and alternatives for the proposed anesthesia with the patient or authorized representative who has indicated his/her understanding and acceptance.     Plan Discussed with: Anesthesiologist  Anesthesia Plan Comments:         Anesthesia Quick Evaluation

## 2013-01-04 NOTE — Progress Notes (Signed)
Received patient from L&D RN at 2250, with report that patient had some elevated BPs while in labor. BP on admission here 134/58, at 0000 BP 147/68. Called Philipp Deputy CNM to clarify parameters for provider notification. Order received. Will continue to closely monitor patient.

## 2013-01-04 NOTE — Progress Notes (Signed)
Post Partum Day #1 Subjective: no complaints and up ad lib; breastfeeding going well; desires Nexplanon for contraception  Objective: Blood pressure 133/67, pulse 63, temperature 97.9 F (36.6 C), temperature source Oral, resp. rate 18, height 5\' 7"  (1.702 m), weight 112.038 kg (247 lb), last menstrual period 02/19/2012, SpO2 99.00%, unknown if currently breastfeeding.  Physical Exam:  General: alert, cooperative and mild distress Lungs: nl effort Heart: RRR Lochia: appropriate Uterine Fundus: firm DVT Evaluation: No evidence of DVT seen on physical exam.   Recent Labs  01/03/13 1603 01/03/13 2154  HGB 11.3* 11.1*  HCT 34.2* 33.2*    Assessment/Plan: Plan for discharge tomorrow   LOS: 1 day   Cam Hai 01/04/2013, 7:35 AM

## 2013-01-04 NOTE — Anesthesia Postprocedure Evaluation (Signed)
Anesthesia Post Note  Patient: Dorothy Meadows  Procedure(s) Performed: * No procedures listed *  Anesthesia type: Epidural  Patient location: Mother/Baby  Post pain: Pain level controlled  Post assessment: Post-op Vital signs reviewed  Last Vitals:  Filed Vitals:   01/04/13 0604  BP: 133/67  Pulse: 63  Temp: 36.6 C  Resp: 18    Post vital signs: Reviewed  Level of consciousness:alert  Complications: No apparent anesthesia complications

## 2013-01-05 MED ORDER — IBUPROFEN 600 MG PO TABS: 600.0000 mg | ORAL_TABLET | Freq: Four times a day (QID) | ORAL | Status: DC | PRN

## 2013-01-05 NOTE — Discharge Summary (Signed)
Attestation of Attending Supervision of Advanced Practitioner (CNM/NP): Evaluation and management procedures were performed by the Advanced Practitioner under my supervision and collaboration.  I have reviewed the Advanced Practitioner's note and chart, and I agree with the management and plan.  HARRAWAY-SMITH, Daquisha Clermont 10:37 AM

## 2013-01-05 NOTE — Progress Notes (Signed)
Clinical Social Work Department PSYCHOSOCIAL ASSESSMENT - MATERNAL/CHILD 01/05/2013  Patient:  Dorothy Meadows, Dorothy Meadows  Account Number:  [id number]  Admit Date:  01/03/2013  Marjo Bicker Name:   Sylvan Cheese    Clinical Social Worker:  Natarsha Hurwitz, LCSW   Date/Time:  01/05/2013 12:00 M  Date Referred:  01/05/2013   Referral source  CSW     Referred reason  Substance Abuse   Other referral source:    I:  FAMILY / HOME ENVIRONMENT Child's legal guardian:  PARENT  Guardian - Name Guardian - Age Guardian - Address  Lynell Greenhouse [address]  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  Father of baby (FOB) and several other visitors were present.  Parents are not married.  Mother lives alone with her 40 year old.  She has another child age 28 that is living with his grandmother.  Mother states that DSS was not involved in the decision for her son to live with grandmother.   She is unemployed and states that she is being supported by FOB and relatives.  She denies any hx of mental illness or family hx of mental  illness.  She reports use of cigarettes in the beginning of pregnancy and use of marijuana.  She reports last use of marijuana July 2014.  Urine drug screen on 9/6 was negative.  Meconium drug screen was ordered and is pending.  .  No acute social concerns related at this time.     VI SOCIAL WORK PLAN Social Work Plan  Information/Referral to [employer]   Information/referral to community resources comment:   Mother  reports intent to use [employer] for well baby care Mother reports plan to apply for [facility] and medicare  Other social work plan:   CSW will follow up with meconium drug screen and make needed referrals.    Sheila Gervasi J, LCSW

## 2013-01-05 NOTE — Discharge Summary (Signed)
Obstetric Discharge Summary Reason for Admission: induction of labor d/t efw 15%, lagging ac and hc Prenatal Procedures: NST and ultrasound Intrapartum Procedures: spontaneous vaginal delivery Postpartum Procedures: none Complications-Operative and Postpartum: periurethral laceration Eating, drinking, voiding, ambulating well.  +flatus.  Lochia and pain wnl.  Denies dizziness, lightheadedness, or sob. No complaints.  Hemoglobin  Date Value Range Status  01/03/2013 11.1* 12.0 - 15.0 g/dL Final     HCT  Date Value Range Status  01/03/2013 33.2* 36.0 - 46.0 % Final    Physical Exam:  General: alert, cooperative and no distress Lochia: appropriate Uterine Fundus: firm Incision: n/a DVT Evaluation: No evidence of DVT seen on physical exam. Negative Homan's sign. No cords or calf tenderness. No significant calf/ankle edema.  Discharge Diagnoses: Term Pregnancy-delivered  Discharge Information: Date: 01/05/2013 Activity: pelvic rest Diet: routine Medications: PNV and Ibuprofen Condition: stable Instructions: refer to practice specific booklet Discharge to: home Follow-up Information   Follow up with [facility]. Schedule an appointment as soon as possible for a visit in 6 weeks. (for postpartum visit)    Specialty:  Obstetrics and Gynecology   Contact information:   [address] [place] [address] [phone]      Newborn Data: Live born female  Birth Weight: 6 lb 8.4 oz (2960 g) APGAR: 9, 10  Home with mother. Breastfeeding, nexplanon for contraception  Dorothy Meadows 01/05/2013, 9:13 AM

## 2013-01-06 NOTE — Progress Notes (Signed)
Post discharge chart review completed.  

## 2013-02-20 ENCOUNTER — Encounter: Payer: Self-pay | Admitting: Obstetrics & Gynecology

## 2013-02-20 ENCOUNTER — Ambulatory Visit (INDEPENDENT_AMBULATORY_CARE_PROVIDER_SITE_OTHER): Payer: Medicaid Other | Admitting: Obstetrics & Gynecology

## 2013-02-20 DIAGNOSIS — I10 Essential (primary) hypertension: Secondary | ICD-10-CM

## 2013-02-20 MED ORDER — HYDROCHLOROTHIAZIDE 25 MG PO TABS: 25.0000 mg | ORAL_TABLET | Freq: Every day | ORAL | Status: DC

## 2013-02-20 NOTE — Progress Notes (Unsigned)
Patient ID: Dorothy Meadows, female   DOB: [birth year], 27 y.o.   MRN: [id number] Subjective:     Dorothy Meadows is a 27 y.o. female who presents for a postpartum visit. She is 7 weeks postpartum following a spontaneous vaginal delivery. I have fully reviewed the prenatal and intrapartum course. The delivery was at 40.3 gestational weeks. Outcome: spontaneous vaginal delivery. Anesthesia: epidural. Postpartum course has been normal. Baby's course has been good. Baby is feeding by bottle -  . Bleeding no bleeding. Bowel function is normal. Bladder function is normal. Patient is not sexually active. Contraception method is Nexplanon. Postpartum depression screening: negative.  The following portions of the patient's history were reviewed and updated as appropriate: allergies, current medications, past family history, past medical history, past social history, past surgical history and problem list.  Review of Systems Pertinent items are noted in HPI.   Objective:    BP 154/104  Pulse 80  Temp(Src) 98.1 F (36.7 C)  Ht 5\' 9"  (1.753 m)  Wt 232 lb (105.235 kg)  BMI 34.24 kg/m2  LMP 02/16/2013  Breastfeeding? No  General:  alert, cooperative and no distress   Breasts:     Lungs:    Heart:     Abdomen: soft, non-tender; bowel sounds normal; no masses,  no organomegaly   Vulva:    Vagina: not evaluated  Cervix:     Corpus: not examined  Adnexa:  not evaluated  Rectal Exam: Not performed.        Assessment:     normal postpartum exam  With HTN. Pap smear not done at today's visit.   Plan:    1. Contraception: Nexplanon 2.  HCTZ 25 mg 3. Follow up in: 1 week or as needed.   Dorothy Phenix, MD 02/20/2013

## 2013-02-20 NOTE — Progress Notes (Unsigned)
Here for postpartum check. Patient denies headaches.

## 2013-02-27 ENCOUNTER — Ambulatory Visit (INDEPENDENT_AMBULATORY_CARE_PROVIDER_SITE_OTHER): Payer: Medicaid Other | Admitting: Obstetrics & Gynecology

## 2013-02-27 VITALS — BP 141/88 | HR 96 | Temp 97.9°F | Ht 69.0 in | Wt 236.0 lb

## 2013-02-27 DIAGNOSIS — Z30017 Encounter for initial prescription of implantable subdermal contraceptive: Secondary | ICD-10-CM

## 2013-02-27 LAB — POCT PREGNANCY, URINE: Preg Test, Ur: NEGATIVE

## 2013-02-27 MED ORDER — ETONOGESTREL 68 MG ~~LOC~~ IMPL
68.0000 mg | DRUG_IMPLANT | Freq: Once | SUBCUTANEOUS | Status: AC
  Administered 2013-02-27: 68 mg via SUBCUTANEOUS

## 2013-02-27 NOTE — Patient Instructions (Signed)

## 2013-02-27 NOTE — Progress Notes (Signed)
Patient ID: Dorothy Meadows, female   DOB: 03/24/1986, 27 y.o.   MRN: [id number] Patient given informed consent, signed copy in the chart, time out was performed. Pregnancy test was negative. Appropriate time out taken.  Patient's left arm was prepped and draped in the usual sterile fashion.. The ruler used to measure and mark insertion area.  Pt was prepped with alcohol swab and then injected with 5 cc of 1% lidocaine with epinephrine.  Pt was prepped with betadine,Nexplanon removed form packaging,  Device confirmed in needle, then inserted full length of needle and withdrawn per handbook instructions.  Pt insertion site covered with 4x4.   Minimal blood loss.  Pt tolerated the procedure well. Instructions given  Adam Phenix, MD 02/27/2013

## 2013-03-05 ENCOUNTER — Encounter: Payer: Self-pay | Admitting: *Deleted

## 2013-03-06 ENCOUNTER — Other Ambulatory Visit: Payer: Self-pay

## 2013-08-08 ENCOUNTER — Encounter (HOSPITAL_COMMUNITY): Payer: Self-pay | Admitting: Emergency Medicine

## 2013-08-08 ENCOUNTER — Emergency Department (HOSPITAL_COMMUNITY)
Admission: EM | Admit: 2013-08-08 | Discharge: 2013-08-08 | Disposition: A | Discharge status: Home / Self Care | Payer: Medicaid Other | Attending: Emergency Medicine | Admitting: Emergency Medicine

## 2013-08-08 DIAGNOSIS — F17200 Nicotine dependence, unspecified, uncomplicated: Secondary | ICD-10-CM | POA: Insufficient documentation

## 2013-08-08 DIAGNOSIS — IMO0002 Reserved for concepts with insufficient information to code with codable children: Secondary | ICD-10-CM | POA: Insufficient documentation

## 2013-08-08 DIAGNOSIS — Y9389 Activity, other specified: Secondary | ICD-10-CM | POA: Insufficient documentation

## 2013-08-08 DIAGNOSIS — M549 Dorsalgia, unspecified: Secondary | ICD-10-CM

## 2013-08-08 DIAGNOSIS — Z79899 Other long term (current) drug therapy: Secondary | ICD-10-CM | POA: Insufficient documentation

## 2013-08-08 DIAGNOSIS — Z3202 Encounter for pregnancy test, result negative: Secondary | ICD-10-CM | POA: Insufficient documentation

## 2013-08-08 DIAGNOSIS — Z8632 Personal history of gestational diabetes: Secondary | ICD-10-CM | POA: Insufficient documentation

## 2013-08-08 DIAGNOSIS — Z8619 Personal history of other infectious and parasitic diseases: Secondary | ICD-10-CM | POA: Insufficient documentation

## 2013-08-08 DIAGNOSIS — Y929 Unspecified place or not applicable: Secondary | ICD-10-CM | POA: Insufficient documentation

## 2013-08-08 DIAGNOSIS — X500XXA Overexertion from strenuous movement or load, initial encounter: Secondary | ICD-10-CM | POA: Insufficient documentation

## 2013-08-08 LAB — URINALYSIS, ROUTINE W REFLEX MICROSCOPIC
GLUCOSE, UA: NEGATIVE mg/dL
HGB URINE DIPSTICK: NEGATIVE
Ketones, ur: 15 mg/dL — AB
LEUKOCYTES UA: NEGATIVE
Nitrite: NEGATIVE
PH: 6 (ref 5.0–8.0)
PROTEIN: NEGATIVE mg/dL
Specific Gravity, Urine: 1.038 — ABNORMAL HIGH (ref 1.005–1.030)
Urobilinogen, UA: 1 mg/dL (ref 0.0–1.0)

## 2013-08-08 LAB — PREGNANCY, URINE: PREG TEST UR: NEGATIVE

## 2013-08-08 MED ORDER — DEXAMETHASONE SODIUM PHOSPHATE 10 MG/ML IJ SOLN
10.0000 mg | Freq: Once | INTRAMUSCULAR | Status: AC
Start: 2013-08-08 — End: 2013-08-08
  Administered 2013-08-08: 10 mg via INTRAMUSCULAR
  Filled 2013-08-08: qty 1

## 2013-08-08 MED ORDER — CYCLOBENZAPRINE HCL 5 MG PO TABS: 5.0000 mg | ORAL_TABLET | Freq: Three times a day (TID) | ORAL | Status: DC | PRN

## 2013-08-08 MED ORDER — NAPROXEN 500 MG PO TABS: 500.0000 mg | ORAL_TABLET | Freq: Two times a day (BID) | ORAL | Status: DC

## 2013-08-08 MED ORDER — KETOROLAC TROMETHAMINE 30 MG/ML IJ SOLN
30.0000 mg | Freq: Once | INTRAMUSCULAR | Status: AC
  Administered 2013-08-08: 30 mg via INTRAMUSCULAR
  Filled 2013-08-08: qty 1

## 2013-08-08 NOTE — ED Notes (Signed)
PA at bedside.

## 2013-08-08 NOTE — ED Provider Notes (Signed)
CSN: [id number]     Arrival date & time 08/08/13  [id number] History  This chart was scribed for non-physician practitioner, Coral CeoJessica Anila Bojarski, PA, working with Ethelda ChickMartha K Linker, MD by Marica OtterNusrat Rahman, ED Scribe. This patient was seen in room TR07C/TR07C and the patient's care was started at 10:14 PM.  PCP: No PCP Per Patient  Chief Complaint  Patient presents with  . Back Pain   The history is provided by the patient. No language interpreter was used.   HPI Comments: Dorothy Meadows is a 28 y.o. female with a history of tachycardia, genital warts, gonorrhea, and gestational diabetes who presents to the Emergency Department complaining of back pain. Patient states her pain began 6 days ago after bending down and picking her daughter out of her stroller. No blunt trauma, falls, or injuries. Pain has been gradually worsening since this time. Pain is intermittent, located in her left lower back, worse with movement, and is described as an aching pain. No dysuria, abdominal pain, nausea, emesis, vaginal discharge/bleeding, numbness/tingling, weakness, loss of sensation, or loss of bowel/bladder function. Patient also has constant upper right back pain between her shoulder blades which started today after twisting her back. Pain is worse with deep breathing & movement and is described as a sharp & aching pain. Patient denies any SOB, chest pain, cough. Patient reports that she took Advil at 1pm today with no relief. Pt reports she is currently on birth control. Pt further reports that her last menstrual period was 5 months ago. Patient otherwise has been well with no fevers, chills, night sweats, weight loss, or change in appetite/activity.   Past Medical History  Diagnosis Date  . Tachycardia   . Genital warts   . No pertinent past medical history   . Gestational diabetes     first pregnancy  . Gonorrhea     1st trimester this pregnancy   Past Surgical History  Procedure Laterality Date  . Removal of  warts     Family History  Problem Relation Age of Onset  . Diabetes Mother   . Hypertension Mother   . Depression Mother   . Anxiety disorder Mother   . Diabetes Maternal Grandmother   . Hypertension Maternal Grandmother    History  Substance Use Topics  . Smoking status: Current Every Day Smoker -- 0.75 packs/day for 15 years    Types: Cigarettes  . Smokeless tobacco: Never Used  . Alcohol Use: No   OB History   Grav Para Term Preterm Abortions TAB SAB Ect Mult Living   4 3 3  0 1 0 1 0 0 3     Review of Systems  Constitutional: Negative for fever, chills, diaphoresis, activity change, appetite change and fatigue.  Respiratory: Negative for cough and shortness of breath.   Cardiovascular: Negative for chest pain.  Gastrointestinal: Negative for nausea, vomiting, abdominal pain, diarrhea and constipation.  Genitourinary: Negative for dysuria, hematuria, vaginal bleeding, vaginal discharge, difficulty urinating, vaginal pain and pelvic pain.  Musculoskeletal: Positive for back pain. Negative for arthralgias, gait problem, joint swelling, myalgias and neck pain.       Lower back pain and upper back pain  Skin: Negative for color change and wound.  Neurological: Negative for dizziness, weakness, light-headedness, numbness and headaches.  All other systems reviewed and are negative.   Allergies  Review of patient's allergies indicates no known allergies.  Home Medications   Current Outpatient Rx  Name  Route  Sig  Dispense  Refill  .  acetaminophen (TYLENOL) 325 MG tablet   Oral   Take 650 mg by mouth every 6 (six) hours as needed for pain.         . hydrochlorothiazide (HYDRODIURIL) 25 MG tablet   Oral   Take 1 tablet (25 mg total) by mouth daily.   30 tablet   3   . ibuprofen (ADVIL,MOTRIN) 600 MG tablet   Oral   Take 1 tablet (600 mg total) by mouth every 6 (six) hours as needed for pain.   30 tablet   0   . omeprazole (PRILOSEC OTC) 20 MG tablet   Oral    Take 1 tablet (20 mg total) by mouth daily.   30 tablet   3    Triage Vitals: BP 141/89  Pulse 73  Temp(Src) 98.9 F (37.2 C) (Oral)  Resp 18  Ht 5\' 9"  (1.753 m)  Wt 255 lb 6 oz (115.837 kg)  BMI 37.69 kg/m2  SpO2 100%  Filed Vitals:   08/08/13 1919 08/08/13 2316  BP: 141/89 120/71  Pulse: 73 71  Temp: 98.9 F (37.2 C) 98.7 F (37.1 C)  TempSrc: Oral   Resp: 18 20  Height: 5\' 9"  (1.753 m)   Weight: 255 lb 6 oz (115.837 kg)   SpO2: 100% 100%    Physical Exam  Nursing note and vitals reviewed. Constitutional: She is oriented to person, place, and time. She appears well-developed and well-nourished. No distress.  HENT:  Head: Normocephalic and atraumatic.  Right Ear: External ear normal.  Left Ear: External ear normal.  Mouth/Throat: Oropharynx is clear and moist.  Eyes: Conjunctivae and EOM are normal. Right eye exhibits no discharge. Left eye exhibits no discharge.  Neck: Normal range of motion. Neck supple. No tracheal deviation present.  No cervical spinal or paraspinal tenderness to palpation throughout.  No limitations with neck ROM.    Cardiovascular: Normal rate, regular rhythm, normal heart sounds and intact distal pulses.  Exam reveals no gallop and no friction rub.   No murmur heard. Dorsalis pedis pulses present and equal bilaterally  Pulmonary/Chest: Effort normal. No respiratory distress. She has no wheezes. She has no rales. She exhibits no tenderness.  Abdominal: Soft. She exhibits no distension and no mass. There is no tenderness. There is no rebound and no guarding.  Musculoskeletal: Normal range of motion. She exhibits tenderness. She exhibits no edema.       Back:  Tenderness to palpation to the left lower paraspinal muscles. No midline lumbar spinal tenderness. Negative straight leg raise. Tenderness to palpation to the right thoracic paraspinal muscles with no thoracic spinal tenderness. Pain to the upper back worse with ROM of the right shoulder. No  tenderness to palpation to the UE and LE throughout. Strength 5/5 in the upper and lower extremities bilaterally. Patient able to ambulate without difficulty or ataxia.   Neurological: She is alert and oriented to person, place, and time.  Patellar reflexes intact bilaterally. Sensation intact in the LE bilaterally  Skin: Skin is warm and dry. She is not diaphoretic.  Psychiatric: She has a normal mood and affect. Her behavior is normal.    ED Course  Procedures (including critical care time) DIAGNOSTIC STUDIES: Oxygen Saturation is 100% on RA, normal by my interpretation.    COORDINATION OF CARE:  10:20 PM-Discussed treatment plan which includes UA and meds  with pt at bedside and pt agreed to plan.   Labs Review Labs Reviewed - No data to display Imaging Review No results  found.   EKG Interpretation None      Results for orders placed during the hospital encounter of 08/08/13  URINALYSIS, ROUTINE W REFLEX MICROSCOPIC      Result Value Ref Range   Color, Urine AMBER (*) YELLOW   APPearance CLOUDY (*) CLEAR   Specific Gravity, Urine 1.038 (*) 1.005 - 1.030   pH 6.0  5.0 - 8.0   Glucose, UA NEGATIVE  NEGATIVE mg/dL   Hgb urine dipstick NEGATIVE  NEGATIVE   Bilirubin Urine SMALL (*) NEGATIVE   Ketones, ur 15 (*) NEGATIVE mg/dL   Protein, ur NEGATIVE  NEGATIVE mg/dL   Urobilinogen, UA 1.0  0.0 - 1.0 mg/dL   Nitrite NEGATIVE  NEGATIVE   Leukocytes, UA NEGATIVE  NEGATIVE  PREGNANCY, URINE      Result Value Ref Range   Preg Test, Ur NEGATIVE  NEGATIVE    MDM   Dorothy Meadows is a 28 y.o. female with a history of tachycardia, genital warts, gonorrhea, and gestational diabetes who presents to the Emergency Department complaining of back pain. Etiology of back pain likely musculoskeletal in nature: muscular sprain vs strain. No injuries or trauma. Pain worse with movement and reproducible. UA negative for UTI. Patient had improvements in her pain throughout her ED visit. No  warning signs or symptoms of back pain including loss of bowel or bladder control, night sweats, waking from sleep with back pain, unexplained fevers or weight loss, history of cancer, or IV drug use. No concern for cauda equina, epidural abscess, or other serious/life threatening cause of back pain. Doubt cardiopulmonary causes. Vital signs WNL. Instructed patient to follow-up with PCP. Return precautions, discharge instructions, and follow-up was discussed with the patient before discharge.     Rechecks  11:15 PM = Condition improved. Patient ready for discharge.    Discharge Medication List as of 08/08/2013 11:15 PM    START taking these medications   Details  cyclobenzaprine (FLEXERIL) 5 MG tablet Take 1 tablet (5 mg total) by mouth 3 (three) times daily as needed for muscle spasms., Starting 08/08/2013, Until Discontinued, Print    naproxen (NAPROSYN) 500 MG tablet Take 1 tablet (500 mg total) by mouth 2 (two) times daily with a meal., Starting 08/08/2013, Until Discontinued, Print         Final impressions: 1. Back pain       Thomasenia Sales   I personally performed the services described in this documentation, which was scribed in my presence. The recorded information has been reviewed and is accurate.       Jillyn Ledger, PA-C 08/10/13 1217

## 2013-08-08 NOTE — ED Notes (Signed)
C/o low back pain since last Saturday since lifting child out of strolled, gradually progressively worse, now affecting upper back b/w shoulder blades. (denies: nvd, sob, fever, urinary sx, vaginal sx, dizziness, bleeding or other sx), admits that with some deep breaths back hurts worse. advil taken at 1300 without relief.

## 2013-08-08 NOTE — Discharge Instructions (Signed)
Take naprosyn for pain - take with food  Take flexeril for muscle spasm - Please be careful with this medication.  It can cause drowsiness.  Use caution while driving, operating machinery, drinking alcohol, or any other activities that may impair your physical or mental abilities.   Return to the emergency department if you develop any changing/worsening condition, loss of bowel/bladder function, chest pain, difficulty breathing, fever, weakness, or any other concerns (please read additional information regarding your condition below)   Back Pain, Adult Low back pain is very common. About 1 in 5 people have back pain.The cause of low back pain is rarely dangerous. The pain often gets better over time.About half of people with a sudden onset of back pain feel better in just 2 weeks. About 8 in 10 people feel better by 6 weeks.  CAUSES Some common causes of back pain include:  Strain of the muscles or ligaments supporting the spine.  Wear and tear (degeneration) of the spinal discs.  Arthritis.  Direct injury to the back. DIAGNOSIS Most of the time, the direct cause of low back pain is not known.However, back pain can be treated effectively even when the exact cause of the pain is unknown.Answering your caregiver's questions about your overall health and symptoms is one of the most accurate ways to make sure the cause of your pain is not dangerous. If your caregiver needs more information, he or she may order lab work or imaging tests (X-rays or MRIs).However, even if imaging tests show changes in your back, this usually does not require surgery. HOME CARE INSTRUCTIONS For many people, back pain returns.Since low back pain is rarely dangerous, it is often a condition that people can learn to [facility] their own.   Remain active. It is stressful on the back to sit or stand in one place. Do not sit, drive, or stand in one place for more than 30 minutes at a time. Take short walks on level  surfaces as soon as pain allows.Try to increase the length of time you walk each day.  Do not stay in bed.Resting more than 1 or 2 days can delay your recovery.  Do not avoid exercise or work.Your body is made to move.It is not dangerous to be active, even though your back may hurt.Your back will likely heal faster if you return to being active before your pain is gone.  Pay attention to your body when you bend and lift. Many people have less discomfortwhen lifting if they bend their knees, keep the load close to their bodies,and avoid twisting. Often, the most comfortable positions are those that put less stress on your recovering back.  Find a comfortable position to sleep. Use a firm mattress and lie on your side with your knees slightly bent. If you lie on your back, put a pillow under your knees.  Only take over-the-counter or prescription medicines as directed by your caregiver. Over-the-counter medicines to reduce pain and inflammation are often the most helpful.Your caregiver may prescribe muscle relaxant drugs.These medicines help dull your pain so you can more quickly return to your normal activities and healthy exercise.  Put ice on the injured area.  Put ice in a plastic bag.  Place a towel between your skin and the bag.  Leave the ice on for 15-20 minutes, 03-04 times a day for the first 2 to 3 days. After that, ice and heat may be alternated to reduce pain and spasms.  Ask your caregiver about trying back  exercises and gentle massage. This may be of some benefit.  Avoid feeling anxious or stressed.Stress increases muscle tension and can worsen back pain.It is important to recognize when you are anxious or stressed and learn ways to manage it.Exercise is a great option. SEEK MEDICAL CARE IF:  You have pain that is not relieved with rest or medicine.  You have pain that does not improve in 1 week.  You have new symptoms.  You are generally not feeling  well. SEEK IMMEDIATE MEDICAL CARE IF:   You have pain that radiates from your back into your legs.  You develop new bowel or bladder control problems.  You have unusual weakness or numbness in your arms or legs.  You develop nausea or vomiting.  You develop abdominal pain.  You feel faint. Document Released: 04/17/2005 Document Revised: 10/17/2011 Document Reviewed: 09/05/2010 [facility] Patient Information 2014 [place], [place].   Emergency Department Resource Guide 1) Find a Doctor and Pay Out of Pocket Although you won't have to find out who is covered by your insurance plan, it is a good idea to ask around and get recommendations. You will then need to call the office and see if the doctor you have chosen will accept you as a new patient and what types of options they offer for patients who are self-pay. Some doctors offer discounts or will set up payment plans for their patients who do not have insurance, but you will need to ask so you aren't surprised when you get to your appointment.  2) Contact Your Local Health Department Not all health departments have doctors that can see patients for sick visits, but many do, so it is worth a call to see if yours does. If you don't know where your local health department is, you can check in your phone book. The CDC also has a tool to help you locate your state's health department, and many state websites also have listings of all of their local health departments.  3) Find a Walk-in Clinic If your illness is not likely to be very severe or complicated, you may want to try a walk in clinic. These are popping up all over the country in pharmacies, drugstores, and shopping centers. They're usually staffed by nurse practitioners or physician assistants that have been trained to treat common illnesses and complaints. They're usually fairly quick and inexpensive. However, if you have serious medical issues or chronic medical problems, these are  probably not your best option.  No Primary Care Doctor: - Call Health Connect at  [phone] - they can help you locate a primary care doctor that  accepts your insurance, provides certain services, etc. - Physician Referral Service- [phone]  Chronic Pain Problems: Organization         Address  Phone   Notes  [facility]  [phone]) [phone] Patients need to be referred by their primary care doctor.   Medication Assistance: Organization         Address  Phone   Notes  [facility] Medication [facility] [address] [place], [place] [phone]) [phone] --Must be a resident of [place] [facility] [place] -- Must have NO insurance coverage whatsoever (no Medicaid/ Medicare, etc.) -- The pt. MUST have a primary care doctor that directs their care regularly and follows them in the community   Med[facility]ist  [phone]   [employer]  [phone]    Agencies that provide inexpensive medical care: Organization  Address  Phone   Notes  [facility]  [phone]   [facility]    [phone]   [facility] [place], [place] [address] [phone]   [place] [address], [place] [phone]   Planned Parenthood    [phone]   [facility]    [phone]   [place] and [address], [place] Phone:  [phone], Fax:  [phone] Hours of Operation:  9 am - 6 pm, M-F.  Also accepts Medicaid/Medicare and self-pay.  [facility] for [address], [address], [place] Phone: [phone], Fax: [phone]. Hours of Operation:  8:30 am - 5:30 pm, M-F.  Also accepts Medicaid and self-pay.  [facility] [place] [address] [place] Phone: [phone]   [place], [place], [place] [phone], Ext. [phone] Mondays & Thursdays:  7-9 AM.  First 15 patients are seen on a first come, first serve basis.    [place] Providers:  Organization         Address  Phone   Notes  [facility] [address], [place] [phone] Also accepts self-pay patients.  [facility] [address], [place]  [phone]   [place], [address], [place] [phone]   [facility] [address], [place] [phone]   Lucianne Lei [address], [place]   [phone] Only accepts [place] Access [place] patients after they have their name applied to their card.   Self-Pay (no insurance) in [facility]:  Organization         Address  Phone   Notes  Sickle Cell Patients, [facility] [place] [phone]   [facility] [place] [phone]   [facility] [place]  [place], [place], [place] [phone]   [facility]/Dr. Osei-Bonsu  [address] [place] or [address], [place] [phone] Phone number for both [place] and [place] locations is the same.  Urgent Medical and [facility] [address], [place] [phone]   [facility] [address], [place] or [address] [phone]   [facility] [address] [place] [phone], phone; [phone], fax Sees patients 1st and 3rd Saturday of every month.  Must not qualify for public or private insurance (i.e. Medicaid, Medicare, [place] Health Choice, Veterans' Benefits)  Household income should be no more than 200% of the poverty level The clinic cannot treat you if you are pregnant or think you are pregnant  Sexually transmitted diseases are not treated at the clinic.     Dental Care: Organization         Address  Phone  Notes  [facility] Department of [facility] [phone] Accepts children up to age 66 who are enrolled in [place] or [place]; pregnant women with a Medicaid card; and children who have applied for Medicaid or [place] Health Choice, but were declined, whose parents can pay a reduced fee at time  of service.  [facility] Department of [facility]  [address], [place] [phone] Accepts children up to age 36 who are enrolled in [place] or [place]; pregnant women with a Medicaid card; and children who have applied for Medicaid or [place] Health Choice, but were declined, whose parents can pay a reduced fee at time of service.  [place] Adult Dental Access PROGRAM  [place] [phone] Patients are seen by appointment only. Walk-ins are not accepted. [place] will see patients 58 years of age and older. Monday - Tuesday (8am-5pm) Most Wednesdays (8:30-5pm) $30 per visit, cash only  [facility] Adult Dental Access PROGRAM  [address], [facility] [phone] Patients are seen by appointment only. Walk-ins are not accepted. [facility] will see patients 78 years of age and older. One Wednesday Evening (Monthly: Volunteer Based).  $30 per visit, cash only  [place]  [phone] for adults; Children under age 20, call Graduate Pediatric Dentistry at [phone]. Children aged 64-14, please call [phone] to request a pediatric application.  Dental services are provided in all areas of dental care including fillings, crowns and bridges, complete and partial dentures, implants, gum treatment, root canals, and extractions. Preventive care is also provided. Treatment is provided to both adults and children. Patients are selected via a lottery and there is often a waiting  list.   [facility] [address] [place]  [phone] [email or web address]   [facility] [address] [place], [place] [phone], Ext. [phone] Second and Fourth Thursday of each month, opens at 6:30 AM; Clinic ends at 9 AM.  Patients are seen on a first-come first-served basis, and a limited number are seen during each clinic.   [facility]  [address] Hillard Danker [place], [place] [phone]   Eligibility Requirements You must have lived in [place], [place], or [place] counties for at least the last three months.   You cannot be eligible for state or federal sponsored [employer], including [employer], [place], or [employer].   You generally cannot be eligible for healthcare insurance through your employer.    How to apply: Eligibility screenings are held every Tuesday and Wednesday afternoon from 1:00 pm until 4:00 pm. You do not need an appointment for the interview!  [facility] [address], [place], [place]   [place]  [place] Department  [place]  [phone]    Behavioral Health Resources in the Community: Intensive Outpatient Programs Organization         Address  Phone  Notes  [facility]. [address] [place], [place] [phone]   [facility]patient [address] [place], [place]   ADS: [facility] [address] [place], [place]   [place] [address],  [place], [place] or [phone]   Substance Abuse Resources Organization         Address  Phone  Notes  [facility] Drug Services  [phone]   [place]  [phone]   [facility] [place]   Chinita Pester  [phone]   Residential & Outpatient Substance Abuse Program   [phone]   Psychological Services Organization         Address  Phone  Notes  °[facility]  336- [phone]   °[facility]  336- [phone]   °[facility] [address], [place] 1-800-853-5163 or [phone]   ° °Mobile Crisis Teams °Organization         Address  Phone  Notes  °[facility], Mobile Crisis Care Unit  1-877-626-1772   °Assertive °Psychotherapeutic Services ° [address] [place], [place] [phone]   °Sharon DeEsch [address] °[place] [phone]   ° °Self-Help/Support Groups °Organization         Address  Phone             Notes  °[facility] [place] - variety of support groups  336- [phone] Call for more information  °Narcotics Anonymous (NA), [facility] [address], °[place]  2 meetings at this location  ° °Residential Treatment Programs °Organization         Address  Phone  Notes  °[facility] [address],    °[place]  1-866-801-8205   °[facility] ° [address], [place], [place] [phone]   °[facility] [address], [place] [phone] Admissions: 8am-3pm M-F  °[facility] [address]    °[place], [place] [phone]   °[facility] [address], [place], [place] [phone]   °[facility] [address]  °[place], [place] 336-285-9073   °[facility] - Intensive Outpatient [address], , [place] 336-852-3033   °[facility] ([facility] [address]  °[place], [place] 1-877-615-2722 or 336-784-9470   °[facility] ([facility]) [address] [place], [place] 336-227-7417 Accepts Medicaid  °[facility] [address]  ° [place] 1-800-659-3381 Substance Abuse/Addiction Treatment  ° °[place] Behavioral Health Resources °Organization          Address  Phone  Notes  °[facility]  (888) 581-9988   °Julie Brannon, PhD [address] [place], [place]   (336) 349-5553 or (336) 951-0000   °[facility]   [address] °[place], [place] (336) 349-4454   °[facility] [address], [place], [place] (336) 342-8316 Insurance/Medicaid/sponsorship through [facility]  °[facility] [address]                                    [place], [place] (336) 342-8316 Therapy/tele-psych/case  °[facility] [address]  ° [place], [place] (336) 349-2233    °Dr. Arfeen  (336) 349-4544   °[facility] [place]  [employer] [place] [facility] 1) [address], [place] °2) [address], [place] °3)  [address], [place] (336) 349-3220 °(336) 342-7768 ° °(336) 342-8140   °[place] Child Abuse Hotline (336) 342-1394 or (336) 342-3537 (After Hours)    ° ° ° °

## 2013-08-10 NOTE — ED Provider Notes (Signed)
Medical screening examination/treatment/procedure(s) were performed by non-physician practitioner and as supervising physician I was immediately available for consultation/collaboration.   EKG Interpretation None       Ethelda ChickMartha K Linker, MD 08/10/13 [phone]

## 2013-11-22 ENCOUNTER — Emergency Department (HOSPITAL_COMMUNITY)
Admission: EM | Admit: 2013-11-22 | Discharge: 2013-11-22 | Disposition: A | Discharge status: Home / Self Care | Payer: Medicaid Other | Attending: Emergency Medicine | Admitting: Emergency Medicine

## 2013-11-22 ENCOUNTER — Encounter (HOSPITAL_COMMUNITY): Payer: Self-pay | Admitting: Emergency Medicine

## 2013-11-22 DIAGNOSIS — R002 Palpitations: Secondary | ICD-10-CM | POA: Insufficient documentation

## 2013-11-22 DIAGNOSIS — I498 Other specified cardiac arrhythmias: Secondary | ICD-10-CM | POA: Diagnosis not present

## 2013-11-22 DIAGNOSIS — Z8619 Personal history of other infectious and parasitic diseases: Secondary | ICD-10-CM | POA: Diagnosis not present

## 2013-11-22 DIAGNOSIS — I471 Supraventricular tachycardia: Secondary | ICD-10-CM

## 2013-11-22 DIAGNOSIS — Z8632 Personal history of gestational diabetes: Secondary | ICD-10-CM | POA: Insufficient documentation

## 2013-11-22 DIAGNOSIS — R000 Tachycardia, unspecified: Secondary | ICD-10-CM | POA: Diagnosis present

## 2013-11-22 DIAGNOSIS — F17200 Nicotine dependence, unspecified, uncomplicated: Secondary | ICD-10-CM | POA: Insufficient documentation

## 2013-11-22 NOTE — ED Notes (Signed)
Pt sts " I want to get the fuck out of here, I ain't staying for 2 more hours".  Pt sts " the ambulance man already fixed me, I don't need to stay". Pt informed of risks of leaving and AMA. Dr. Bebe ShaggyWickline made aware and spoke with pt. Pt still wanting to leave.

## 2013-11-22 NOTE — ED Provider Notes (Signed)
CSN: [id number]     Arrival date & time 11/22/13  0848 History   First MD Initiated Contact with Patient 11/22/13 0901     Chief Complaint  Patient presents with  . Tachycardia  . Chest Pain     Patient is a 28 y.o. female presenting with palpitations. The history is provided by the patient.  Palpitations Palpitations quality:  Fast Onset quality:  Sudden Timing:  Constant Progression:  Resolved Chronicity:  Recurrent Relieved by: adenosine. Worsened by:  Nothing tried Associated symptoms: chest pain and shortness of breath   Associated symptoms: no back pain, no syncope and no vomiting   Risk factors comment:  H/o SVT Pt presents after episode of SVT She reports she woke up and felt her heart racing She reports due to this she had CP She attempted home maneuvers to stop the palpitations but nothing helped She called EMS.  EMS noted pt was in SVT.  She was given adenosine and this helped her symptoms completely.  She reports she has been given adenosine at least 3 times previously for similar episodes She does not take any daily medications for this She denies any other known cardiac complications She now feels at baseline  She admits to ETOH/cocaine/adderal use last night but went to bed feeling well then woke up with symptoms  No CP at the time of exam  Past Medical History  Diagnosis Date  . Tachycardia   . Genital warts   . No pertinent past medical history   . Gestational diabetes     first pregnancy  . Gonorrhea     1st trimester this pregnancy   Past Surgical History  Procedure Laterality Date  . Removal of warts     Family History  Problem Relation Age of Onset  . Diabetes Mother   . Hypertension Mother   . Depression Mother   . Anxiety disorder Mother   . Diabetes Maternal Grandmother   . Hypertension Maternal Grandmother    History  Substance Use Topics  . Smoking status: Current Every Day Smoker -- 0.50 packs/day for 15 years    Types: Cigarettes   . Smokeless tobacco: Never Used  . Alcohol Use: Yes     Comment: occasionally   OB History   Grav Para Term Preterm Abortions TAB SAB Ect Mult Living   4 3 3  0 1 0 1 0 0 3     Review of Systems  Constitutional: Negative for fever.  Respiratory: Positive for shortness of breath.   Cardiovascular: Positive for chest pain and palpitations. Negative for syncope.  Gastrointestinal: Negative for vomiting and abdominal pain.  Musculoskeletal: Negative for back pain.  Neurological: Negative for syncope.  All other systems reviewed and are negative.     Allergies  Review of patient's allergies indicates no known allergies.  Home Medications   Prior to Admission medications   Not on File   BP 107/89  Pulse 93  Temp(Src) 97.4 F (36.3 C) (Oral)  Resp 22  Ht 5\' 9"  (1.753 m)  Wt 250 lb (113.399 kg)  BMI 36.90 kg/m2  SpO2 97% Physical Exam CONSTITUTIONAL: Well developed/well nourished HEAD: Normocephalic/atraumatic EYES: EOMI/PERRL ENMT: Mucous membranes moist NECK: supple no meningeal signs SPINE:entire spine nontender CV: S1/S2 noted, no murmurs/rubs/gallops noted LUNGS: Lungs are clear to auscultation bilaterally, no apparent distress ABDOMEN: soft, nontender, no rebound or guarding GU:no cva tenderness NEURO: Pt is awake/alert, moves all extremitiesx4 EXTREMITIES: pulses normal, full ROM, no LE tenderness noted SKIN: warm,  color normal PSYCH: no abnormalities of mood noted  ED Course  Procedures   I reviewed EMS strips and likely did have SVT.  Rhythm terminated with one dose of adenosine.  Pt reports similar episodes previously  She is well appearing.  Her EKG is non-ischemic.  I doubt acute PE  I did offer to monitor in the ED and check electrolyte.  Soon after, pt refused all blood work, she cursed at nursing staff and demanded to be discharged.  I advised need for outpatient cardiology evaluation.  I also advised need to STOP drug/cigarette use as this likely  contributed to SVT  EMS rhythm strips photos are below for future cardiology use    EKG Interpretation   Date/Time:  Saturday November 22 2013 08:51:39 EDT Ventricular Rate:  93 PR Interval:  140 QRS Duration: 84 QT Interval:  349 QTC Calculation: 434 R Axis:   66 Text Interpretation:  Sinus rhythm Normal ECG No previous ECGs available  Confirmed by Bebe Shaggy  MD, Dorinda Hill ([id number]) on 11/22/2013 9:03:14 AM      MDM   Final diagnoses:  Palpitations  SVT (supraventricular tachycardia)    Nursing notes including past medical history and social history reviewed and considered in documentation                Joya Gaskins, MD 11/22/13 1024

## 2013-11-22 NOTE — ED Notes (Signed)
[facility] and CP. Pt stated that CP woke her up and she has a hx of SVT. EMS administered Adenosine 6mg  and pt now NSR with a rate of 92. Pt is A&Ox4 and denies any CP at this time.

## 2013-12-25 ENCOUNTER — Emergency Department (HOSPITAL_COMMUNITY)
Admission: EM | Admit: 2013-12-25 | Discharge: 2013-12-25 | Discharge status: Against Medical Advice | Payer: Medicaid Other | Attending: Emergency Medicine | Admitting: Emergency Medicine

## 2013-12-25 ENCOUNTER — Encounter (HOSPITAL_COMMUNITY): Payer: Self-pay | Admitting: Emergency Medicine

## 2013-12-25 DIAGNOSIS — F17200 Nicotine dependence, unspecified, uncomplicated: Secondary | ICD-10-CM | POA: Insufficient documentation

## 2013-12-25 DIAGNOSIS — R110 Nausea: Secondary | ICD-10-CM | POA: Diagnosis not present

## 2013-12-25 DIAGNOSIS — Z8619 Personal history of other infectious and parasitic diseases: Secondary | ICD-10-CM | POA: Insufficient documentation

## 2013-12-25 DIAGNOSIS — R000 Tachycardia, unspecified: Secondary | ICD-10-CM | POA: Insufficient documentation

## 2013-12-25 DIAGNOSIS — I471 Supraventricular tachycardia: Secondary | ICD-10-CM

## 2013-12-25 DIAGNOSIS — I498 Other specified cardiac arrhythmias: Secondary | ICD-10-CM | POA: Diagnosis not present

## 2013-12-25 DIAGNOSIS — Z8632 Personal history of gestational diabetes: Secondary | ICD-10-CM | POA: Diagnosis not present

## 2013-12-25 LAB — CBC WITH DIFFERENTIAL/PLATELET
BASOS ABS: 0 10*3/uL (ref 0.0–0.1)
Basophils Relative: 0 % (ref 0–1)
EOS ABS: 0.2 10*3/uL (ref 0.0–0.7)
EOS PCT: 1 % (ref 0–5)
HCT: 44.3 % (ref 36.0–46.0)
Hemoglobin: 14.5 g/dL (ref 12.0–15.0)
Lymphocytes Relative: 14 % (ref 12–46)
Lymphs Abs: 1.9 10*3/uL (ref 0.7–4.0)
MCH: 29.4 pg (ref 26.0–34.0)
MCHC: 32.7 g/dL (ref 30.0–36.0)
MCV: 89.7 fL (ref 78.0–100.0)
Monocytes Absolute: 1 10*3/uL (ref 0.1–1.0)
Monocytes Relative: 7 % (ref 3–12)
Neutro Abs: 10.5 10*3/uL — ABNORMAL HIGH (ref 1.7–7.7)
Neutrophils Relative %: 78 % — ABNORMAL HIGH (ref 43–77)
PLATELETS: 216 10*3/uL (ref 150–400)
RBC: 4.94 MIL/uL (ref 3.87–5.11)
RDW: 14 % (ref 11.5–15.5)
WBC: 13.6 10*3/uL — AB (ref 4.0–10.5)

## 2013-12-25 LAB — BASIC METABOLIC PANEL
ANION GAP: 17 — AB (ref 5–15)
BUN: 7 mg/dL (ref 6–23)
CALCIUM: 8.9 mg/dL (ref 8.4–10.5)
CO2: 22 mEq/L (ref 19–32)
Chloride: 102 mEq/L (ref 96–112)
Creatinine, Ser: 0.85 mg/dL (ref 0.50–1.10)
GFR calc Af Amer: >90 mL/min (ref >90)
Glucose, Bld: 122 mg/dL — ABNORMAL HIGH (ref 70–99)
Potassium: 3.3 mEq/L — ABNORMAL LOW (ref 3.7–5.3)
Sodium: 141 mEq/L (ref 137–147)

## 2013-12-25 LAB — TROPONIN I

## 2013-12-25 MED ORDER — ADENOSINE 6 MG/2ML IV SOLN
INTRAVENOUS | Status: AC
  Filled 2013-12-25: qty 10

## 2013-12-25 NOTE — ED Notes (Signed)
No change in heart rate. 12 mg adenocard pushed rapid IVP.

## 2013-12-25 NOTE — ED Notes (Signed)
6 mg adenocard pushed rapid IVP with EDP verbal at bedside.

## 2013-12-25 NOTE — ED Notes (Signed)
Patient is resting comfortably. 

## 2013-12-25 NOTE — ED Notes (Signed)
PT wanting to sign out AMA; MD aware. RN talked to pt about following up with cardiology.

## 2013-12-25 NOTE — ED Notes (Signed)
Pt unhooking self and standing in room. MD aware.

## 2013-12-25 NOTE — ED Notes (Signed)
Upon entering the room; tech at bedside answering call bell and pt out of bed; refusing to use bedpan. HR in 130's. Pt used bedpan standing up refusing to get back in bed. After emptying bladder; pt got back in bed; HR returned to 90's- 100's.

## 2013-12-25 NOTE — ED Notes (Signed)
PT combative to CRNA when RN walked into room. States we are "going to let her starve to death". RN reassured pt she will not starve and reiterated she is waiting for heart doctor. Pt threatening to leave. MD to be made aware.

## 2013-12-25 NOTE — ED Notes (Signed)
PT reports feeling better; NSR on monitor at this time; EKG repeated.

## 2013-12-25 NOTE — ED Notes (Signed)
This NT went into pts room to do hourly rounding and to see if pt needed to use the restroom and the pt immediately started yelling at this NT. She wanted to know why we had not drawn any blood and I told her that we had and she said that all the tubes were on the bedside table. I explained to her that those were extra IV starters and she continued to yell at me. She asked me why I was in her room and I explained to her that I was in her room to check on her and see if she need to use the  Restroom and I asked her to please stop yelling at me. She asked this NT to go get someone who "knew what the hell was going on". This NT went to go get [facility] and as soon as I walked out she hit the call light. This NT walked back in and explained to her that their are other pts in this pod and Lanora Manis is in another room and I have to go get her. She said that their was no one here when I came in earlier today and there probably still ain't. Ricard Dillon came in at this time

## 2013-12-25 NOTE — ED Notes (Signed)
[facility] number given to her and pt told to follow up with cards.

## 2013-12-25 NOTE — ED Notes (Signed)
Pt denies chest pain

## 2013-12-25 NOTE — ED Notes (Signed)
MD at bedside. 

## 2013-12-25 NOTE — ED Provider Notes (Signed)
CSN: [id number]     Arrival date & time 12/25/13  0808 History   First MD Initiated Contact with Patient 12/25/13 [phone]     Chief Complaint  Patient presents with  . Tachycardia     (Consider location/radiation/quality/duration/timing/severity/associated sxs/prior Treatment) Patient is a 28 y.o. female presenting with palpitations.  Palpitations Palpitations quality:  Fast Onset quality:  Sudden Duration:  1 hour Timing:  Constant Progression:  Unchanged Chronicity:  Recurrent Context comment:  Seen for SVT once last month.  Had some palpitations last night but they went away.  Came back this morning. Relieved by:  Nothing Ineffective treatments:  Valsalva Associated symptoms: chest pain, dizziness, nausea and shortness of breath     Past Medical History  Diagnosis Date  . Tachycardia   . Genital warts   . No pertinent past medical history   . Gestational diabetes     first pregnancy  . Gonorrhea     1st trimester this pregnancy   Past Surgical History  Procedure Laterality Date  . Removal of warts     Family History  Problem Relation Age of Onset  . Diabetes Mother   . Hypertension Mother   . Depression Mother   . Anxiety disorder Mother   . Diabetes Maternal Grandmother   . Hypertension Maternal Grandmother    History  Substance Use Topics  . Smoking status: Current Every Day Smoker -- 0.50 packs/day for 15 years    Types: Cigarettes  . Smokeless tobacco: Never Used  . Alcohol Use: Yes     Comment: occasionally   OB History   Grav Para Term Preterm Abortions TAB SAB Ect Mult Living   0 1 0 1 0 0 3     Review of Systems  Respiratory: Positive for shortness of breath.   Cardiovascular: Positive for chest pain and palpitations.  Gastrointestinal: Positive for nausea.  Neurological: Positive for dizziness.  All other systems reviewed and are negative.     Allergies  Review of patient's allergies indicates no known allergies.  Home  Medications   Prior to Admission medications   Not on File   BP 132/90  Pulse 88  Temp(Src) 97.2 F (36.2 C) (Oral)  Resp 18  SpO2 100% Physical Exam  Nursing note and vitals reviewed. Constitutional: She is oriented to person, place, and time. She appears well-developed and well-nourished. She appears distressed.  HENT:  Head: Normocephalic and atraumatic.  Mouth/Throat: Oropharynx is clear and moist.  Eyes: Conjunctivae are normal. Pupils are equal, round, and reactive to light. No scleral icterus.  Neck: Neck supple.  Cardiovascular: Regular rhythm, normal heart sounds and intact distal pulses.  Tachycardia present.   No murmur heard. Pulmonary/Chest: Effort normal and breath sounds normal. No stridor. No respiratory distress. She has no rales.  Abdominal: Soft. Bowel sounds are normal. She exhibits no distension. There is no tenderness.  Musculoskeletal: Normal range of motion.  Neurological: She is alert and oriented to person, place, and time.  Skin: Skin is warm. No rash noted. She is diaphoretic.  Psychiatric: She has a normal mood and affect. Her behavior is normal.    ED Course  CRITICAL CARE Performed by: Blake Divine DAVID III Authorized by: Blake Divine DAVID III Total critical care time: 35 minutes Critical care time was exclusive of separately billable procedures and treating other patients. Critical care was necessary to treat or prevent imminent or life-threatening deterioration of the following conditions: cardiac failure. Critical care was time spent  personally by me on the following activities: development of treatment plan with patient or surrogate, discussions with consultants, evaluation of patient's response to treatment, examination of patient, obtaining history from patient or surrogate, ordering and performing treatments and interventions, ordering and review of laboratory studies, ordering and review of radiographic studies, pulse oximetry,  re-evaluation of patient's condition and review of old charts.   (including critical care time) Labs Review Labs Reviewed  CBC WITH DIFFERENTIAL - Abnormal; Notable for the following:    WBC 13.6 (*)    Neutrophils Relative % 78 (*)    Neutro Abs 10.5 (*)    All other components within normal limits  BASIC METABOLIC PANEL - Abnormal; Notable for the following:    Potassium 3.3 (*)    Glucose, Bld 122 (*)    Anion gap 17 (*)    All other components within normal limits  TROPONIN I    Imaging Review No results found.   EKG Interpretation   Date/Time:  Thursday December 25 2013 08:24:43 EDT Ventricular Rate:  92 PR Interval:  130 QRS Duration: 83 QT Interval:  339 QTC Calculation: 419 R Axis:   82 Text Interpretation:  Age not entered, assumed to be  28 years old for  purpose of ECG interpretation Sinus rhythm compared to prior, now in sinus  rhythm Confirmed by [facility]  MD, TREY ([id number]) on 12/25/2013 9:21:15 AM      MDM   Final diagnoses:  SVT (supraventricular tachycardia)    28 yo female presenting in SVT.  She failed valsalva and carotid massage.  She failed adenosine .  She converted after adenosine .    Pt decided to leave against medical advice prior to cardiology consultation.  She understood the risks  and benefits of leaving and was competent to make her own medical decisions.  Nursing andI strongly recommended close outpatient cardiology followup.  She stated that she "hadn't gotten around to it" when asked why she hadn't followed up after her last episode.  Subsequent to her departure, I was able to discussed her case with Dr. Katrinka Blazing (Cardiology) who will try to arrange for outpatient follow up.        Candyce Churn III, MD 12/25/13 [phone]

## 2013-12-25 NOTE — ED Notes (Signed)
Heart racing; pt tried to vagal at home.

## 2013-12-25 NOTE — ED Notes (Signed)
PT offered [place] sandwich to hold her over until lunch could be ordered; pt refused. States "she wants to go home since we can't tell her when doctor going to call" MD aware.

## 2014-02-18 ENCOUNTER — Encounter: Payer: Self-pay | Admitting: Cardiology

## 2014-02-18 ENCOUNTER — Ambulatory Visit (INDEPENDENT_AMBULATORY_CARE_PROVIDER_SITE_OTHER): Payer: Medicaid Other | Admitting: Cardiology

## 2014-02-18 VITALS — BP 118/66 | HR 80 | Ht 69.0 in | Wt 251.4 lb

## 2014-02-18 DIAGNOSIS — I471 Supraventricular tachycardia: Secondary | ICD-10-CM

## 2014-02-18 MED ORDER — ATENOLOL 25 MG PO TABS: 25.0000 mg | ORAL_TABLET | Freq: Every day | ORAL | Status: DC

## 2014-02-18 NOTE — Patient Instructions (Signed)
Start Atenolol 25 mg daily  Stop smoking  Stop drinking caffeine.  We will check some blood work.  I will see you in 3 months.

## 2014-02-18 NOTE — Progress Notes (Signed)
Dorothy Meadows Date of Birth: Jan 02, 1986 Medical Record #[id number]  History of Present Illness: Dorothy Meadows is a 28 yo BF referred by Dr. Loretha StaplerWofford in the EDP for evaluation of SVT. She reports a history of tachycardia dating back 8 yrs. Her initial episode was treated in the ED and she was started on a medication which she later stopped. She has intermittent tachycardia since then that she is usually able to stop with Valsalva maneuvers. Sometimes it will last up to 2 hours. It makes her feel uncomfortable but no history of syncope or near syncope. No tachycardia during prior pregnancies. Over the past 4 months she has noted increased episodes. She presented to the ED on 11/22/13 but converted in the ambulance. On 12/23/13 she went to the ED with SVT rate 209 bpm. This was terminated with IV Adenosine. She drinks 3 glasses of tea a day. She smokes 0.5 packs/day. No cocaine use.    No prior meds. Has hormone implant in place.  No Known Allergies  Past Medical History  Diagnosis Date  . SVT (supraventricular tachycardia)   . Genital warts   . No pertinent past medical history   . Gestational diabetes     first pregnancy  . Gonorrhea     1st trimester this pregnancy    Past Surgical History  Procedure Laterality Date  . Removal of warts      History   Social History  . Marital Status: Single    Spouse Name: N/A    Number of Children: 3  . Years of Education: N/A   Social History Main Topics  . Smoking status: Current Every Day Smoker -- 0.50 packs/day for 15 years    Types: Cigarettes  . Smokeless tobacco: Never Used  . Alcohol Use: Yes     Comment: occasionally  . Drug Use: Yes    Special: Marijuana     Comment: last use July 12th 2014  . Sexual Activity: Not Currently    Birth Control/ Protection: None   Other Topics Concern  . None   Social History Narrative  . None    Family History  Problem Relation Age of Onset  . Diabetes Mother   . Hypertension Mother    . Depression Mother   . Anxiety disorder Mother   . Diabetes Maternal Grandmother   . Hypertension Maternal Grandmother     Review of Systems: As noted in HPI.  All other systems were reviewed and are negative.  Physical Exam: BP 118/66  Pulse 80  Ht 5\' 9"  (1.753 m)  Wt 251 lb 6.4 oz (114.034 kg)  BMI 37.11 kg/m2  LMP 06/02/2013 Filed Weights   02/18/14 1434  Weight: 251 lb 6.4 oz (114.034 kg)  GENERAL:  Well appearing, obese BF in NAD HEENT:  PERRL, EOMI, sclera are clear. Oropharynx is clear. NECK:  No jugular venous distention, carotid upstroke brisk and symmetric, no bruits, no thyromegaly or adenopathy LUNGS:  Clear to auscultation bilaterally CHEST:  Unremarkable HEART:  RRR,  PMI not displaced or sustained,S1 and S2 within normal limits, no S3, no S4: no clicks, no rubs, no murmurs ABD:  Soft, nontender. BS +, no masses or bruits. No hepatomegaly, no splenomegaly EXT:  2 + pulses throughout, no edema, no cyanosis no clubbing SKIN:  Warm and dry.  No rashes NEURO:  Alert and oriented x 3. Cranial nerves II through XII intact. PSYCH:  Cognitively intact    LABORATORY DATA: Ecg NSR with normal Ecg. No  pre-excitation.  Ecg in SVT: SVT with rate 209. Narrow complex. No P waves seen.  Assessment / Plan: 1. 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 Recommend avoidance of stimulants including tobacco and caffeine. Will check TSH. Reviewed Valsalva maneuvers. Will start Atenolol 25 mg daily. I will follow up in 3 months. If she has breakthrough despite medical therapy could consider ablation.

## 2014-02-19 LAB — TSH: TSH: 0.012 u[IU]/mL — ABNORMAL LOW (ref 0.350–4.500)

## 2014-03-02 ENCOUNTER — Encounter: Payer: Self-pay | Admitting: Cardiology

## 2014-05-12 IMAGING — US US OB COMP LESS 14 WK
1 series · 14 of 28 positions shown · non-contrast
Comparison: none

CLINICAL DATA: First trimester pregnancy.  Abdominal pain.

OBSTETRIC <14 WK US AND TRANSVAGINAL OB US
TECHNIQUE: Both transabdominal and transvaginal ultrasound
examinations were performed for complete evaluation of the
gestation as well as the maternal uterus, adnexal regions, and
pelvic cul-de-sac.

[Series 1: us ob comp less 14 wks · 14 of 38 slices shown]
[im 2/38]
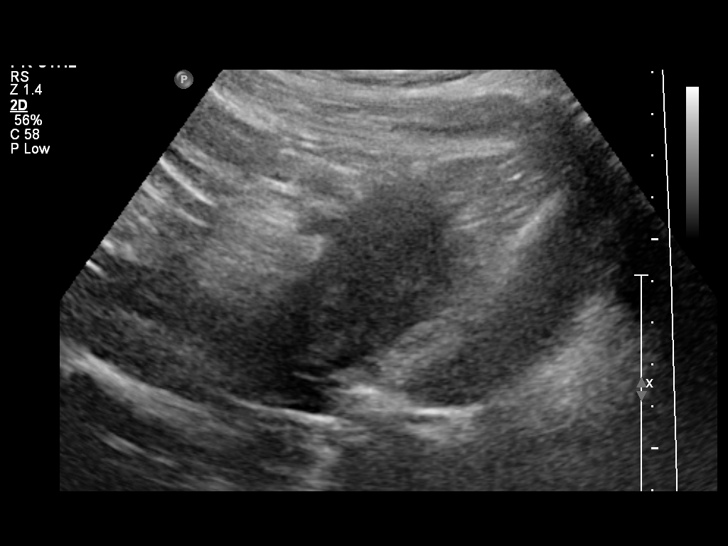
[im 5/38]
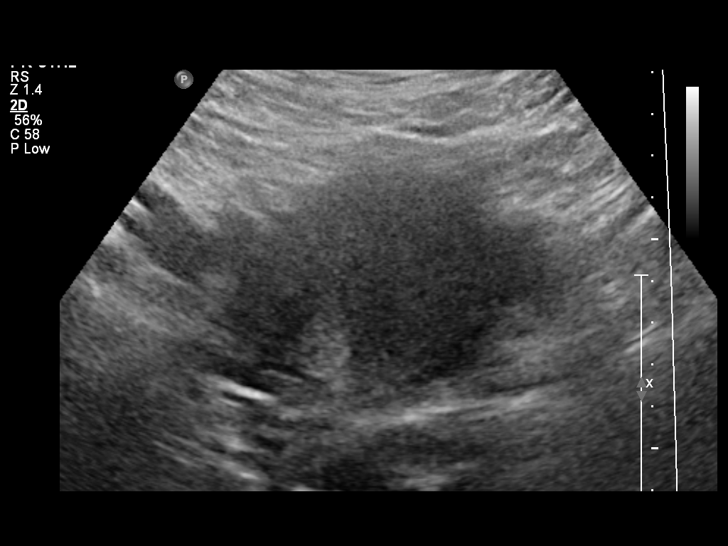
[im 7/38]
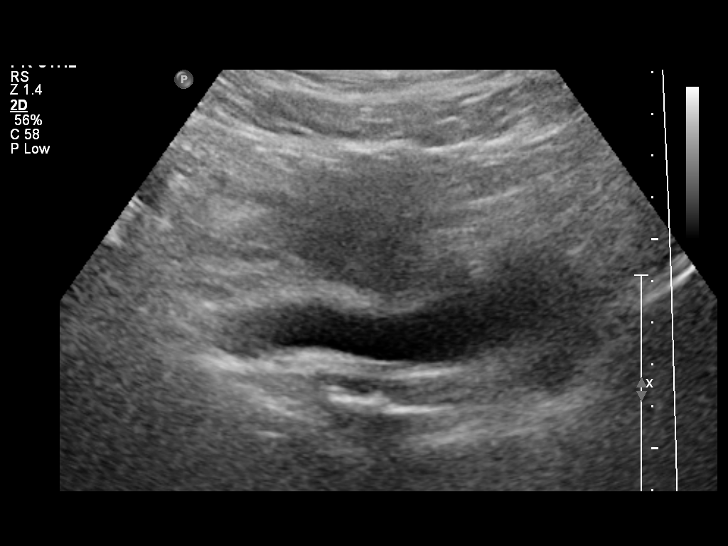
[im 10/38]
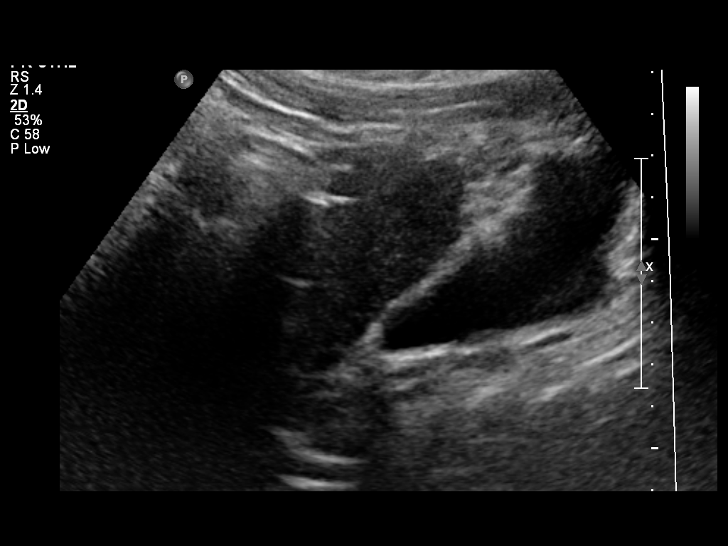
[im 13/38]
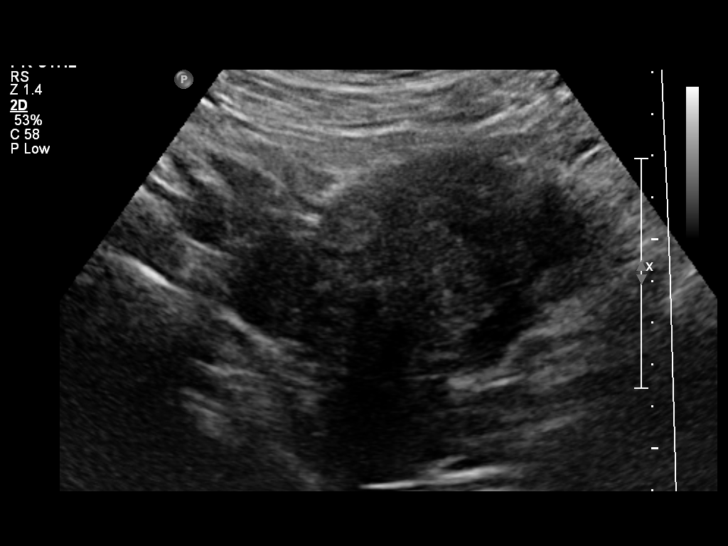
[im 16/38]
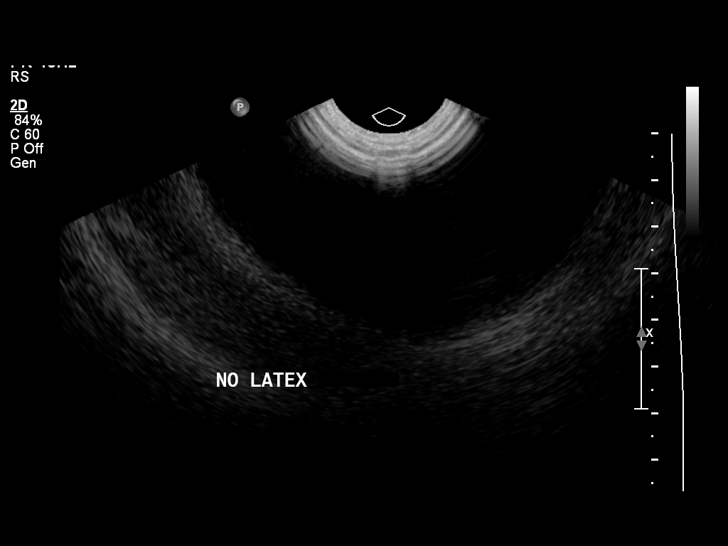
[im 18/38]
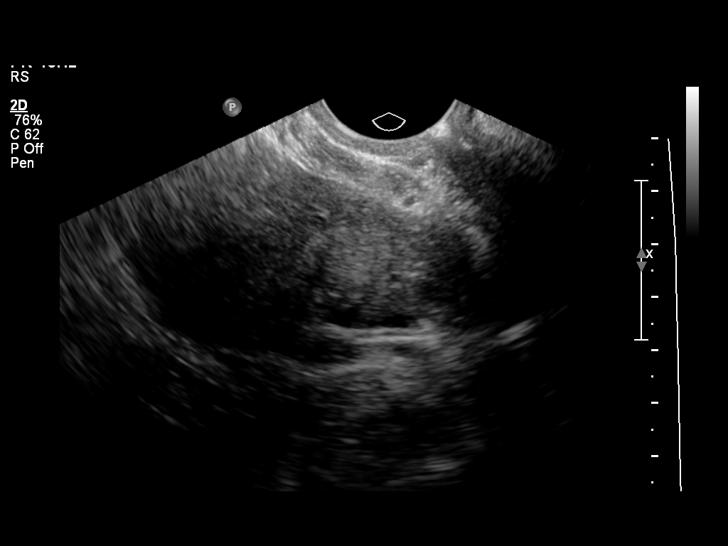
[im 21/38]
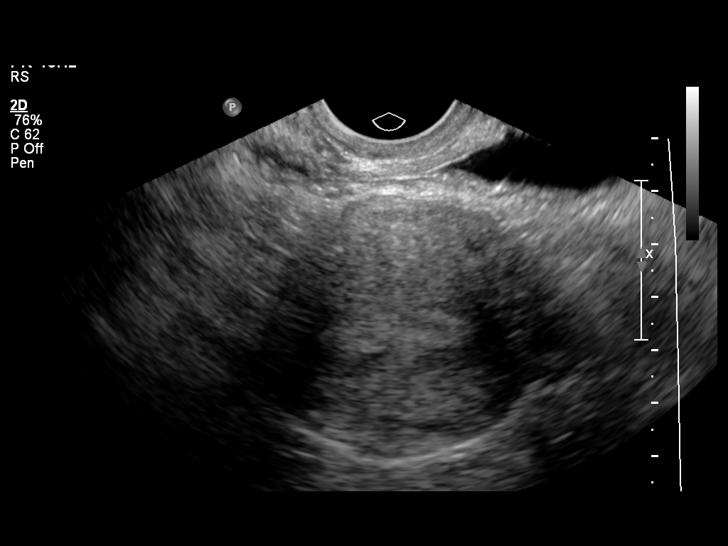
[im 24/38]
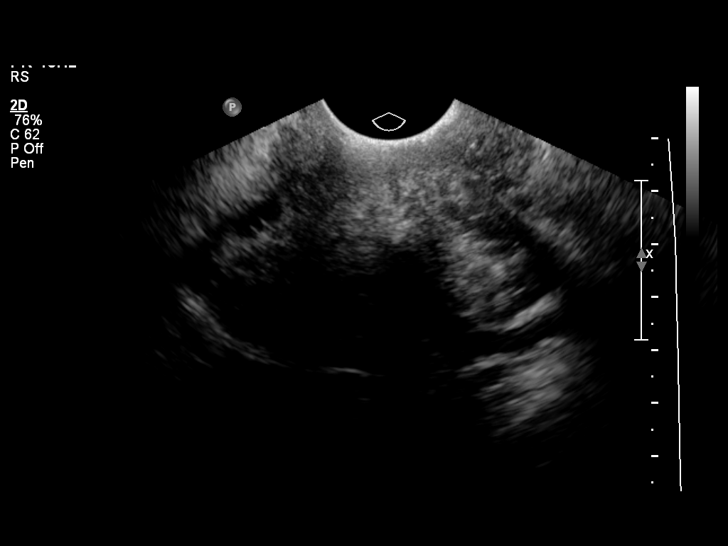
[im 27/38]
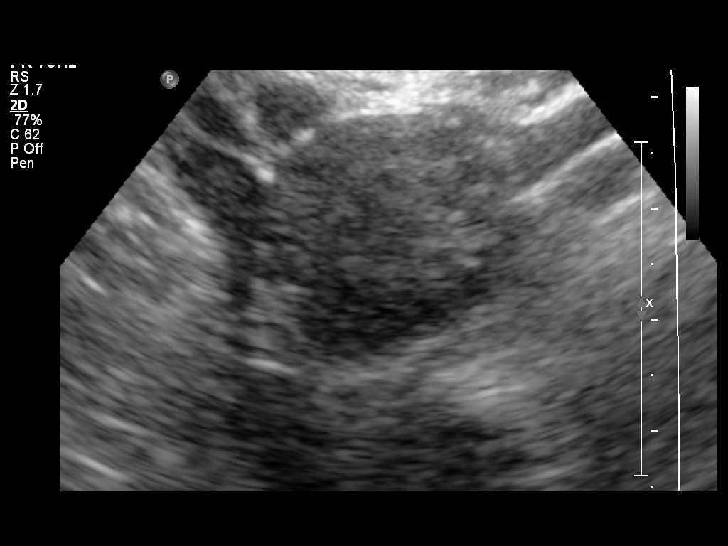
[im 29/38]
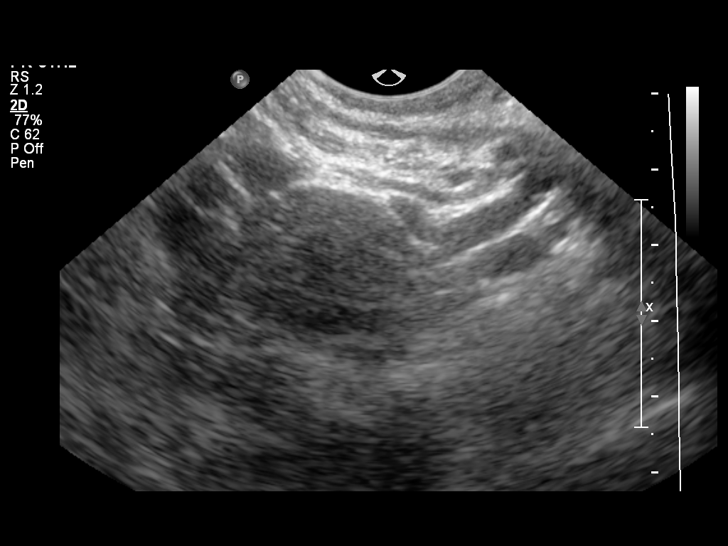
[im 32/38]
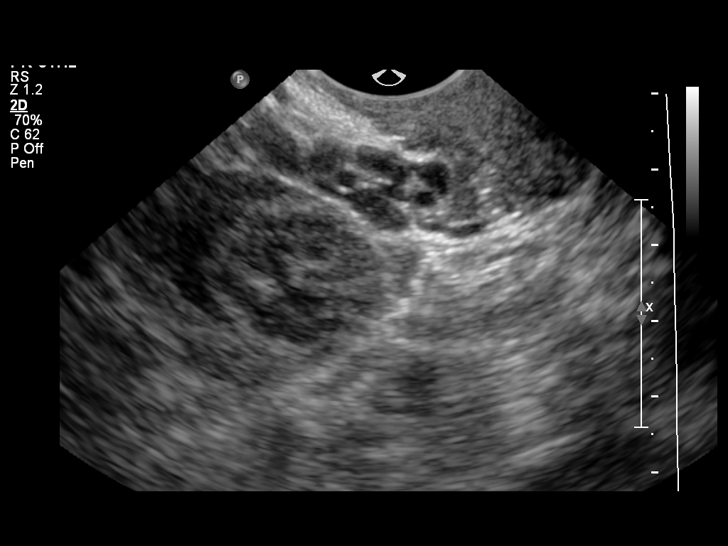
[im 35/38]
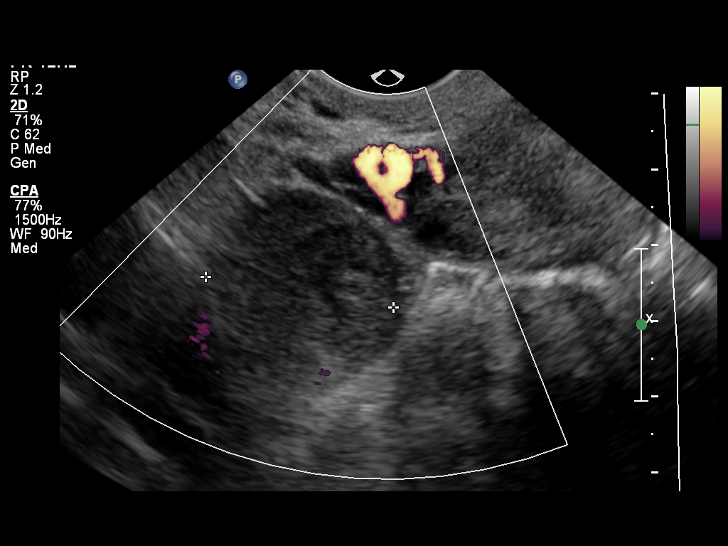
[im 38/38]
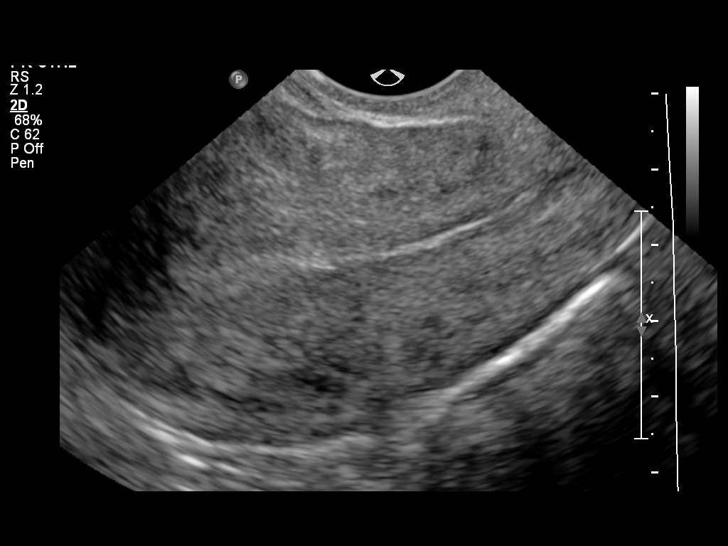

[14 of 28 positions shown; findings below may reference images not displayed]

FINDINGS: Urine pregnancy test was positive.

Transabdominal and transvaginal pelvic ultrasound demonstrate
endometrial stripe thickness of up to 6 mm, without intrauterine
gestational sac observed.  Uterine length 9.4 cm.  No myometrial
mass observed.

The left ovary measures 2.0 x 2.4 x 2.1 cm and appears normal.

The right ovary measures 3.4 x 2.9 x 2.5 cm and contains a 1.8 cm
corpus luteum.

No discrete adnexal mass identified.  No free pelvic fluid is
observed currently.
IMPRESSION: 1.  Currently no intrauterine pregnancy is visualized, and no
adnexal mass or free pelvic fluid is seen.  Differential diagnostic
considerations include very early pregnancy, occult ectopic
pregnancy, or spontaneous abortion.  Correlation with quantitative
beta HCG trend and a low threshold for ultrasound reimaging is
recommended.

## 2014-05-21 ENCOUNTER — Ambulatory Visit: Payer: Medicaid Other | Admitting: Cardiology

## 2014-05-28 ENCOUNTER — Encounter: Payer: Self-pay | Admitting: Cardiology

## 2014-07-02 ENCOUNTER — Encounter (HOSPITAL_COMMUNITY): Payer: Self-pay | Admitting: Emergency Medicine

## 2014-07-02 ENCOUNTER — Emergency Department (HOSPITAL_COMMUNITY)
Admission: EM | Admit: 2014-07-02 | Discharge: 2014-07-03 | Disposition: A | Discharge status: Home / Self Care | Payer: Medicaid Other | Attending: Emergency Medicine | Admitting: Emergency Medicine

## 2014-07-02 DIAGNOSIS — Z79899 Other long term (current) drug therapy: Secondary | ICD-10-CM | POA: Insufficient documentation

## 2014-07-02 DIAGNOSIS — K0889 Other specified disorders of teeth and supporting structures: Secondary | ICD-10-CM

## 2014-07-02 DIAGNOSIS — K029 Dental caries, unspecified: Secondary | ICD-10-CM | POA: Insufficient documentation

## 2014-07-02 DIAGNOSIS — I471 Supraventricular tachycardia: Secondary | ICD-10-CM | POA: Diagnosis not present

## 2014-07-02 DIAGNOSIS — K088 Other specified disorders of teeth and supporting structures: Secondary | ICD-10-CM | POA: Diagnosis present

## 2014-07-02 DIAGNOSIS — Z8619 Personal history of other infectious and parasitic diseases: Secondary | ICD-10-CM | POA: Insufficient documentation

## 2014-07-02 DIAGNOSIS — K002 Abnormalities of size and form of teeth: Secondary | ICD-10-CM | POA: Diagnosis not present

## 2014-07-02 DIAGNOSIS — Z720 Tobacco use: Secondary | ICD-10-CM | POA: Diagnosis not present

## 2014-07-02 DIAGNOSIS — Z8632 Personal history of gestational diabetes: Secondary | ICD-10-CM | POA: Diagnosis not present

## 2014-07-02 MED ORDER — OXYCODONE-ACETAMINOPHEN 5-325 MG PO TABS: 1.0000 | ORAL_TABLET | ORAL | Status: DC | PRN

## 2014-07-02 MED ORDER — AMOXICILLIN 500 MG PO CAPS: 500.0000 mg | ORAL_CAPSULE | Freq: Three times a day (TID) | ORAL | Status: DC

## 2014-07-02 MED ORDER — LIDOCAINE VISCOUS 2 % MT SOLN
15.0000 mL | Freq: Once | OROMUCOSAL | Status: AC
  Administered 2014-07-03: 15 mL via OROMUCOSAL
  Filled 2014-07-02: qty 15

## 2014-07-02 NOTE — ED Provider Notes (Signed)
CSN: [id number]     Arrival date & time 07/02/14  2328 History   First MD Initiated Contact with Patient 07/02/14 2338     Chief Complaint  Patient presents with  . Dental Pain     (Consider location/radiation/quality/duration/timing/severity/associated sxs/prior Treatment) HPI Comments: Patient is a 29 yo F presenting to the ED for right upper dental pain that began today. Patient states she had a filling placed two months ago and believes her filling may have fallen out. Alleviating factors: none. Aggravating factors: eating, drinking. Medications tried prior to arrival: Ibuprofen, Tylenol, Orajel. Patient does have a dentist. Denies any fevers, drainage, SOB.      Past Medical History  Diagnosis Date  . SVT (supraventricular tachycardia)   . Genital warts   . No pertinent past medical history   . Gestational diabetes     first pregnancy  . Gonorrhea     1st trimester this pregnancy   Past Surgical History  Procedure Laterality Date  . Removal of warts     Family History  Problem Relation Age of Onset  . Diabetes Mother   . Hypertension Mother   . Depression Mother   . Anxiety disorder Mother   . Diabetes Maternal Grandmother   . Hypertension Maternal Grandmother    History  Substance Use Topics  . Smoking status: Current Every Day Smoker -- 0.50 packs/day for 15 years    Types: Cigarettes  . Smokeless tobacco: Never Used  . Alcohol Use: Yes     Comment: occasionally   OB History    Gravida Para Term Preterm AB TAB SAB Ectopic Multiple Living   4 3 3  0 1 0 1 0 0 3     Review of Systems  Constitutional: Negative for fever and chills.  HENT: Positive for dental problem.   Gastrointestinal: Negative for nausea and vomiting.  All other systems reviewed and are negative.     Allergies  Review of patient's allergies indicates no known allergies.  Home Medications   Prior to Admission medications   Medication Sig Start Date End Date Taking? Authorizing  Provider  amoxicillin (AMOXIL) 500 MG capsule Take 1 capsule (500 mg total) by mouth 3 (three) times daily. 07/02/14   Baldemar Dady L Hadlei Stitt, PA-C  atenolol (TENORMIN) 25 MG tablet Take 1 tablet (25 mg total) by mouth daily. 02/18/14   Peter M SwazilandJordan, MD  oxyCODONE-acetaminophen (PERCOCET/ROXICET) 5-325 MG per tablet Take 1 tablet by mouth every 4 (four) hours as needed for severe pain. May take 2 tablets PO q 6 hours for severe pain - Do not take with Tylenol as this tablet already contains tylenol 07/02/14   Escher Harr L Rikayla Demmon, PA-C   BP 138/76 mmHg  Pulse 65  Temp(Src) 97.9 F (36.6 C) (Oral)  Resp 20  Ht 5\' 9"  (1.753 m)  Wt 261 lb 9.6 oz (118.661 kg)  BMI 38.61 kg/m2  SpO2 100%  LMP 07/01/2014 Physical Exam  Constitutional: She is oriented to person, place, and time. She appears well-developed and well-nourished. No distress.  HENT:  Head: Normocephalic and atraumatic.  Right Ear: External ear normal.  Left Ear: External ear normal.  Nose: Nose normal.  Mouth/Throat: Uvula is midline, oropharynx is clear and moist and mucous membranes are normal. No oral lesions. No trismus in the jaw. Abnormal dentition. Dental caries present. No dental abscesses or uvula swelling. No oropharyngeal exudate.    Submental and sublingual spaces are soft.  Eyes: Conjunctivae are normal.  Neck: Normal range of  motion. Neck supple.  Cardiovascular: Normal rate.   Pulmonary/Chest: Effort normal and breath sounds normal. No respiratory distress.  Lymphadenopathy:    She has no cervical adenopathy.  Neurological: She is alert and oriented to person, place, and time.  Skin: Skin is warm and dry. She is not diaphoretic.  Psychiatric: She has a normal mood and affect.  Nursing note and vitals reviewed.   ED Course  Procedures (including critical care time) Medications  lidocaine (XYLOCAINE) 2 % viscous mouth solution 15 mL (15 mLs Mouth/Throat Given 07/03/14 0001)    Labs Review Labs Reviewed -  No data to display  Imaging Review No results found.   EKG Interpretation None      MDM   Final diagnoses:  Pain, dental    Filed Vitals:   07/02/14 2332  BP: 138/76  Pulse: 65  Temp: 97.9 F (36.6 C)  Resp: 20   Afebrile, NAD, non-toxic appearing, AAOx4.  Patient with toothache.  No gross abscess.  Exam unconcerning for Ludwig's angina or spread of infection.  Will treat with Amoxil and pain medicine.  Urged patient to follow-up with dentist.  Patient is stable at time of discharge.       Jeannetta Ellis, PA-C 07/03/14 0019  Juliet Rude. Rubin Payor, MD 07/03/14 [phone]

## 2014-07-02 NOTE — ED Notes (Signed)
Pt presents with dental pain to right upper mouth onset today.  Pt had recent filling placed 2 months ago.

## 2014-07-02 NOTE — Discharge Instructions (Signed)
Please follow up with your primary care physician in 1-2 days. If you do not have one please call the [facility] number listed above. Please follow up with your dentist in the morning to schedule a follow up appointment.  Please take pain medication and/or muscle relaxants as prescribed and as needed for pain. Please do not drive on narcotic pain medication or on muscle relaxants. Please take your antibiotic until completion. Please read all discharge instructions and return precautions.    Dental Pain A tooth ache may be caused by cavities (tooth decay). Cavities expose the nerve of the tooth to air and hot or cold temperatures. It may come from an infection or abscess (also called a boil or furuncle) around your tooth. It is also often caused by dental caries (tooth decay). This causes the pain you are having. DIAGNOSIS  Your caregiver can diagnose this problem by exam. TREATMENT   If caused by an infection, it may be treated with medications which kill germs (antibiotics) and pain medications as prescribed by your caregiver. Take medications as directed.  Only take over-the-counter or prescription medicines for pain, discomfort, or fever as directed by your caregiver.  Whether the tooth ache today is caused by infection or dental disease, you should see your dentist as soon as possible for further care. SEEK MEDICAL CARE IF: The exam and treatment you received today has been provided on an emergency basis only. This is not a substitute for complete medical or dental care. If your problem worsens or new problems (symptoms) appear, and you are unable to meet with your dentist, call or return to this location. SEEK IMMEDIATE MEDICAL CARE IF:   You have a fever.  You develop redness and swelling of your face, jaw, or neck.  You are unable to open your mouth.  You have severe pain uncontrolled by pain medicine. MAKE SURE YOU:   Understand these instructions.  Will  watch your condition.  Will get help right away if you are not doing well or get worse. Document Released: 04/17/2005 Document Revised: 07/10/2011 Document Reviewed: 12/04/2007 [facility] Patient Information 2015 [place], [place]. This information is not intended to replace advice given to you by your health care provider. Make sure you discuss any questions you have with your health care provider.

## 2014-09-08 ENCOUNTER — Encounter (HOSPITAL_COMMUNITY): Payer: Self-pay | Admitting: *Deleted

## 2014-09-08 DIAGNOSIS — Z79899 Other long term (current) drug therapy: Secondary | ICD-10-CM | POA: Diagnosis not present

## 2014-09-08 DIAGNOSIS — R600 Localized edema: Secondary | ICD-10-CM | POA: Diagnosis not present

## 2014-09-08 DIAGNOSIS — Z8619 Personal history of other infectious and parasitic diseases: Secondary | ICD-10-CM | POA: Diagnosis not present

## 2014-09-08 DIAGNOSIS — Z720 Tobacco use: Secondary | ICD-10-CM | POA: Diagnosis not present

## 2014-09-08 DIAGNOSIS — R2243 Localized swelling, mass and lump, lower limb, bilateral: Secondary | ICD-10-CM | POA: Diagnosis present

## 2014-09-08 DIAGNOSIS — Z8632 Personal history of gestational diabetes: Secondary | ICD-10-CM | POA: Diagnosis not present

## 2014-09-08 LAB — COMPREHENSIVE METABOLIC PANEL
ALK PHOS: 53 U/L (ref 38–126)
ALT: 15 U/L (ref 14–54)
AST: 19 U/L (ref 15–41)
Albumin: 3 g/dL — ABNORMAL LOW (ref 3.5–5.0)
Anion gap: 7 (ref 5–15)
BUN: 6 mg/dL (ref 6–20)
CALCIUM: 8.5 mg/dL — AB (ref 8.9–10.3)
CO2: 24 mmol/L (ref 22–32)
Chloride: 108 mmol/L (ref 101–111)
Creatinine, Ser: 1.03 mg/dL — ABNORMAL HIGH (ref 0.44–1.00)
GFR calc non Af Amer: >60 mL/min (ref >60)
GLUCOSE: 86 mg/dL (ref 70–99)
POTASSIUM: 3.9 mmol/L (ref 3.5–5.1)
Sodium: 139 mmol/L (ref 135–145)
Total Bilirubin: 0.3 mg/dL (ref 0.3–1.2)
Total Protein: 5.7 g/dL — ABNORMAL LOW (ref 6.5–8.1)

## 2014-09-08 LAB — CBC WITH DIFFERENTIAL/PLATELET
Basophils Absolute: 0 10*3/uL (ref 0.0–0.1)
Basophils Relative: 0 % (ref 0–1)
EOS PCT: 4 % (ref 0–5)
Eosinophils Absolute: 0.5 10*3/uL (ref 0.0–0.7)
HEMATOCRIT: 39.1 % (ref 36.0–46.0)
Hemoglobin: 12.6 g/dL (ref 12.0–15.0)
LYMPHS PCT: 27 % (ref 12–46)
Lymphs Abs: 3.6 10*3/uL (ref 0.7–4.0)
MCH: 28.8 pg (ref 26.0–34.0)
MCHC: 32.2 g/dL (ref 30.0–36.0)
MCV: 89.3 fL (ref 78.0–100.0)
MONO ABS: 0.9 10*3/uL (ref 0.1–1.0)
Monocytes Relative: 6 % (ref 3–12)
Neutro Abs: 8.3 10*3/uL — ABNORMAL HIGH (ref 1.7–7.7)
Neutrophils Relative %: 63 % (ref 43–77)
PLATELETS: 208 10*3/uL (ref 150–400)
RBC: 4.38 MIL/uL (ref 3.87–5.11)
RDW: 13.9 % (ref 11.5–15.5)
WBC: 13.3 10*3/uL — AB (ref 4.0–10.5)

## 2014-09-08 LAB — BRAIN NATRIURETIC PEPTIDE: B NATRIURETIC PEPTIDE 5: 57.6 pg/mL (ref 0.0–100.0)

## 2014-09-08 NOTE — ED Notes (Signed)
Pt in c/o bilateral lower legs and feet, also generalized fatigue recently, no history of same, no distress noted

## 2014-09-09 ENCOUNTER — Emergency Department (HOSPITAL_COMMUNITY)
Admission: EM | Admit: 2014-09-09 | Discharge: 2014-09-09 | Disposition: A | Discharge status: Home / Self Care | Payer: Medicaid Other | Attending: Emergency Medicine | Admitting: Emergency Medicine

## 2014-09-09 DIAGNOSIS — R609 Edema, unspecified: Secondary | ICD-10-CM

## 2014-09-09 NOTE — Discharge Instructions (Signed)
Cut back on salt in your diet.  Wear compression stockings to help with swelling.   Peripheral Edema You have swelling in your legs (peripheral edema). This swelling is due to excess accumulation of salt and water in your body. Edema may be a sign of heart, kidney or liver disease, or a side effect of a medication. It may also be due to problems in the leg veins. Elevating your legs and using special support stockings may be very helpful, if the cause of the swelling is due to poor venous circulation. Avoid long periods of standing, whatever the cause. Treatment of edema depends on identifying the cause. Chips, pretzels, pickles and other salty foods should be avoided. Restricting salt in your diet is almost always needed. Water pills (diuretics) are often used to remove the excess salt and water from your body via urine. These medicines prevent the kidney from reabsorbing sodium. This increases urine flow. Diuretic treatment may also result in lowering of potassium levels in your body. Potassium supplements may be needed if you have to use diuretics daily. Daily weights can help you keep track of your progress in clearing your edema. You should call your caregiver for follow up care as recommended. SEEK IMMEDIATE MEDICAL CARE IF:   You have increased swelling, pain, redness, or heat in your legs.  You develop shortness of breath, especially when lying down.  You develop chest or abdominal pain, weakness, or fainting.  You have a fever. Document Released: 05/25/2004 Document Revised: 07/10/2011 Document Reviewed: 05/05/2009 [facility] Patient Information 2015 [place], [place]. This information is not intended to replace advice given to you by your health care provider. Make sure you discuss any questions you have with your health care provider.

## 2014-09-09 NOTE — ED Provider Notes (Signed)
CSN: [id number]     Arrival date & time 09/08/14  2134 History  This chart was scribed for Dorothy Severinlga Temiloluwa Laredo, MD by Phillis HaggisGabriella Gaje, ED Scribe. This patient was seen in room A04C/A04C and patient care was started at 3:09 AM.    Chief Complaint  Patient presents with  . Leg Swelling   The history is provided by the patient. No language interpreter was used.    HPI Comments: Dorothy Meadows is a 29 y.o. female with a history of SVT and thyroid problems who presents to the Emergency Department complaining of bilateral lower leg swelling onset 3 days ago. Patient states that she has attempted to elevate her legs but the swelling only slightly ceases in the morning. She states that she has been unable to get an appointment with her PCP. She states that she is currently taking atenolol for the SVT. She states that she has 3 children so it is hard to sit around and elevate her legs. Patient denies similar symptoms. Patient states that she has Medicare.   Past Medical History  Diagnosis Date  . SVT (supraventricular tachycardia)   . Genital warts   . No pertinent past medical history   . Gestational diabetes     first pregnancy  . Gonorrhea     1st trimester this pregnancy   Past Surgical History  Procedure Laterality Date  . Removal of warts     Family History  Problem Relation Age of Onset  . Diabetes Mother   . Hypertension Mother   . Depression Mother   . Anxiety disorder Mother   . Diabetes Maternal Grandmother   . Hypertension Maternal Grandmother    History  Substance Use Topics  . Smoking status: Current Every Day Smoker -- 0.50 packs/day for 15 years    Types: Cigarettes  . Smokeless tobacco: Never Used  . Alcohol Use: Yes     Comment: occasionally   OB History    Gravida Para Term Preterm AB TAB SAB Ectopic Multiple Living   4 3 3  0 1 0 1 0 0 3     Review of Systems  Cardiovascular: Positive for leg swelling.  All other systems reviewed and are negative.  Allergies   Review of patient's allergies indicates no known allergies.  Home Medications   Prior to Admission medications   Medication Sig Start Date End Date Taking? Authorizing Provider  atenolol (TENORMIN) 25 MG tablet Take 1 tablet (25 mg total) by mouth daily. 02/18/14  Yes Peter M SwazilandJordan, MD  omeprazole (PRILOSEC) 20 MG capsule Take 20 mg by mouth daily as needed. 07/29/14  Yes Historical Provider, MD  amoxicillin (AMOXIL) 500 MG capsule Take 1 capsule (500 mg total) by mouth 3 (three) times daily. Patient not taking: Reported on 09/08/2014 07/02/14   Francee PiccoloJennifer Piepenbrink, PA-C  oxyCODONE-acetaminophen (PERCOCET/ROXICET) 5-325 MG per tablet Take 1 tablet by mouth every 4 (four) hours as needed for severe pain. May take 2 tablets PO q 6 hours for severe pain - Do not take with Tylenol as this tablet already contains tylenol Patient not taking: Reported on 09/08/2014 07/02/14   Victorino DikeJennifer Piepenbrink, PA-C   BP 130/72 mmHg  Pulse 84  Temp(Src) 98.3 F (36.8 C) (Oral)  Resp 19  Ht 5\' 9"  (1.753 m)  Wt 262 lb (118.842 kg)  BMI 38.67 kg/m2  SpO2 100% Physical Exam  Constitutional: She is oriented to person, place, and time. She appears well-developed and well-nourished.  HENT:  Head: Normocephalic and atraumatic.  Nose: Nose normal.  Mouth/Throat: Oropharynx is clear and moist.  Eyes: Conjunctivae and EOM are normal. Pupils are equal, round, and reactive to light.  Neck: Normal range of motion. Neck supple. No JVD present. No tracheal deviation present. No thyromegaly present.  Cardiovascular: Normal rate, regular rhythm, normal heart sounds and intact distal pulses.  Exam reveals no gallop and no friction rub.   No murmur heard. Pulmonary/Chest: Effort normal and breath sounds normal. No stridor. No respiratory distress. She has no wheezes. She has no rales. She exhibits no tenderness.  Abdominal: Soft. Bowel sounds are normal. She exhibits no distension and no mass. There is no tenderness. There is  no rebound and no guarding.  Musculoskeletal: Normal range of motion. She exhibits edema (patient has 1+ edema in ankles). She exhibits no tenderness.  Lymphadenopathy:    She has no cervical adenopathy.  Neurological: She is alert and oriented to person, place, and time. She displays normal reflexes. She exhibits normal muscle tone. Coordination normal.  Skin: Skin is warm and dry. No rash noted. No erythema. No pallor.  Psychiatric: She has a normal mood and affect. Her behavior is normal. Judgment and thought content normal.  Nursing note and vitals reviewed.   ED Course  Procedures (including critical care time) DIAGNOSTIC STUDIES: Oxygen Saturation is 100% on room air, normal by my interpretation.    COORDINATION OF CARE: 3:12 AM-Discussed treatment plan which includes diet changes and compression stockings with pt at bedside and pt agreed to plan.   Labs Review Labs Reviewed  CBC WITH DIFFERENTIAL/PLATELET - Abnormal; Notable for the following:    WBC 13.3 (*)    Neutro Abs 8.3 (*)    All other components within normal limits  COMPREHENSIVE METABOLIC PANEL - Abnormal; Notable for the following:    Creatinine, Ser 1.03 (*)    Calcium 8.5 (*)    Total Protein 5.7 (*)    Albumin 3.0 (*)    All other components within normal limits  BRAIN NATRIURETIC PEPTIDE   Imaging Review No results found.   EKG Interpretation None      MDM   Final diagnoses:  Peripheral edema    29 year old female with 3 days of lower extremity edema.  No respiratory symptoms.  No signs of fluid overload or heart failure.  Instructed patient to limit salt intake, elevate feet when possible, and use compression stockings.  I personally performed the services described in this documentation, which was scribed in my presence. The recorded information has been reviewed and is accurate.      Dorothy Severinlga Quatavious Rossa, MD 09/09/14 [phone]

## 2014-09-09 NOTE — ED Notes (Signed)
Pt stable, ambulatory, states understanding of discharge instructions 

## 2015-01-09 ENCOUNTER — Emergency Department (HOSPITAL_COMMUNITY)
Admission: EM | Admit: 2015-01-09 | Discharge: 2015-01-10 | Disposition: A | Discharge status: Home / Self Care | Payer: Medicaid Other | Attending: Emergency Medicine | Admitting: Emergency Medicine

## 2015-01-09 DIAGNOSIS — R0602 Shortness of breath: Secondary | ICD-10-CM | POA: Diagnosis not present

## 2015-01-09 DIAGNOSIS — Z79899 Other long term (current) drug therapy: Secondary | ICD-10-CM | POA: Insufficient documentation

## 2015-01-09 DIAGNOSIS — Z8632 Personal history of gestational diabetes: Secondary | ICD-10-CM | POA: Diagnosis not present

## 2015-01-09 DIAGNOSIS — Z720 Tobacco use: Secondary | ICD-10-CM | POA: Insufficient documentation

## 2015-01-09 DIAGNOSIS — M545 Low back pain: Secondary | ICD-10-CM | POA: Diagnosis present

## 2015-01-09 DIAGNOSIS — M5431 Sciatica, right side: Secondary | ICD-10-CM | POA: Diagnosis not present

## 2015-01-09 DIAGNOSIS — Z8619 Personal history of other infectious and parasitic diseases: Secondary | ICD-10-CM | POA: Diagnosis not present

## 2015-01-09 DIAGNOSIS — Z8679 Personal history of other diseases of the circulatory system: Secondary | ICD-10-CM | POA: Diagnosis not present

## 2015-01-10 ENCOUNTER — Encounter (HOSPITAL_COMMUNITY): Payer: Self-pay | Admitting: *Deleted

## 2015-01-10 MED ORDER — CYCLOBENZAPRINE HCL 10 MG PO TABS
5.0000 mg | ORAL_TABLET | Freq: Once | ORAL | Status: AC
  Administered 2015-01-10: 5 mg via ORAL
  Filled 2015-01-10: qty 1

## 2015-01-10 MED ORDER — PREDNISONE 20 MG PO TABS
60.0000 mg | ORAL_TABLET | Freq: Once | ORAL | Status: AC
  Administered 2015-01-10: 60 mg via ORAL
  Filled 2015-01-10: qty 3

## 2015-01-10 MED ORDER — PREDNISONE 20 MG PO TABS: ORAL_TABLET | ORAL | Status: DC

## 2015-01-10 MED ORDER — FENTANYL CITRATE (PF) 100 MCG/2ML IJ SOLN
50.0000 ug | Freq: Once | INTRAMUSCULAR | Status: AC
  Administered 2015-01-10: 50 ug via INTRAMUSCULAR
  Filled 2015-01-10: qty 2

## 2015-01-10 MED ORDER — HYDROCODONE-ACETAMINOPHEN 5-325 MG PO TABS: 1.0000 | ORAL_TABLET | Freq: Four times a day (QID) | ORAL | Status: DC | PRN

## 2015-01-10 MED ORDER — CYCLOBENZAPRINE HCL 5 MG PO TABS: 5.0000 mg | ORAL_TABLET | Freq: Three times a day (TID) | ORAL | Status: DC

## 2015-01-10 NOTE — ED Notes (Signed)
Bed: WA03 Expected date:  Expected time:  Means of arrival:  Comments: 

## 2015-01-10 NOTE — ED Notes (Signed)
Pt reports lower back pain that began approx 8hrs pta - pt states "I think I just pulled a muscle, I have done that before." pt denies any other associating symptoms - denies urinary/bowel symptoms, n/v or fever.

## 2015-01-10 NOTE — ED Notes (Signed)
Bed: WA06 Expected date:  Expected time:  Means of arrival:  Comments: 

## 2015-01-10 NOTE — ED Provider Notes (Signed)
CSN: [id number]     Arrival date & time 01/09/15  2345 History   First MD Initiated Contact with Patient 01/10/15 0221     Chief Complaint  Patient presents with  . Back Pain     (Consider location/radiation/quality/duration/timing/severity/associated sxs/prior Treatment) HPI Comments: Patient was doing house work when she wen tto stand up had pain in lower back that radiated to the right posterior heel area and posterior right thigh.  This has been persistent throughout the day.  She is taken anti-inflammatory without any relief.  She has a history of doing this one time befor  Patient is a 29 y.o. female presenting with back pain. The history is provided by the patient.  Back Pain Location:  Lumbar spine Quality:  Aching Radiates to:  R posterior upper leg and R thigh Pain severity:  Moderate Pain is:  Same all the time Onset quality:  Gradual Timing:  Constant Progression:  Worsening Chronicity:  Recurrent Context: physical stress   Relieved by:  Nothing Worsened by:  Movement Associated symptoms: no chest pain, no headaches and no numbness     Past Medical History  Diagnosis Date  . SVT (supraventricular tachycardia)   . Genital warts   . No pertinent past medical history   . Gestational diabetes     first pregnancy  . Gonorrhea     1st trimester this pregnancy   Past Surgical History  Procedure Laterality Date  . Removal of warts     Family History  Problem Relation Age of Onset  . Diabetes Mother   . Hypertension Mother   . Depression Mother   . Anxiety disorder Mother   . Diabetes Maternal Grandmother   . Hypertension Maternal Grandmother    Social History  Substance Use Topics  . Smoking status: Current Every Day Smoker -- 0.50 packs/day for 15 years    Types: Cigarettes  . Smokeless tobacco: Never Used  . Alcohol Use: Yes     Comment: occasionally   OB History    Gravida Para Term Preterm AB TAB SAB Ectopic Multiple Living   4 3 3  0 1 0 1 0 0 3       Review of Systems  Respiratory: Positive for shortness of breath.   Cardiovascular: Negative for chest pain.  Musculoskeletal: Positive for back pain.  Skin: Negative for rash.  Neurological: Negative for dizziness, numbness and headaches.  All other systems reviewed and are negative.     Allergies  Review of patient's allergies indicates no known allergies.  Home Medications   Prior to Admission medications   Medication Sig Start Date End Date Taking? Authorizing Provider  amoxicillin (AMOXIL) 500 MG capsule Take 1 capsule (500 mg total) by mouth 3 (three) times daily. Patient not taking: Reported on 09/08/2014 07/02/14   Francee Piccolo, PA-C  atenolol (TENORMIN) 25 MG tablet Take 1 tablet (25 mg total) by mouth daily. 02/18/14   Peter M [place], MD  cyclobenzaprine (FLEXERIL) 5 MG tablet Take 1 tablet (5 mg total) by mouth 3 (three) times daily. 01/10/15   Earley Favor, NP  HYDROcodone-acetaminophen (NORCO/VICODIN) 5-325 MG per tablet Take 1 tablet by mouth every 6 (six) hours as needed for severe pain. 01/10/15   Earley Favor, NP  omeprazole (PRILOSEC) 20 MG capsule Take 20 mg by mouth daily as needed. 07/29/14   Historical Provider, MD  oxyCODONE-acetaminophen (PERCOCET/ROXICET) 5-325 MG per tablet Take 1 tablet by mouth every 4 (four) hours as needed for severe pain. May take  2 tablets PO q 6 hours for severe pain - Do not take with Tylenol as this tablet already contains tylenol Patient not taking: Reported on 09/08/2014 07/02/14   Francee Piccolo, PA-C  predniSONE (DELTASONE) 20 MG tablet 3 Tabs PO Days 1-3, then 2 tabs PO Days 4-6, then 1 tab PO Day 7-9, then Half Tab PO Day 10-12 01/10/15   Earley Favor, NP   BP 137/60 mmHg  Pulse 83  Temp(Src) 98.2 F (36.8 C) (Oral)  Resp 20  Ht  (1.753 m)  Wt 270 lb (122.471 kg)  BMI 39.85 kg/m2  SpO2 99% Physical Exam  Constitutional: She appears well-developed and well-nourished.  HENT:  Head: Normocephalic.  Eyes:  Pupils are equal, round, and reactive to light.  Neck: Normal range of motion.  Cardiovascular: Normal rate.   Pulmonary/Chest: Effort normal.  Musculoskeletal: She exhibits tenderness.       Back:  Neurological: She is alert.  Skin: Skin is warm.  Nursing note and vitals reviewed.   ED Course  Procedures (including critical care time) Labs Review Labs Reviewed - No data to display  Imaging Review No results found. I have personally reviewed and evaluated these images and lab results as part of my medical decision-making.   EKG Interpretation None     Patient will be treated with steroid-induced for an anti-inflammatory Flexeril for muscle relaxer, and she's been given several hydrocodone for severe pain, and return parameters MDM   Final diagnoses:  Sciatica, right         Earley Favor, NP 01/10/15 0307  Earley Favor, NP 01/10/15 1610  Dione Booze, MD 01/10/15 [phone]

## 2015-01-10 NOTE — Discharge Instructions (Signed)
You diminution is consistent with sciatica, radiating to your right buttock and down your right leg.  This is treated with an anti-inflammatory, i.e. prednisone as well as a muscle relaxer, i.e. Flexeril and Norco, i.e., hydrocodone for severe pain as needed.  Please follow-up with your primary care physician.  Return if worsening symptoms

## 2015-01-13 ENCOUNTER — Encounter (HOSPITAL_COMMUNITY): Payer: Self-pay | Admitting: *Deleted

## 2015-01-13 DIAGNOSIS — Z8632 Personal history of gestational diabetes: Secondary | ICD-10-CM | POA: Diagnosis not present

## 2015-01-13 DIAGNOSIS — M5417 Radiculopathy, lumbosacral region: Secondary | ICD-10-CM | POA: Diagnosis not present

## 2015-01-13 DIAGNOSIS — Z720 Tobacco use: Secondary | ICD-10-CM | POA: Diagnosis not present

## 2015-01-13 DIAGNOSIS — Z8619 Personal history of other infectious and parasitic diseases: Secondary | ICD-10-CM | POA: Diagnosis not present

## 2015-01-13 DIAGNOSIS — Z79899 Other long term (current) drug therapy: Secondary | ICD-10-CM | POA: Insufficient documentation

## 2015-01-13 DIAGNOSIS — M545 Low back pain: Secondary | ICD-10-CM | POA: Diagnosis present

## 2015-01-13 MED ORDER — IBUPROFEN 400 MG PO TABS
ORAL_TABLET | ORAL | Status: AC
  Filled 2015-01-13: qty 2

## 2015-01-13 MED ORDER — IBUPROFEN 400 MG PO TABS
800.0000 mg | ORAL_TABLET | Freq: Once | ORAL | Status: AC
  Administered 2015-01-13: 800 mg via ORAL

## 2015-01-13 NOTE — ED Notes (Signed)
Patient presents via EMS with c/o left side lower back pain that runs down the side of her leg to her knee.  Seen Sat at [place] and dx with siatica

## 2015-01-14 ENCOUNTER — Emergency Department (HOSPITAL_COMMUNITY)
Admission: EM | Admit: 2015-01-14 | Discharge: 2015-01-14 | Disposition: A | Discharge status: Home / Self Care | Payer: Medicaid Other | Attending: Emergency Medicine | Admitting: Emergency Medicine

## 2015-01-14 DIAGNOSIS — M5417 Radiculopathy, lumbosacral region: Secondary | ICD-10-CM

## 2015-01-14 MED ORDER — HYDROMORPHONE HCL 1 MG/ML IJ SOLN
1.0000 mg | Freq: Once | INTRAMUSCULAR | Status: AC
  Administered 2015-01-14: 1 mg via INTRAMUSCULAR
  Filled 2015-01-14: qty 1

## 2015-01-14 MED ORDER — DICLOFENAC SODIUM 1 % TD GEL: 2.0000 g | Freq: Four times a day (QID) | TRANSDERMAL | Status: DC

## 2015-01-14 NOTE — Discharge Instructions (Signed)

## 2015-01-14 NOTE — ED Provider Notes (Signed)
CSN: [id number]     Arrival date & time 01/13/15  2309 History   None    Chief Complaint  Patient presents with  . Back Pain     (Consider location/radiation/quality/duration/timing/severity/associated sxs/prior Treatment) HPI   29 year old female brought here via EMS for c/o back pain.  Pt report she developed non traumatic back pain 5 days ago after she was doing some house work.  Described pain as sharp, started in her left lower back and radiates down her L leg towards the knee.  Pain worsening with ambulating and sitting.  Pt was seen in ED at [facility] 4 days ago for this complaint and was diagnosed with sciatica.  She was prescribed muscle relaxant, steroid and antiinflammatory meds along with a few hydrocodone as treatment.  Pt states she did take the medication as prescribed but the pain has persisted and worsening.  Pain is 10/10, persistent.  She has difficulty ambulating 2/2 pain.  Denies fever, chills, abd pain, hematuria, dysuria, bowel/bladder incontinence, saddle anesthesia or rash.  Denies hx of IVDU or active cancer.  Pt is a stay at home mom.  Past Medical History  Diagnosis Date  . SVT (supraventricular tachycardia)   . Genital warts   . No pertinent past medical history   . Gestational diabetes     first pregnancy  . Gonorrhea     1st trimester this pregnancy   Past Surgical History  Procedure Laterality Date  . Removal of warts     Family History  Problem Relation Age of Onset  . Diabetes Mother   . Hypertension Mother   . Depression Mother   . Anxiety disorder Mother   . Diabetes Maternal Grandmother   . Hypertension Maternal Grandmother    Social History  Substance Use Topics  . Smoking status: Current Every Day Smoker -- 0.50 packs/day for 15 years    Types: Cigarettes  . Smokeless tobacco: Never Used  . Alcohol Use: Yes     Comment: occasionally   OB History    Gravida Para Term Preterm AB TAB SAB Ectopic Multiple Living   0 1 0 1 0 0 3      Review of Systems  Constitutional: Negative for fever.  Musculoskeletal: Positive for back pain.  Neurological: Negative for numbness.  All other systems reviewed and are negative.     Allergies  Review of patient's allergies indicates no known allergies.  Home Medications   Prior to Admission medications   Medication Sig Start Date End Date Taking? Authorizing Provider  amoxicillin (AMOXIL) 500 MG capsule Take 1 capsule (500 mg total) by mouth 3 (three) times daily. Patient not taking: Reported on 09/08/2014 07/02/14   Francee Piccolo, PA-C  atenolol (TENORMIN) 25 MG tablet Take 1 tablet (25 mg total) by mouth daily. 02/18/14   Peter M [place], MD  cyclobenzaprine (FLEXERIL) 5 MG tablet Take 1 tablet (5 mg total) by mouth 3 (three) times daily. 01/10/15   Earley Favor, NP  HYDROcodone-acetaminophen (NORCO/VICODIN) 5-325 MG per tablet Take 1 tablet by mouth every 6 (six) hours as needed for severe pain. 01/10/15   Earley Favor, NP  omeprazole (PRILOSEC) 20 MG capsule Take 20 mg by mouth daily as needed. 07/29/14   Historical Provider, MD  oxyCODONE-acetaminophen (PERCOCET/ROXICET) 5-325 MG per tablet Take 1 tablet by mouth every 4 (four) hours as needed for severe pain. May take 2 tablets PO q 6 hours for severe pain - Do not take with Tylenol  as this tablet already contains tylenol Patient not taking: Reported on 09/08/2014 07/02/14   Francee Piccolo, PA-C  predniSONE (DELTASONE) 20 MG tablet 3 Tabs PO Days 1-3, then 2 tabs PO Days 4-6, then 1 tab PO Day 7-9, then Half Tab PO Day 10-12 01/10/15   Earley Favor, NP   BP 132/90 mmHg  Pulse 91  Temp(Src) 97.2 F (36.2 C) (Oral)  Resp 20  Ht 5\' 9"  (1.753 m)  Wt 265 lb (120.203 kg)  BMI 39.12 kg/m2  SpO2 94% Physical Exam  Constitutional: She appears well-developed and well-nourished.  Morbidly obese AAF laying in bed, non toxic  HENT:  Head: Atraumatic.  Eyes: Conjunctivae are normal.  Neck: Neck supple.  Cardiovascular: Intact  distal pulses.   Abdominal: Soft. There is no tenderness.  Musculoskeletal: She exhibits tenderness (tenderness to L lumbosacral region, with +SLR to left leg.  no rash or swelling noted.  ).  Neurological: She is alert.  Patella DTR intact bilaterally, no foot drop.    5/5 strength to BLE.  Sensation intact, distal pulses intact.  Skin: No rash noted.  Psychiatric: She has a normal mood and affect.  Nursing note and vitals reviewed.   ED Course  Procedures (including critical care time)  Reproducible L lumbosacral pain.  Pt is NVI. NO red flags.  Difficulty ambulating 2/2 pain but has normal strength.  Was seen 4 days ago for same, and received steroid/muscle relaxant/NSAIDs/narcotic.  I'm concern that her large body habitus may have worsening her sxs.  Low suspicion for spinal cord compression or caudal equina syndrome.  Pain medication given in ER.  Encourage pt to f/u with orthopedist for further care.  Return precaution discussed.  Advance imaging not indicated at this time as this is atraumatic.      MDM   Final diagnoses:  Lumbosacral radiculopathy    BP 132/90 mmHg  Pulse 91  Temp(Src) 97.2 F (36.2 C) (Oral)  Resp 20  Ht 5\' 9"  (1.753 m)  Wt 265 lb (120.203 kg)  BMI 39.12 kg/m2  SpO2 94%     Fayrene Helper, PA-C 01/14/15 0159  Derwood Kaplan, MD 01/14/15 [phone]

## 2015-04-26 ENCOUNTER — Encounter (HOSPITAL_COMMUNITY): Payer: Self-pay | Admitting: *Deleted

## 2015-04-26 ENCOUNTER — Emergency Department (HOSPITAL_COMMUNITY)
Admission: EM | Admit: 2015-04-26 | Discharge: 2015-04-26 | Disposition: A | Discharge status: Home / Self Care | Payer: Medicaid Other | Attending: Emergency Medicine | Admitting: Emergency Medicine

## 2015-04-26 ENCOUNTER — Emergency Department (HOSPITAL_COMMUNITY): Payer: Medicaid Other

## 2015-04-26 DIAGNOSIS — J209 Acute bronchitis, unspecified: Secondary | ICD-10-CM | POA: Insufficient documentation

## 2015-04-26 DIAGNOSIS — F17210 Nicotine dependence, cigarettes, uncomplicated: Secondary | ICD-10-CM | POA: Diagnosis not present

## 2015-04-26 DIAGNOSIS — Z79899 Other long term (current) drug therapy: Secondary | ICD-10-CM | POA: Insufficient documentation

## 2015-04-26 DIAGNOSIS — Z8632 Personal history of gestational diabetes: Secondary | ICD-10-CM | POA: Insufficient documentation

## 2015-04-26 DIAGNOSIS — I10 Essential (primary) hypertension: Secondary | ICD-10-CM | POA: Diagnosis not present

## 2015-04-26 DIAGNOSIS — R05 Cough: Secondary | ICD-10-CM | POA: Diagnosis present

## 2015-04-26 DIAGNOSIS — Z8619 Personal history of other infectious and parasitic diseases: Secondary | ICD-10-CM | POA: Diagnosis not present

## 2015-04-26 DIAGNOSIS — Z8639 Personal history of other endocrine, nutritional and metabolic disease: Secondary | ICD-10-CM | POA: Diagnosis not present

## 2015-04-26 HISTORY — DX: Essential (primary) hypertension: I10

## 2015-04-26 HISTORY — DX: Hyperlipidemia, unspecified: E78.5

## 2015-04-26 MED ORDER — PROMETHAZINE-CODEINE 6.25-10 MG/5ML PO SYRP: 5.0000 mL | ORAL_SOLUTION | ORAL | Status: DC | PRN

## 2015-04-26 MED ORDER — ALBUTEROL SULFATE HFA 108 (90 BASE) MCG/ACT IN AERS
2.0000 | INHALATION_SPRAY | RESPIRATORY_TRACT | Status: DC | PRN
  Administered 2015-04-26: 2 via RESPIRATORY_TRACT
  Filled 2015-04-26: qty 6.7

## 2015-04-26 NOTE — Discharge Instructions (Signed)
Acute Bronchitis Bronchitis is inflammation of the airways that extend from the windpipe into the lungs (bronchi). The inflammation often causes mucus to develop. This leads to a cough, which is the most common symptom of bronchitis.  In acute bronchitis, the condition usually develops suddenly and goes away over time, usually in a couple weeks. Smoking, allergies, and asthma can make bronchitis worse. Repeated episodes of bronchitis may cause further lung problems.  CAUSES Acute bronchitis is most often caused by the same virus that causes a cold. The virus can spread from person to person (contagious) through coughing, sneezing, and touching contaminated objects. SIGNS AND SYMPTOMS   Cough.   Fever.   Coughing up mucus.   Body aches.   Chest congestion.   Chills.   Shortness of breath.   Sore throat.  DIAGNOSIS  Acute bronchitis is usually diagnosed through a physical exam. Your health care provider will also ask you questions about your medical history. Tests, such as chest X-rays, are sometimes done to rule out other conditions.  TREATMENT  Acute bronchitis usually goes away in a couple weeks. Oftentimes, no medical treatment is necessary. Medicines are sometimes given for relief of fever or cough. Antibiotic medicines are usually not needed but may be prescribed in certain situations. In some cases, an inhaler may be recommended to help reduce shortness of breath and control the cough. A cool mist vaporizer may also be used to help thin bronchial secretions and make it easier to clear the chest.  HOME CARE INSTRUCTIONS  Get plenty of rest.   Drink enough fluids to keep your urine clear or pale yellow (unless you have a medical condition that requires fluid restriction). Increasing fluids may help thin your respiratory secretions (sputum) and reduce chest congestion, and it will prevent dehydration.   Take medicines only as directed by your health care provider.  If  you were prescribed an antibiotic medicine, finish it all even if you start to feel better.  Avoid smoking and secondhand smoke. Exposure to cigarette smoke or irritating chemicals will make bronchitis worse. If you are a smoker, consider using nicotine gum or skin patches to help control withdrawal symptoms. Quitting smoking will help your lungs heal faster.   Reduce the chances of another bout of acute bronchitis by washing your hands frequently, avoiding people with cold symptoms, and trying not to touch your hands to your mouth, nose, or eyes.   Keep all follow-up visits as directed by your health care provider.  SEEK MEDICAL CARE IF: Your symptoms do not improve after 1 week of treatment.  SEEK IMMEDIATE MEDICAL CARE IF:  You develop an increased fever or chills.   You have chest pain.   You have severe shortness of breath.  You have bloody sputum.   You develop dehydration.  You faint or repeatedly feel like you are going to pass out.  You develop repeated vomiting.  You develop a severe headache. MAKE SURE YOU:   Understand these instructions.  Will watch your condition.  Will get help right away if you are not doing well or get worse.   This information is not intended to replace advice given to you by your health care provider. Make sure you discuss any questions you have with your health care provider.   Document Released: 05/25/2004 Document Revised: 05/08/2014 Document Reviewed: 10/08/2012 Elsevier Interactive Patient Education 2016 [employer].   Used to plus severe inhaler every 4 hours when necessary coughing or wheezing.  Do not drive  within 4 hours of taking the cough syrup at this may make you drowsy.

## 2015-04-26 NOTE — ED Notes (Signed)
Pt comes in with non-productive cough that started 12/24. Pt denies any n/v/d. States she has had intermittent fevers.

## 2015-04-26 NOTE — ED Provider Notes (Signed)
CSN: [id number]     Arrival date & time 04/26/15  1705 History  By signing my name below, I, [facility], attest that this documentation has been prepared under the direction and in the presence of Burgess Amor, PA-C. Electronically Signed: Randell Patient, ED Scribe. 04/26/2015. 7:03 PM.   No chief complaint on file.  The history is provided by the patient. No language interpreter was used.   HPI Comments: Dorothy Meadows is a 29 y.o. female with hx of supraventricular tachycardia who presents to the Emergency Department complaining of non-productive, gradual onset, intermittent cough onset 2 days ago. Patient endorses fever TMAX 103, chills, sore throat secondary to cough, chest pain secondary to coughing, SOB, wheezing, HA, congestion, and clear, thin nasal discharge. She notes that she had an episode of tachycardia 2 days ago which has resolved but has been taking generic OTC cough/cold medications which contain pseudoephedrine without relief and an albuterol inhaler last night with some relief. Per patient, she takes medication to treat her supraventricular tachycardia. Patient denies hx of asthma. She denies sneezing and facial pain. Patient is a current smoker 0.5 ppd but has reduced smoking since onset of symptoms. NAD.  Past Medical History  Diagnosis Date  . SVT (supraventricular tachycardia) (HCC)   . Genital warts   . No pertinent past medical history   . Gestational diabetes     first pregnancy  . Gonorrhea     1st trimester this pregnancy  . Hypertension   . Hyperlipidemia    Past Surgical History  Procedure Laterality Date  . Removal of warts     Family History  Problem Relation Age of Onset  . Diabetes Mother   . Hypertension Mother   . Depression Mother   . Anxiety disorder Mother   . Diabetes Maternal Grandmother   . Hypertension Maternal Grandmother    Social History  Substance Use Topics  . Smoking status: Current Every Day Smoker -- 0.50 packs/day  for 15 years    Types: Cigarettes  . Smokeless tobacco: Never Used  . Alcohol Use: Yes     Comment: occasionally   OB History    Gravida Para Term Preterm AB TAB SAB Ectopic Multiple Living   0 1 0 1 0 0 3     Review of Systems  Constitutional: Positive for fever and chills.  HENT: Positive for congestion and sore throat (secondary to cough). Negative for sneezing.   Respiratory: Positive for cough, shortness of breath and wheezing.   Cardiovascular: Positive for chest pain (secondary to cough).  Neurological: Positive for headaches.      Allergies  Review of patient's allergies indicates no known allergies.  Home Medications   Prior to Admission medications   Medication Sig Start Date End Date Taking? Authorizing Provider  amoxicillin (AMOXIL) 500 MG capsule Take 1 capsule (500 mg total) by mouth 3 (three) times daily. Patient not taking: Reported on 09/08/2014 07/02/14   Francee Piccolo, PA-C  atenolol (TENORMIN) 25 MG tablet Take 1 tablet (25 mg total) by mouth daily. 02/18/14   Peter M [place], MD  cyclobenzaprine (FLEXERIL) 5 MG tablet Take 1 tablet (5 mg total) by mouth 3 (three) times daily. 01/10/15   Earley Favor, NP  diclofenac sodium (VOLTAREN) 1 % GEL Apply 2 g topically 4 (four) times daily. Apply to affected area 4 times daily as needed for pain. 01/14/15   Fayrene Helper, PA-C  HYDROcodone-acetaminophen (NORCO/VICODIN) 5-325 MG per tablet Take 1 tablet by  mouth every 6 (six) hours as needed for severe pain. 01/10/15   Earley FavorGail Schulz, NP  omeprazole (PRILOSEC) 20 MG capsule Take 20 mg by mouth daily as needed. 07/29/14   Historical Provider, MD  oxyCODONE-acetaminophen (PERCOCET/ROXICET) 5-325 MG per tablet Take 1 tablet by mouth every 4 (four) hours as needed for severe pain. May take 2 tablets PO q 6 hours for severe pain - Do not take with Tylenol as this tablet already contains tylenol Patient not taking: Reported on 09/08/2014 07/02/14   Victorino DikeJennifer Piepenbrink, PA-C   predniSONE (DELTASONE) 20 MG tablet 3 Tabs PO Days 1-3, then 2 tabs PO Days 4-6, then 1 tab PO Day 7-9, then Half Tab PO Day 10-12 01/10/15   Earley FavorGail Schulz, NP  promethazine-codeine (PHENERGAN WITH CODEINE) 6.25-10 MG/5ML syrup Take 5 mLs by mouth every 4 (four) hours as needed for cough. 04/26/15   Burgess AmorJulie Layton Tappan, PA-C   BP 133/88 mmHg  Pulse 98  Temp(Src) 99.3 F (37.4 C) (Oral)  Resp 18  Ht 5\' 9"  (1.753 m)  Wt 120.657 kg  BMI 39.26 kg/m2  SpO2 98% Physical Exam  Constitutional: She is oriented to person, place, and time. She appears well-developed and well-nourished.  HENT:  Head: Normocephalic and atraumatic.  Right Ear: Tympanic membrane and ear canal normal.  Left Ear: Tympanic membrane and ear canal normal.  Nose: Mucosal edema and rhinorrhea present.  Mouth/Throat: Uvula is midline, oropharynx is clear and moist and mucous membranes are normal. No oropharyngeal exudate, posterior oropharyngeal edema, posterior oropharyngeal erythema or tonsillar abscesses.  Eyes: Conjunctivae are normal.  Cardiovascular: Normal rate and normal heart sounds.   Pulmonary/Chest: Effort normal. No respiratory distress. She has no rhonchi. She has no rales.  Scattered expiratory wheeze throughout all lung fields.  Abdominal: Soft. There is no tenderness.  Musculoskeletal: Normal range of motion.  Neurological: She is alert and oriented to person, place, and time.  Skin: Skin is warm and dry. No rash noted.  Psychiatric: She has a normal mood and affect.    ED Course  Procedures   DIAGNOSTIC STUDIES: Oxygen Saturation is 99% on RA, normal by my interpretation.    COORDINATION OF CARE: 6:50 PM Discussed treatment plan with pt at bedside and pt agreed to plan.  Albuterol mdi with spacer, tx here with resolution of wheezing.  Phenergan/codeine for cough syrup.  Advised pt to avoid products with pseudoephedrine.   Prn f/u for any worsened sx.  Discussed smoking cessation.  Labs Review Labs Reviewed  - No data to display  Imaging Review Dg Chest 2 View  04/26/2015  CLINICAL DATA:  Nonproductive cough with fever EXAM: CHEST  2 VIEW COMPARISON:  All FINDINGS: Normal mediastinum and cardiac silhouette. Normal pulmonary vasculature. No evidence of effusion, infiltrate, or pneumothorax. No acute bony abnormality. IMPRESSION: No acute cardiopulmonary process. Electronically Signed   By: Genevive BiStewart  Edmunds M.D.   On: 04/26/2015 18:25   I have personally reviewed and evaluated these images and lab results as part of my medical decision-making.   EKG Interpretation None      MDM   Final diagnoses:  Acute bronchitis, unspecified organism      I personally performed the services described in this documentation, which was scribed in my presence. The recorded information has been reviewed and is accurate.   Burgess AmorJulie Patches Mcdonnell, PA-C 04/27/15 1423  Zadie Rhineonald Wickline, MD 04/27/15 [phone]

## 2015-06-12 ENCOUNTER — Inpatient Hospital Stay (HOSPITAL_COMMUNITY)
Admission: AD | Admit: 2015-06-12 | Discharge: 2015-06-12 | Disposition: A | Discharge status: Home / Self Care | Payer: Medicaid Other | Source: Ambulatory Visit | Attending: Family Medicine | Admitting: Family Medicine

## 2015-06-12 ENCOUNTER — Encounter (HOSPITAL_COMMUNITY): Payer: Self-pay | Admitting: *Deleted

## 2015-06-12 DIAGNOSIS — E785 Hyperlipidemia, unspecified: Secondary | ICD-10-CM | POA: Diagnosis not present

## 2015-06-12 DIAGNOSIS — Z8632 Personal history of gestational diabetes: Secondary | ICD-10-CM | POA: Diagnosis not present

## 2015-06-12 DIAGNOSIS — Z833 Family history of diabetes mellitus: Secondary | ICD-10-CM | POA: Diagnosis not present

## 2015-06-12 DIAGNOSIS — R102 Pelvic and perineal pain: Secondary | ICD-10-CM | POA: Diagnosis present

## 2015-06-12 DIAGNOSIS — I10 Essential (primary) hypertension: Secondary | ICD-10-CM | POA: Diagnosis not present

## 2015-06-12 DIAGNOSIS — Z8249 Family history of ischemic heart disease and other diseases of the circulatory system: Secondary | ICD-10-CM | POA: Insufficient documentation

## 2015-06-12 DIAGNOSIS — Z8619 Personal history of other infectious and parasitic diseases: Secondary | ICD-10-CM | POA: Diagnosis not present

## 2015-06-12 DIAGNOSIS — F17210 Nicotine dependence, cigarettes, uncomplicated: Secondary | ICD-10-CM | POA: Diagnosis not present

## 2015-06-12 DIAGNOSIS — Z79899 Other long term (current) drug therapy: Secondary | ICD-10-CM | POA: Insufficient documentation

## 2015-06-12 LAB — WET PREP, GENITAL
Clue Cells Wet Prep HPF POC: NONE SEEN
Sperm: NONE SEEN
TRICH WET PREP: NONE SEEN
YEAST WET PREP: NONE SEEN

## 2015-06-12 LAB — URINALYSIS, ROUTINE W REFLEX MICROSCOPIC
BILIRUBIN URINE: NEGATIVE
Glucose, UA: NEGATIVE mg/dL
Hgb urine dipstick: NEGATIVE
KETONES UR: NEGATIVE mg/dL
LEUKOCYTES UA: NEGATIVE
NITRITE: NEGATIVE
PH: 6 (ref 5.0–8.0)
PROTEIN: NEGATIVE mg/dL
Specific Gravity, Urine: >1.03 — ABNORMAL HIGH (ref 1.005–1.030)

## 2015-06-12 LAB — POCT PREGNANCY, URINE: PREG TEST UR: NEGATIVE

## 2015-06-12 MED ORDER — IBUPROFEN 800 MG PO TABS
800.0000 mg | ORAL_TABLET | Freq: Once | ORAL | Status: AC
  Administered 2015-06-12: 800 mg via ORAL
  Filled 2015-06-12: qty 1

## 2015-06-12 NOTE — Progress Notes (Signed)
Dr Newton in earlier to discuss test results and d/c plan. Written and verbal d/c instructions given and understanding voiced 

## 2015-06-12 NOTE — Discharge Instructions (Signed)
Your pain is most likely muscular. You can take 600-800mg  (3 or 4 tabs) of regular Advil or Ibuprofen for this which can be purchased without a prescription.   Abdominal Pain, Adult Many things can cause abdominal pain. Usually, abdominal pain is not caused by a disease and will improve without treatment. It can often be observed and treated at home. Your health care provider will do a physical exam and possibly order blood tests and X-rays to help determine the seriousness of your pain. However, in many cases, more time must pass before a clear cause of the pain can be found. Before that point, your health care provider may not know if you need more testing or further treatment. HOME CARE INSTRUCTIONS Monitor your abdominal pain for any changes. The following actions may help to alleviate any discomfort you are experiencing:  Only take over-the-counter or prescription medicines as directed by your health care provider.  Do not take laxatives unless directed to do so by your health care provider.  Try a clear liquid diet (broth, tea, or water) as directed by your health care provider. Slowly move to a bland diet as tolerated. SEEK MEDICAL CARE IF:  You have unexplained abdominal pain.  You have abdominal pain associated with nausea or diarrhea.  You have pain when you urinate or have a bowel movement.  You experience abdominal pain that wakes you in the night.  You have abdominal pain that is worsened or improved by eating food.  You have abdominal pain that is worsened with eating fatty foods.  You have a fever. SEEK IMMEDIATE MEDICAL CARE IF:  Your pain does not go away within 2 hours.  You keep throwing up (vomiting).  Your pain is felt only in portions of the abdomen, such as the right side or the left lower portion of the abdomen.  You pass bloody or black tarry stools. MAKE SURE YOU:  Understand these instructions.  Will watch your condition.  Will get help right away  if you are not doing well or get worse.   This information is not intended to replace advice given to you by your health care provider. Make sure you discuss any questions you have with your health care provider.   Document Released: 01/25/2005 Document Revised: 01/06/2015 Document Reviewed: 12/25/2012 Elsevier Interactive Patient Education [employer]

## 2015-06-12 NOTE — MAU Note (Signed)
Had intercourse Thursday night and Friday morning. Been with same partner for years. Friday morning started with pain which has continued. Hurts more when i walk. Denies vag bleeding or d/c

## 2015-06-12 NOTE — Progress Notes (Signed)
Dr Alvester Morin notified of pt's admission and status. Aware of neg upt and u/a in process. Will see pt

## 2015-06-12 NOTE — MAU Provider Note (Signed)
History     CSN: [id number] Arrival date and time: 06/12/15 0247 First Provider Initiated Contact with Patient 06/12/15 [phone]      Chief Complaint  Patient presents with  . Pelvic Pain   HPI Dorothy Meadows is a 30 y.o. [phone] presenting with pelvic pain/lower abdominal pain   Pelvic pain: Started 1 day ago. Reports intercourse Thurs night and Friday morning. Reports that on Friday AM pelvic pain started and has continued. Reports worsening with movement. Denies vaginal bleeding. No new partner- same one for "years." Reports the pain in across her lower abdomen and hurts worse with walking and moving. She did not try any OTC medications.  She reports it "just feels like something is wrong" and she is unable to clarify this on further questioning.  She has never had pain like this before. Denies vaginal discharge. Denies vaginal pain. Denies vaginal odor.   Has Nexplanon in place  OB History    Gravida Para Term Preterm AB TAB SAB Ectopic Multiple Living   0 1 0 1 0 0 3      Past Medical History  Diagnosis Date  . SVT (supraventricular tachycardia) (HCC)   . Genital warts   . No pertinent past medical history   . Gestational diabetes     first pregnancy  . Gonorrhea     1st trimester this pregnancy  . Hypertension   . Hyperlipidemia     Past Surgical History  Procedure Laterality Date  . Removal of warts      Family History  Problem Relation Age of Onset  . Diabetes Mother   . Hypertension Mother   . Depression Mother   . Anxiety disorder Mother   . Diabetes Maternal Grandmother   . Hypertension Maternal Grandmother     Social History  Substance Use Topics  . Smoking status: Current Every Day Smoker -- 0.50 packs/day for 15 years    Types: Cigarettes  . Smokeless tobacco: Never Used  . Alcohol Use: Yes     Comment: occasionally    Allergies: No Known Allergies  Prescriptions prior to admission  Medication Sig Dispense Refill Last Dose  . atenolol  (TENORMIN) 25 MG tablet Take 1 tablet (25 mg total) by mouth daily. 30 tablet 11 06/11/2015 at Unknown time  . ibuprofen (ADVIL,MOTRIN) 800 MG tablet Take 800 mg by mouth every 8 (eight) hours as needed.   Past Week at Unknown time  . omeprazole (PRILOSEC) 20 MG capsule Take 20 mg by mouth daily as needed.  3 Past Week at Unknown time  . amoxicillin (AMOXIL) 500 MG capsule Take 1 capsule (500 mg total) by mouth 3 (three) times daily. (Patient not taking: Reported on 09/08/2014) 21 capsule 0 Not Taking at Unknown time  . cyclobenzaprine (FLEXERIL) 5 MG tablet Take 1 tablet (5 mg total) by mouth 3 (three) times daily. 30 tablet 0   . diclofenac sodium (VOLTAREN) 1 % GEL Apply 2 g topically 4 (four) times daily. Apply to affected area 4 times daily as needed for pain. 100 g 0   . HYDROcodone-acetaminophen (NORCO/VICODIN) 5-325 MG per tablet Take 1 tablet by mouth every 6 (six) hours as needed for severe pain. 10 tablet 0   . oxyCODONE-acetaminophen (PERCOCET/ROXICET) 5-325 MG per tablet Take 1 tablet by mouth every 4 (four) hours as needed for severe pain. May take 2 tablets PO q 6 hours for severe pain - Do not take with Tylenol as this tablet already contains  tylenol (Patient not taking: Reported on 09/08/2014) 15 tablet 0 Not Taking at Unknown time  . predniSONE (DELTASONE) 20 MG tablet 3 Tabs PO Days 1-3, then 2 tabs PO Days 4-6, then 1 tab PO Day 7-9, then Half Tab PO Day 10-12 20 tablet 0   . promethazine-codeine (PHENERGAN WITH CODEINE) 6.25-10 MG/5ML syrup Take 5 mLs by mouth every 4 (four) hours as needed for cough. 120 mL 0     Review of Systems  Constitutional: Negative for fever and chills.  Eyes: Negative for blurred vision and double vision.  Respiratory: Negative for cough and shortness of breath.   Cardiovascular: Negative for chest pain and orthopnea.  Gastrointestinal: Negative for nausea and vomiting.  Genitourinary: Negative for dysuria, frequency and flank pain.  Musculoskeletal:  Negative for myalgias.  Skin: Negative for rash.  Neurological: Negative for dizziness, tingling, weakness and headaches.  Endo/Heme/Allergies: Does not bruise/bleed easily.  Psychiatric/Behavioral: Negative for depression and suicidal ideas. The patient is not nervous/anxious.    Physical Exam   Blood pressure 135/89, pulse 75, temperature 98.1 F (36.7 C), resp. rate 18, height  (1.753 m), weight 266 lb 3.2 oz (120.748 kg).  Physical Exam  Nursing note and vitals reviewed. Constitutional: She is oriented to person, place, and time. She appears well-developed and well-nourished. No distress.  HENT:  Head: Normocephalic and atraumatic.  Eyes: Conjunctivae are normal. No scleral icterus.  Neck: Normal range of motion. Neck supple.  Cardiovascular: Normal rate and intact distal pulses.   Respiratory: Effort normal. She exhibits no tenderness.  GI: Soft. There is no tenderness. There is no rebound and no guarding.  Genitourinary: Uterus normal.    Cervix exhibits no motion tenderness, no discharge and no friability. Right adnexum displays no mass, no tenderness and no fullness. Left adnexum displays no mass, no tenderness and no fullness. Vaginal discharge (thin white, physiologic appearing) found.  Musculoskeletal: Normal range of motion. She exhibits no edema.  Neurological: She is alert and oriented to person, place, and time.  Skin: Skin is warm and dry. No rash noted.  Psychiatric: She has a normal mood and affect.    MAU Course  Procedures  MDM Wet prep- negative GC/CT-pending Ibuprofen  Assessment and Plan  Dorothy Meadows is a 30 y.o. [phone] currently not pregnant reporting pelvic pain with benign exam and normal testing. No reason for empiric GC/CT treatment based on exam.   Discharge home Recommend periodic/intermittent use of ibuprofen Follow up GC/CT cultures  Federico Flake 06/12/2015, 3:26 AM

## 2015-06-14 LAB — GC/CHLAMYDIA PROBE AMP (~~LOC~~) NOT AT ARMC
Chlamydia: NEGATIVE
Neisseria Gonorrhea: NEGATIVE

## 2015-07-26 ENCOUNTER — Encounter (HOSPITAL_COMMUNITY): Payer: Self-pay | Admitting: Emergency Medicine

## 2015-07-26 ENCOUNTER — Emergency Department (HOSPITAL_COMMUNITY): Payer: Medicaid Other

## 2015-07-26 ENCOUNTER — Emergency Department (HOSPITAL_COMMUNITY)
Admission: EM | Admit: 2015-07-26 | Discharge: 2015-07-27 | Disposition: A | Discharge status: Home / Self Care | Payer: Medicaid Other | Attending: Emergency Medicine | Admitting: Emergency Medicine

## 2015-07-26 DIAGNOSIS — Z79899 Other long term (current) drug therapy: Secondary | ICD-10-CM | POA: Insufficient documentation

## 2015-07-26 DIAGNOSIS — E785 Hyperlipidemia, unspecified: Secondary | ICD-10-CM | POA: Diagnosis not present

## 2015-07-26 DIAGNOSIS — I471 Supraventricular tachycardia: Secondary | ICD-10-CM | POA: Diagnosis not present

## 2015-07-26 DIAGNOSIS — I10 Essential (primary) hypertension: Secondary | ICD-10-CM | POA: Diagnosis not present

## 2015-07-26 DIAGNOSIS — F17210 Nicotine dependence, cigarettes, uncomplicated: Secondary | ICD-10-CM | POA: Insufficient documentation

## 2015-07-26 DIAGNOSIS — R0789 Other chest pain: Secondary | ICD-10-CM | POA: Diagnosis present

## 2015-07-26 DIAGNOSIS — E876 Hypokalemia: Secondary | ICD-10-CM | POA: Diagnosis not present

## 2015-07-26 LAB — I-STAT CHEM 8, ED
BUN: 7 mg/dL (ref 6–20)
Calcium, Ion: 1.13 mmol/L (ref 1.12–1.23)
Chloride: 104 mmol/L (ref 101–111)
Creatinine, Ser: 0.9 mg/dL (ref 0.44–1.00)
Glucose, Bld: 92 mg/dL (ref 65–99)
HEMATOCRIT: 48 % — AB (ref 36.0–46.0)
Hemoglobin: 16.3 g/dL — ABNORMAL HIGH (ref 12.0–15.0)
Potassium: 3.1 mmol/L — ABNORMAL LOW (ref 3.5–5.1)
SODIUM: 144 mmol/L (ref 135–145)
TCO2: 22 mmol/L (ref 0–100)

## 2015-07-26 LAB — I-STAT TROPONIN, ED: TROPONIN I, POC: 0 ng/mL (ref 0.00–0.08)

## 2015-07-26 MED ORDER — SODIUM CHLORIDE 0.9 % IV SOLN
Freq: Once | INTRAVENOUS | Status: AC
  Administered 2015-07-26: 500 mL/h via INTRAVENOUS

## 2015-07-26 MED ORDER — ADENOSINE 6 MG/2ML IV SOLN
12.0000 mg | Freq: Once | INTRAVENOUS | Status: AC
  Administered 2015-07-26: 12 mg via INTRAVENOUS

## 2015-07-26 MED ORDER — ADENOSINE 6 MG/2ML IV SOLN
INTRAVENOUS | Status: AC
  Administered 2015-07-26: 12 mg via INTRAVENOUS
  Filled 2015-07-26: qty 4

## 2015-07-26 MED ORDER — ADENOSINE 6 MG/2ML IV SOLN
INTRAVENOUS | Status: AC
  Filled 2015-07-26: qty 6

## 2015-07-26 NOTE — ED Notes (Signed)
Pt MD give 500cc bolus.

## 2015-07-26 NOTE — ED Notes (Signed)
Pt has hx of svt, felt tightness in chest and took extra heart medication , without results, pt is sob and feel like heart is pounding

## 2015-07-26 NOTE — ED Notes (Signed)
Pt on zoll, with pads on, MD coming to bedside, adenosine 12mg  verbal order.

## 2015-07-26 NOTE — ED Provider Notes (Signed)
CSN: [id number]     Arrival date & time 07/26/15  2205 History   First MD Initiated Contact with Patient 07/26/15 2334     Chief Complaint  Patient presents with  . Chest Pain     (Consider location/radiation/quality/duration/timing/severity/associated sxs/prior Treatment) HPI  This is a 30 year old female with history of SVT who presents with tachycardia and palpitations. Patient reports approximately 3:30 PM she noted increased heart rate and palpitations. She has a history of the same. She took an additional dose of her atenolol but palpitations persisted. She also tried vagal maneuvers without success. She does feel tight in her chest. No shortness of breath. Currently sitting still she is pain-free. She states that she just feels palpitations. No recent illnesses or increased caffeine intake. EKG shows SVT.  Past Medical History  Diagnosis Date  . SVT (supraventricular tachycardia) (HCC)   . Genital warts   . No pertinent past medical history   . Gestational diabetes     first pregnancy  . Gonorrhea     1st trimester this pregnancy  . Hypertension   . Hyperlipidemia    Past Surgical History  Procedure Laterality Date  . Removal of warts     Family History  Problem Relation Age of Onset  . Diabetes Mother   . Hypertension Mother   . Depression Mother   . Anxiety disorder Mother   . Diabetes Maternal Grandmother   . Hypertension Maternal Grandmother    Social History  Substance Use Topics  . Smoking status: Current Every Day Smoker -- 0.50 packs/day for 15 years    Types: Cigarettes  . Smokeless tobacco: Never Used  . Alcohol Use: Yes     Comment: occasionally   OB History    Gravida Para Term Preterm AB TAB SAB Ectopic Multiple Living   0 1 0 1 0 0 3     Review of Systems  Constitutional: Negative for fever.  Respiratory: Positive for chest tightness. Negative for shortness of breath.   Cardiovascular: Positive for chest pain and palpitations.   Gastrointestinal: Negative for nausea, vomiting and abdominal pain.  All other systems reviewed and are negative.     Allergies  Review of patient's allergies indicates no known allergies.  Home Medications   Prior to Admission medications   Medication Sig Start Date End Date Taking? Authorizing Provider  atenolol (TENORMIN) 25 MG tablet Take 1 tablet (25 mg total) by mouth daily. 02/18/14  Yes Peter M [place], MD  omeprazole (PRILOSEC) 20 MG capsule Take 20 mg by mouth daily as needed (for acid reflux).  07/29/14  Yes Historical Provider, MD   BP 124/82 mmHg  Pulse 139  Temp(Src) 97.9 F (36.6 C) (Oral)  Resp 26  Ht  (1.753 m)  Wt 260 lb (117.935 kg)  BMI 38.38 kg/m2  SpO2 100% Physical Exam  Constitutional: She is oriented to person, place, and time. She appears well-developed and well-nourished.  Obese, no acute distress  HENT:  Head: Normocephalic and atraumatic.  Cardiovascular: Regular rhythm and normal heart sounds.   Tachycardia  Pulmonary/Chest: Effort normal and breath sounds normal. No respiratory distress. She has no wheezes.  Abdominal: Soft. There is no tenderness.  Musculoskeletal: She exhibits no edema.  Neurological: She is alert and oriented to person, place, and time.  Skin: Skin is warm and dry.  Psychiatric: She has a normal mood and affect.  Nursing note and vitals reviewed.   ED Course  Procedures (including critical care  time)  CRITICAL CARE Performed by: Shon BatonHORTON, Kartik Fernando F   Total critical care time: 20 minutes  Critical care time was exclusive of separately billable procedures and treating other patients.  Critical care was necessary to treat or prevent imminent or life-threatening deterioration.  Critical care was time spent personally by me on the following activities: development of treatment plan with patient and/or surrogate as well as nursing, discussions with consultants, evaluation of patient's response to treatment,  examination of patient, obtaining history from patient or surrogate, ordering and performing treatments and interventions, ordering and review of laboratory studies, ordering and review of radiographic studies, pulse oximetry and re-evaluation of patient's condition.  Labs Review Labs Reviewed  I-STAT CHEM 8, ED - Abnormal; Notable for the following:    Potassium 3.1 (*)    Hemoglobin 16.3 (*)    HCT 48.0 (*)    All other components within normal limits  Rosezena SensorI-STAT TROPOININ, ED    Imaging Review Dg Chest Port 1 View  07/26/2015  CLINICAL DATA:  Palpitations EXAM: PORTABLE CHEST 1 VIEW COMPARISON:  04/26/2015 chest radiograph. FINDINGS: Stable cardiomediastinal silhouette with normal heart size. No pneumothorax. No pleural effusion. Lungs appear clear, with no acute consolidative airspace disease and no pulmonary edema. IMPRESSION: No active disease. Electronically Signed   By: Delbert PhenixJason A Poff M.D.   On: 07/26/2015 23:44   I have personally reviewed and evaluated these images and lab results as part of my medical decision-making.   EKG Interpretation #1   Date/Time:  Monday July 26 2015 22:21:55 EDT Ventricular Rate:  145 PR Interval:    QRS Duration: 88 QT Interval:  300 QTC Calculation: 466 R Axis:   73 Text Interpretation:  Supraventricular tachycardia Nonspecific ST  abnormality Abnormal ECG Confirmed by Fitzpatrick Alberico  MD, Asir Bingley ([id number]) on  07/26/2015 11:04:40 PM    EKG Interpretation #2  EKG Interpretation  Date/Time:  Monday July 26 2015 23:52:05 EDT Ventricular Rate:  75 PR Interval:  128 QRS Duration: 96 QT Interval:  374 QTC Calculation: 418 R Axis:   75 Text Interpretation:  Sinus rhythm Confirmed by Rebbeca Sheperd  MD, Demarr Kluever ([id number]) on 07/27/2015 12:15:32 AM       Medications  adenosine (ADENOCARD) 6 MG/2ML injection (not administered)  potassium chloride SA (K-DUR,KLOR-CON) CR tablet 40 mEq (not administered)  adenosine (ADENOCARD) 6 MG/2ML injection 12 mg (12 mg  Intravenous Given 07/26/15 2353)  0.9 %  sodium chloride infusion (500 mL/hr Intravenous New Bag/Given 07/26/15 2359)    MDM   Final diagnoses:  SVT (supraventricular tachycardia) (HCC)    Patient presents with symptoms consistent with prior episodes of SVT. She tried vagal maneuvers at home without success. She is otherwise hemodynamically stable. Vagal maneuvers attempted the bedside but unsuccessful. Patient was given one dose of 12 mg of adenosine with conversion to sinus rhythm. Repeat EKG is reassuring. Basic labwork shows mild hypokalemia. Patient was orally replaced and will be discharged with a short course of potassium and primary care follow-up for repeat lab work. Follow-up with Dr. SwazilandJordan given recurrent SVT.  After history, exam, and medical workup I feel the patient has been appropriately medically screened and is safe for discharge home. Pertinent diagnoses were discussed with the patient. Patient was given return precautions.     Shon Batonourtney F Jaskaran Dauzat, MD 07/27/15 [phone]

## 2015-07-26 NOTE — ED Notes (Signed)
Pt converted with 12mg  adenosine, no difficulty, HR 85,

## 2015-07-26 NOTE — ED Notes (Signed)
Pt  Wanting to waiting up front to check in baby to be seen as well.

## 2015-07-27 MED ORDER — POTASSIUM CHLORIDE CRYS ER 20 MEQ PO TBCR
40.0000 meq | EXTENDED_RELEASE_TABLET | Freq: Once | ORAL | Status: AC
  Administered 2015-07-27: 40 meq via ORAL
  Filled 2015-07-27: qty 2

## 2015-07-27 MED ORDER — POTASSIUM CHLORIDE CRYS ER 20 MEQ PO TBCR: 20.0000 meq | EXTENDED_RELEASE_TABLET | Freq: Every day | ORAL | Status: DC

## 2015-07-27 NOTE — ED Notes (Signed)
Patient given discharge instruction, verbalized understand. IV removed, band aid applied. Patient ambulatory out of the department.  

## 2015-07-27 NOTE — Discharge Instructions (Signed)
Paroxysmal Supraventricular Tachycardia  Paroxysmal supraventricular tachycardia (PSVT) is a type of abnormal heart rhythm. It causes your heart to beat very quickly and then suddenly stop beating so quickly. A normal heart rate is 60-100 beats per minute. During an episode of PSVT, your heart rate may be 150-250 beats per minute. This can make you feel light-headed and short of breath. An episode of PSVT can be frightening. It is usually not dangerous.  The heart has four chambers. All chambers need to work together for the heart to beat effectively. A normal heartbeat usually starts in the right upper chamber of the heart (atrium) when an area (sinoatrial node) puts out an electrical signal that spreads to the other chambers. People with PSVT may have abnormal electrical pathways, or they may have other areas in the upper chambers that send out electrical signals. The result is a very rapid heartbeat.  When your heart beats very quickly, it does not have time to fill completely with blood. When PSVT happens often or it lasts for long periods, it can lead to heart weakness and failure. Most people with PSVT do not have any other heart disease.  CAUSES  Abnormal electrical activity in the heart causes PSVT. It is not known why some people get PSVT and others do not.  RISK FACTORS  You may be more likely to have PSVT if:   You are 20-30 years old.   You are a woman.  Other factors that may increase your chances of an attack include:   Stress.   Being tired.   Smoking.   Stimulant drugs.   Alcoholic drinks.   Caffeine.   Pregnancy.  SIGNS AND SYMPTOMS  A mild episode of PSVT may cause no symptoms. If you do have signs and symptoms, they may include:   A pounding heart.   Feeling of skipped heartbeats (palpitations).   Weakness.   Shortness of breath.   Tightness or pain in your chest.   Light-headedness.   Anxiety.   Dizziness.   Sweating.   Nausea.   A fainting spell.  DIAGNOSIS  Your health care  provider may suspect PSVT if you have symptoms that come and go. The health care provider will do a physical exam. If you are having an episode during the exam, the health care provider may be able to diagnose PSVT by listening to your heart and feeling your pulse. Tests may also be done, including:   An electrical study of your heart (electrocardiogram, or ECG).   A test in which you wear a portable ECG monitor all day (Holter monitor) or for several days (event monitor).   A test that involves taking an image of your heart using sound waves (echocardiogram) to rule out other causes of a fast heart rate.  TREATMENT  You may not need treatment if episodes of PSVT do not happen often or if they do not cause symptoms. If PSVT episodes do cause symptoms, your health care provider may first suggest trying a self-treatment called vagus nerve stimulation. The vagus nerve extends down from the brain. It regulates certain body functions. Stimulating this nerve can slow down the heart. Your health care provider can teach you ways to do this. You may need to try a few ways to find what works best for you. Options include:   Holding your breath and pushing, as though you are having a bowel movement.   Massaging an area on one side of your neck below your jaw.     Medicines to prevent an attack.  Being treated in the hospital with medicine or electric shock to stop an attack (cardioversion). This treatment can include:  Getting medicine through an IV line.  Having a small electric shock delivered to your heart. You will be given medicine to make you sleep through this procedure.  If you have frequent episodes with symptoms, you may need a procedure to get rid of the faulty  areas of your heart (radiofrequency ablation) and end the episodes of PSVT. In this procedure:  A long, thin tube (catheter) is passed through one of your veins into your heart.  Energy directed through the catheter eliminates the areas of your heart that are causing abnormal electric stimulation. HOME CARE INSTRUCTIONS  Take medicines only as directed by your health care provider.  Do not use caffeine in any form if caffeine triggers episodes of PSVT. Otherwise, consume caffeine in moderation. This means no more than a few cups of coffee or the equivalent each day.  Do not drink alcohol if alcohol triggers episodes of PSVT. Otherwise, limit alcohol intake to no more than 1 drink per day for nonpregnant women and 2 drinks per day for men. One drink equals 12 ounces of beer, 5 ounces of wine, or 1 ounces of hard liquor.  Do not use any tobacco products, including cigarettes, chewing tobacco, or electronic cigarettes. If you need help quitting, ask your health care provider.  Try to get at least 7 hours of sleep each night.  Find healthy ways to manage stress.  Perform vagus nerve stimulation as directed by your health care provider.  Maintain a healthy weight.  Get some exercise on most days. Ask your health care provider to suggest some good activities for you. SEEK MEDICAL CARE IF:  You are having episodes of PSVT more often, or they are lasting longer.  Vagus nerve stimulation is no longer helping.  You have new symptoms during an episode. SEEK IMMEDIATE MEDICAL CARE IF:  You have chest pain or trouble breathing.  You have an episode of PSVT that has lasted longer than 20 minutes.  You have passed out from an episode of PSVT. These symptoms may represent a serious problem that is an emergency. Do not wait to see if the symptoms will go away. Get medical help right away. Call your local emergency services (911 in the [place] Do not drive yourself to the hospital.   This  information is not intended to replace advice given to you by your health care provider. Make sure you discuss any questions you have with your health care provider.   Document Released: 04/17/2005 Document Revised: 05/08/2014 Document Reviewed: 09/25/2013 Elsevier Interactive Patient Education 2016 [employer]. Hypokalemia Hypokalemia means that the amount of potassium in the blood is lower than normal.Potassium is a chemical, called an electrolyte, that helps regulate the amount of fluid in the body. It also stimulates muscle contraction and helps nerves function properly.Most of the body's potassium is inside of cells, and only a very small amount is in the blood. Because the amount in the blood is so small, minor changes can be life-threatening. CAUSES  Antibiotics.  Diarrhea or vomiting.  Using laxatives too much, which can cause diarrhea.  Chronic kidney disease.  Water pills (diuretics).  Eating disorders (bulimia).  Low magnesium level.  Sweating a lot. SIGNS AND SYMPTOMS  Weakness.  Constipation.  Fatigue.  Muscle cramps.  Mental confusion.  Skipped heartbeats or irregular heartbeat (palpitations).  Tingling or numbness. DIAGNOSIS  Your health care provider can diagnose hypokalemia with blood tests. In addition to checking your potassium level, your health care provider may also check other lab tests. TREATMENT Hypokalemia can be treated with potassium supplements taken by mouth or adjustments in your current medicines. If your potassium level is very low, you may need to get potassium through a vein (IV) and be monitored in the hospital. A diet high in potassium is also helpful. Foods high in potassium are:  Nuts, such as peanuts and pistachios.  Seeds, such as sunflower seeds and pumpkin seeds.  Peas, lentils, and lima beans.  Whole grain and bran cereals and breads.  Fresh fruit and vegetables, such as apricots, avocado, bananas, cantaloupe, kiwi,  oranges, tomatoes, asparagus, and potatoes.  Orange and tomato juices.  Red meats.  Fruit yogurt. HOME CARE INSTRUCTIONS  Take all medicines as prescribed by your health care provider.  Maintain a healthy diet by including nutritious food, such as fruits, vegetables, nuts, whole grains, and lean meats.  If you are taking a laxative, be sure to follow the directions on the label. SEEK MEDICAL CARE IF:  Your weakness gets worse.  You feel your heart pounding or racing.  You are vomiting or having diarrhea.  You are diabetic and having trouble keeping your blood glucose in the normal range. SEEK IMMEDIATE MEDICAL CARE IF:  You have chest pain, shortness of breath, or dizziness.  You are vomiting or having diarrhea for more than 2 days.  You faint. MAKE SURE YOU:   Understand these instructions.  Will watch your condition.  Will get help right away if you are not doing well or get worse.   This information is not intended to replace advice given to you by your health care provider. Make sure you discuss any questions you have with your health care provider.   Document Released: 04/17/2005 Document Revised: 05/08/2014 Document Reviewed: 10/18/2012 Elsevier Interactive Patient Education [employer]2016 Elsevier Inc.

## 2015-07-28 ENCOUNTER — Telehealth: Payer: Self-pay | Admitting: *Deleted

## 2015-07-28 MED ORDER — ATENOLOL 25 MG PO TABS: 25.0000 mg | ORAL_TABLET | Freq: Every day | ORAL | Status: DC

## 2015-07-28 NOTE — Telephone Encounter (Signed)
Spoke to patient advised she needs appointment with Dr.Jordan.[facility].Atenolol refill sent to pharmacy.

## 2015-09-22 ENCOUNTER — Telehealth: Payer: Self-pay | Admitting: Cardiology

## 2015-09-22 NOTE — Telephone Encounter (Signed)
FYI: [facility] is non-compliant with Atenolol -she hasn't filled since 07-29-15 for 30 day supply and not returning calls

## 2015-09-28 NOTE — Telephone Encounter (Signed)
Spoke to patient.She stated she had skipped taking Atenolol to see if it made a difference.Stated she had to restart Atenolol 25 mg daily.Follow up appointment scheduled with Dr.Jordan 01/28/16 at 3:30 pm.Advised to call sooner if needed.

## 2015-10-11 ENCOUNTER — Ambulatory Visit: Payer: Medicaid Other | Admitting: Cardiology

## 2015-10-11 ENCOUNTER — Encounter: Payer: Self-pay | Admitting: *Deleted

## 2016-01-17 ENCOUNTER — Encounter: Payer: Self-pay | Admitting: Cardiology

## 2016-01-27 NOTE — Progress Notes (Deleted)
Dorothy Meadows Date of Birth: 02/19/1986 Medical Record #[id number]  History of Present Illness: Dorothy Meadows is seen for follow up of SVT. She reports a history of tachycardia dating back 10 yrs. Her initial episode was treated in the ED and she was started on a medication which she later stopped. She has intermittent tachycardia since then that she is usually able to stop with Valsalva maneuvers. Sometimes it will last up to 2 hours. It makes her feel uncomfortable but no history of syncope or near syncope. No tachycardia during prior pregnancies. Over the past 4 months she has noted increased episodes. She presented to the ED on 11/22/13 but converted in the ambulance. On 12/23/13 she went to the ED with SVT rate 209 bpm. This was terminated with IV Adenosine. She was last seen in the ED in March 2017 with SVT rate 145. This converted with adenosine. She drinks 3 glasses of tea a day. She smokes 0.5 packs/day. No cocaine use.    No prior meds. Has hormone implant in place.  No Known Allergies  Past Medical History:  Diagnosis Date  . Genital warts   . Gestational diabetes    first pregnancy  . Gonorrhea    1st trimester this pregnancy  . Hyperlipidemia   . Hypertension   . No pertinent past medical history   . SVT (supraventricular tachycardia) (HCC)     Past Surgical History:  Procedure Laterality Date  . removal of warts      Social History   Social History  . Marital status: Single    Spouse name: N/A  . Number of children: 3  . Years of education: N/A   Social History Main Topics  . Smoking status: Current Every Day Smoker    Packs/day: 0.50    Years: 15.00    Types: Cigarettes  . Smokeless tobacco: Never Used  . Alcohol use Yes     Comment: occasionally  . Drug use:     Types: Marijuana     Comment: occ  . Sexual activity: Yes    Birth control/ protection: None, Implant   Other Topics Concern  . Not on file   Social History Narrative  . No narrative on  file    Family History  Problem Relation Age of Onset  . Diabetes Mother   . Hypertension Mother   . Depression Mother   . Anxiety disorder Mother   . Diabetes Maternal Grandmother   . Hypertension Maternal Grandmother     Review of Systems: As noted in HPI.  All other systems were reviewed and are negative.  Physical Exam: There were no vitals taken for this visit. There were no vitals filed for this visit.GENERAL:  Well appearing, obese BF in NAD HEENT:  PERRL, EOMI, sclera are clear. Oropharynx is clear. NECK:  No jugular venous distention, carotid upstroke brisk and symmetric, no bruits, no thyromegaly or adenopathy LUNGS:  Clear to auscultation bilaterally CHEST:  Unremarkable HEART:  RRR,  PMI not displaced or sustained,S1 and S2 within normal limits, no S3, no S4: no clicks, no rubs, no murmurs ABD:  Soft, nontender. BS +, no masses or bruits. No hepatomegaly, no splenomegaly EXT:  2 + pulses throughout, no edema, no cyanosis no clubbing SKIN:  Warm and dry.  No rashes NEURO:  Alert and oriented x 3. Cranial nerves II through XII intact. PSYCH:  Cognitively intact    LABORATORY DATA: Lab Results  Component Value Date   WBC 13.3 (H)  09/08/2014   HGB 16.3 (H) 07/26/2015   HCT 48.0 (H) 07/26/2015   PLT 208 09/08/2014   GLUCOSE 92 07/26/2015   ALT 15 09/08/2014   AST 19 09/08/2014   NA 144 07/26/2015   K 3.1 (L) 07/26/2015   CL 104 07/26/2015   CREATININE 0.90 07/26/2015   BUN 7 07/26/2015   CO2 24 09/08/2014   TSH 0.012 (L) 02/18/2014     Assessment / Plan: 1. 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 Recommend avoidance of stimulants including tobacco and caffeine. Will check TSH. Reviewed Valsalva maneuvers. Will start Atenolol 25 mg daily. I will follow up in 3 months. If she has breakthrough despite medical therapy could consider ablation.

## 2016-01-28 ENCOUNTER — Encounter: Payer: Self-pay | Admitting: *Deleted

## 2016-01-28 ENCOUNTER — Ambulatory Visit: Payer: Medicaid Other | Admitting: Cardiology

## 2016-02-28 ENCOUNTER — Inpatient Hospital Stay (HOSPITAL_COMMUNITY)
Admission: AD | Admit: 2016-02-28 | Discharge: 2016-02-28 | Disposition: A | Discharge status: Home / Self Care | Payer: Medicaid Other | Source: Ambulatory Visit | Attending: Family Medicine | Admitting: Family Medicine

## 2016-02-28 DIAGNOSIS — Z308 Encounter for other contraceptive management: Secondary | ICD-10-CM | POA: Diagnosis not present

## 2016-02-28 DIAGNOSIS — Z975 Presence of (intrauterine) contraceptive device: Secondary | ICD-10-CM

## 2016-02-28 NOTE — Discharge Instructions (Signed)
Patient instructed to call the [facility] to schedule an appointment for removal.

## 2016-02-28 NOTE — MAU Provider Note (Signed)
S:  [name] is a 30 y.o. female [phone] non-pregnant female here in MAU for nexplanon removal. She is with out complaints. She had the nexplanon placed 3 years ago in the [facility] and would like it out today.   O:  GENERAL: Well-developed, well-nourished female in no acute distress.  LUNGS: Effort normal SKIN: Warm, dry and without erythema PSYCH: Normal mood and affect  Vitals:   02/28/16 1353  BP: 132/82  Pulse: 72  Resp: 18  Temp: 98.4 F (36.9 C)      A:  1. Nexplanon in place     P:  Discharge home in stable condition Patient encouraged to call the [facility] to schedule an appointment for removal of Nexplanon.  Return to MAU for emergencies only.   Duane LopeJennifer I Rasch, NP 02/28/2016 3:23 PM

## 2016-03-27 ENCOUNTER — Ambulatory Visit (INDEPENDENT_AMBULATORY_CARE_PROVIDER_SITE_OTHER): Payer: Medicaid Other | Admitting: Family Medicine

## 2016-03-27 ENCOUNTER — Encounter: Payer: Self-pay | Admitting: Family Medicine

## 2016-03-27 VITALS — BP 137/89 | HR 70 | Temp 97.5°F | Ht 69.0 in | Wt 245.1 lb

## 2016-03-27 DIAGNOSIS — I471 Supraventricular tachycardia: Secondary | ICD-10-CM | POA: Diagnosis not present

## 2016-03-27 DIAGNOSIS — Z3009 Encounter for other general counseling and advice on contraception: Secondary | ICD-10-CM | POA: Diagnosis not present

## 2016-03-27 DIAGNOSIS — K219 Gastro-esophageal reflux disease without esophagitis: Secondary | ICD-10-CM | POA: Diagnosis not present

## 2016-03-27 DIAGNOSIS — M7521 Bicipital tendinitis, right shoulder: Secondary | ICD-10-CM

## 2016-03-27 LAB — PREGNANCY, URINE: Preg Test, Ur: NEGATIVE

## 2016-03-27 MED ORDER — PREDNISONE 20 MG PO TABS: ORAL_TABLET | ORAL | 0 refills | Status: DC

## 2016-03-27 MED ORDER — ATENOLOL 25 MG PO TABS: 25.0000 mg | ORAL_TABLET | Freq: Every day | ORAL | 2 refills | Status: DC

## 2016-03-27 MED ORDER — OMEPRAZOLE 20 MG PO CPDR: 20.0000 mg | DELAYED_RELEASE_CAPSULE | Freq: Every day | ORAL | 2 refills | Status: DC | PRN

## 2016-03-27 NOTE — Progress Notes (Signed)
BP 137/89   Pulse 70   Temp 97.5 F (36.4 C) (Oral)   Ht 5\' 9"  (1.753 m)   Wt 245 lb 2 oz (111.2 kg)   LMP 02/01/2016   BMI 36.20 kg/m    Subjective:    Patient ID: Dorothy Meadows, female    DOB: 02-13-1986, 30 y.o.   MRN: [id number]  HPI: Dorothy Meadows is a 30 y.o. female presenting on 03/27/2016 for Establish Care; Cough (chest congestion, x 1 week); and Back Pain   HPI Acid reflux and cough Patient is coming in for a cough that is been going on and increasing over the past few months. Cough is dry and nonproductive. She denies any nasal congestion or sinus symptoms associated with it. She denies any shortness of breath or wheezing. She is a new patient  Right shoulder pain Patient has been having right anterior shoulder pain that has been going on intermittently for the past few months. She cannot recall any specific incident where she injured it. She has not been using anything to help with that just yet because she did not know what was wrong with it. She did go to the [facility] emergency department a week or 2 ago and was told that she had arthritis in her shoulder and to establish with a primary care doctor to discuss this more. She denies any numbness or weakness or loss of range of motion in that shoulder. She denies any neck pain on that side.  Birth control discussion Patient has been on nexplanon for 5 or 6 years which is past the expiration date of the Nexplanon. She says she's been having sporadic periods since she has come down off the medicine. She is not currently sexually active and has not been for the past few months.  SVT Patient is currently on atenolol and has been the past 2 years because of SVT that was diagnosed by cardiology. She says she has not had any issues since she's been on atenolol and has been controlled.  Relevant past medical, surgical, family and social history reviewed and updated as indicated. Interim medical history since our  last visit reviewed. Allergies and medications reviewed and updated.  Review of Systems  Constitutional: Negative for chills and fever.  HENT: Negative for congestion, ear discharge and ear pain.   Eyes: Negative for redness and visual disturbance.  Respiratory: Negative for chest tightness and shortness of breath.   Cardiovascular: Negative for chest pain and leg swelling.  Genitourinary: Negative for difficulty urinating and dysuria.  Musculoskeletal: Positive for arthralgias. Negative for back pain, gait problem, joint swelling and myalgias.  Skin: Negative for color change and rash.  Neurological: Negative for light-headedness and headaches.  Psychiatric/Behavioral: Negative for agitation and behavioral problems.  All other systems reviewed and are negative.   Per HPI unless specifically indicated above  Social History   Social History  . Marital status: Single    Spouse name: N/A  . Number of children: 3  . Years of education: N/A   Occupational History  . Not on file.   Social History Main Topics  . Smoking status: Current Every Day Smoker    Packs/day: 0.50    Years: 15.00    Types: Cigarettes  . Smokeless tobacco: Never Used  . Alcohol use Yes     Comment: occasionally, birthdays and get togethers  . Drug use:     Types: Marijuana     Comment: occ daily 1-2   .  Sexual activity: Yes    Birth control/ protection: None, Implant     Comment: boyfriend since 2011   Other Topics Concern  . Not on file   Social History Narrative  . No narrative on file    Past Surgical History:  Procedure Laterality Date  . removal of warts      Family History  Problem Relation Age of Onset  . Diabetes Mother   . Hypertension Mother   . Depression Mother   . Anxiety disorder Mother   . Diabetes Maternal Grandmother   . Hypertension Maternal Grandmother   . Heart disease Maternal Grandmother     [id number]      Medication List       Accurate as of 03/27/16  9:43 AM.  Always use your most recent med list.          atenolol 25 MG tablet Commonly known as:  TENORMIN Take 1 tablet (25 mg total) by mouth daily.   ibuprofen 600 MG tablet Commonly known as:  ADVIL,MOTRIN Take 600 mg by mouth every 6 (six) hours as needed.   MULTIVITAMIN GUMMIES WOMENS PO Take by mouth.   NEXPLANON 68 MG Impl implant Generic drug:  etonogestrel 1 each by Subdermal route once.   omeprazole 20 MG capsule Commonly known as:  PRILOSEC Take 1 capsule (20 mg total) by mouth daily as needed (for acid reflux).   predniSONE 20 MG tablet Commonly known as:  DELTASONE 2 po at same time daily for 5 days          Objective:    BP 137/89   Pulse 70   Temp 97.5 F (36.4 C) (Oral)   Ht 5\' 9"  (1.753 m)   Wt 245 lb 2 oz (111.2 kg)   LMP 02/01/2016   BMI 36.20 kg/m   Wt Readings from Last 3 Encounters:  03/27/16 245 lb 2 oz (111.2 kg)  02/28/16 246 lb 12.8 oz (111.9 kg)  07/26/15 260 lb (117.9 kg)    Physical Exam  Constitutional: She is oriented to person, place, and time. She appears well-developed and well-nourished. No distress.  Eyes: Conjunctivae are normal.  Cardiovascular: Normal rate, regular rhythm, normal heart sounds and intact distal pulses.   No murmur heard. Pulmonary/Chest: Effort normal and breath sounds normal. No respiratory distress. She has no wheezes. She has no rales.  Abdominal: Soft. Bowel sounds are normal. She exhibits no distension. There is no tenderness. There is no rebound.  Musculoskeletal: Normal range of motion. She exhibits no edema.       Right shoulder: She exhibits tenderness (Anterior tenderness over bicipital tendon). She exhibits normal range of motion, no swelling and no deformity.  Neurological: She is alert and oriented to person, place, and time. Coordination normal.  Skin: Skin is warm and dry. No rash noted. She is not diaphoretic.  Psychiatric: She has a normal mood and affect. Her behavior is normal.  Nursing note  and vitals reviewed.     Assessment & Plan:   Problem List Items Addressed This Visit      Cardiovascular and Mediastinum   SVT (supraventricular tachycardia) (HCC)   Relevant Medications   atenolol (TENORMIN) 25 MG tablet     Digestive   GERD (gastroesophageal reflux disease)   Relevant Medications   omeprazole (PRILOSEC) 20 MG capsule    Other Visit Diagnoses    Biceps tendinitis of right upper extremity    -  Primary   Relevant Medications   predniSONE (DELTASONE)  20 MG tablet   Birth control counseling       Relevant Orders   Pregnancy, urine       Follow up plan: Return in about 2 weeks (around 04/10/2016), or if symptoms worsen or fail to improve, for nexplanon.  Arville CareJoshua Jandiel Magallanes, MD [facility] 03/27/2016, 9:43 AM

## 2016-04-12 ENCOUNTER — Ambulatory Visit: Payer: Medicaid Other | Admitting: Family Medicine

## 2016-04-12 ENCOUNTER — Encounter: Payer: Self-pay | Admitting: Family Medicine

## 2016-04-12 ENCOUNTER — Ambulatory Visit (INDEPENDENT_AMBULATORY_CARE_PROVIDER_SITE_OTHER): Payer: Medicaid Other | Admitting: Family Medicine

## 2016-04-12 VITALS — BP 128/80 | HR 56 | Temp 98.1°F | Ht 69.0 in | Wt 249.2 lb

## 2016-04-12 DIAGNOSIS — Z3046 Encounter for surveillance of implantable subdermal contraceptive: Secondary | ICD-10-CM

## 2016-04-12 DIAGNOSIS — Z30017 Encounter for initial prescription of implantable subdermal contraceptive: Secondary | ICD-10-CM

## 2016-04-12 LAB — PREGNANCY, URINE: PREG TEST UR: NEGATIVE

## 2016-04-12 NOTE — Progress Notes (Signed)
  Nexplanon removal/insertion: Patient educated on Nexplanon birth control and its side effects and the side effects from the procedure. Patient wishes to continue. Left inner arm prepped with Betadine. 3 mL of 2% lidocaine without epinephrine used for anesthesia. 0.2 cm incision was made with a 15 blade until tip of previous device was found and grasped with clamp and removed without any complications. Removed intact and complete. Device was prepped using sterile procedure. Nexplanon was inserted using package and Company directions in the same opening where the previous one had been removed. Steri-Strips were applied. Bleeding was minimal and patient tolerated procedure well. Pressure dressing was placed.

## 2016-06-01 NOTE — Progress Notes (Deleted)
Cardiology Office Note    Date:  06/01/2016   ID:  Dorothy Meadows, DOB [birth year], MRN [id number]  PCP:  Nils Pyle, MD  Cardiologist:  Dr. Swaziland  No chief complaint on file.   History of Present Illness:    Dorothy Meadows is a 31 y.o. female with past medical history of pSVT who presents to the office today for ***   Was last seen by Dr. Swaziland in 01/2014 and reported intermittent episodes of tachycardia for the past 8 years, usually able to be terminated with Valsalva maneuvers. It was recommended to avoid the use of stimulants and start Atenolol 25mg  daily. TSH was abnormally low at 0.012 and it was recommended she follow-up with her PCP in regards to this and see Dr. Swaziland back in 3 months.     Past Medical History:  Diagnosis Date  . Genital warts   . Gestational diabetes    first pregnancy  . Gonorrhea    1st trimester this pregnancy  . Hyperlipidemia   . Hypertension   . No pertinent past medical history   . SVT (supraventricular tachycardia) (HCC)     Past Surgical History:  Procedure Laterality Date  . removal of warts      Current Medications: Outpatient Medications Prior to Visit  Medication Sig Dispense Refill  . atenolol (TENORMIN) 25 MG tablet Take 1 tablet (25 mg total) by mouth daily. 90 tablet 2  . etonogestrel (NEXPLANON) 68 MG IMPL implant 1 each by Subdermal route once. Inserted on 04/12/2016    . ibuprofen (ADVIL,MOTRIN) 600 MG tablet Take 600 mg by mouth every 6 (six) hours as needed.    . Multiple Vitamins-Minerals (MULTIVITAMIN GUMMIES WOMENS PO) Take by mouth.    Marland Kitchen omeprazole (PRILOSEC) 20 MG capsule Take 1 capsule (20 mg total) by mouth daily as needed (for acid reflux). 90 capsule 2   No facility-administered medications prior to visit.      Allergies:   Patient has no known allergies.   Social History   Social History  . Marital status: Single    Spouse name: N/A  . Number of children: 3  . Years of education: N/A     Social History Main Topics  . Smoking status: Current Every Day Smoker    Packs/day: 0.50    Years: 15.00    Types: Cigarettes  . Smokeless tobacco: Never Used  . Alcohol use Yes     Comment: occasionally, birthdays and get togethers  . Drug use: Yes    Types: Marijuana     Comment: occ daily 1-2   . Sexual activity: Yes    Birth control/ protection: None, Implant     Comment: boyfriend since 2011   Other Topics Concern  . Not on file   Social History Narrative  . No narrative on file     Family History:  The patient's ***family history includes Anxiety disorder in her mother; Depression in her mother; Diabetes in her maternal grandmother and mother; Heart disease in her maternal grandmother; Hypertension in her maternal grandmother and mother.   Review of Systems:   Please see the history of present illness.     General:  No chills, fever, night sweats or weight changes.  Cardiovascular:  No chest pain, dyspnea on exertion, edema, orthopnea, palpitations, paroxysmal nocturnal dyspnea. Dermatological: No rash, lesions/masses Respiratory: No cough, dyspnea Urologic: No hematuria, dysuria Abdominal:   No nausea, vomiting, diarrhea, bright red blood per rectum, melena, or hematemesis  Neurologic:  No visual changes, wkns, changes in mental status. All other systems reviewed and are otherwise negative except as noted above.   Physical Exam:    VS:  There were no vitals taken for this visit.   General: Well developed, well nourished,female appearing in no acute distress. Head: Normocephalic, atraumatic, sclera non-icteric, no xanthomas, nares are without discharge.  Neck: No carotid bruits. JVD not elevated.  Lungs: Respirations regular and unlabored, without wheezes or rales.  Heart: ***Regular rate and rhythm. No S3 or S4.  No murmur, no rubs, or gallops appreciated. Abdomen: Soft, non-tender, non-distended with normoactive bowel sounds. No hepatomegaly. No  rebound/guarding. No obvious abdominal masses. Msk:  Strength and tone appear normal for age. No joint deformities or effusions. Extremities: No clubbing or cyanosis. No edema.  Distal pedal pulses are 2+ bilaterally. Neuro: Alert and oriented X 3. Moves all extremities spontaneously. No focal deficits noted. Psych:  Responds to questions appropriately with a normal affect. Skin: No rashes or lesions noted  Wt Readings from Last 3 Encounters:  04/12/16 249 lb 4 oz (113.1 kg)  03/27/16 245 lb 2 oz (111.2 kg)  02/28/16 246 lb 12.8 oz (111.9 kg)        Studies/Labs Reviewed:   EKG:  EKG is*** ordered today.  The ekg ordered today demonstrates ***  Recent Labs: 07/26/2015: BUN 7; Creatinine, Ser 0.90; Hemoglobin 16.3; Potassium 3.1; Sodium 144   Lipid Panel No results found for: CHOL, TRIG, HDL, CHOLHDL, VLDL, LDLCALC, LDLDIRECT  Additional studies/ records that were reviewed today include:  ***  Assessment:    No diagnosis found.   Plan:   In order of problems listed above:  1. ***    Medication Adjustments/Labs and Tests Ordered: Current medicines are reviewed at length with the patient today.  Concerns regarding medicines are outlined above.  Medication changes, Labs and Tests ordered today are listed in the Patient Instructions below. There are no Patient Instructions on file for this visit.   Lorri FrederickSigned, Crystal Scarberry M Hawthorne Day, [place]  06/01/2016 9:17 PM    [facility] [address], [address] [place], [place]  [address] Phone: [phone]; Fax: [phone]  [address], [address] [place], [place] [address] Phone: [phone]

## 2016-06-02 ENCOUNTER — Ambulatory Visit: Payer: Medicaid Other | Admitting: Student

## 2016-07-25 ENCOUNTER — Emergency Department (HOSPITAL_COMMUNITY)
Admission: EM | Admit: 2016-07-25 | Discharge: 2016-07-25 | Disposition: A | Discharge status: Home / Self Care | Payer: Medicaid Other | Attending: Emergency Medicine | Admitting: Emergency Medicine

## 2016-07-25 DIAGNOSIS — M79605 Pain in left leg: Secondary | ICD-10-CM | POA: Diagnosis present

## 2016-07-25 DIAGNOSIS — I10 Essential (primary) hypertension: Secondary | ICD-10-CM | POA: Insufficient documentation

## 2016-07-25 DIAGNOSIS — F17210 Nicotine dependence, cigarettes, uncomplicated: Secondary | ICD-10-CM | POA: Insufficient documentation

## 2016-07-25 DIAGNOSIS — Z79899 Other long term (current) drug therapy: Secondary | ICD-10-CM | POA: Insufficient documentation

## 2016-07-25 DIAGNOSIS — M5432 Sciatica, left side: Secondary | ICD-10-CM | POA: Diagnosis not present

## 2016-07-25 MED ORDER — OXYCODONE-ACETAMINOPHEN 5-325 MG PO TABS
ORAL_TABLET | ORAL | Status: AC
  Filled 2016-07-25: qty 1

## 2016-07-25 MED ORDER — METHOCARBAMOL 500 MG PO TABS: 500.0000 mg | ORAL_TABLET | Freq: Two times a day (BID) | ORAL | 0 refills | Status: DC

## 2016-07-25 MED ORDER — KETOROLAC TROMETHAMINE 60 MG/2ML IM SOLN
60.0000 mg | Freq: Once | INTRAMUSCULAR | Status: AC
  Administered 2016-07-25: 60 mg via INTRAMUSCULAR
  Filled 2016-07-25: qty 2

## 2016-07-25 MED ORDER — OXYCODONE-ACETAMINOPHEN 5-325 MG PO TABS
1.0000 | ORAL_TABLET | ORAL | Status: DC | PRN
  Administered 2016-07-25: 1 via ORAL

## 2016-07-25 MED ORDER — PREDNISONE 20 MG PO TABS: ORAL_TABLET | ORAL | 0 refills | Status: DC

## 2016-07-25 NOTE — ED Provider Notes (Signed)
[facility]-EMERGENCY DEPT Provider Note   CSN: [id number] Arrival date & time: 07/25/16  1621   By signing my name below, I, Clovis Pu, attest that this documentation has been prepared under the direction and in the presence of  Felicie Morn, NP. Electronically Signed: Clovis Pu, ED Scribe. 07/25/16. 7:27 PM.   History   Chief Complaint Chief Complaint  Patient presents with  . Leg Pain    Left Leg, hx of sciatica    HPI Comments:  Dorothy Meadows is a 31 y.o. female, with a PMHx of sciatica, who presents to the Emergency Department complaining of acute onset, intermittent, sharp left buttock and hip pain which radiates to her left thigh x several days. Pt notes a hx of similar symptoms due to sciatica but notes her pain was recently aggravated due to heavy lifting from moving. She states her pain has previously been treated with a steroid, muscle relaxer and percocet. No alleviating factors noted. Pt denies saddle anesthesia, bowel/bladder incontinence or any other associated symptoms. No PCP per pt.    The history is provided by the patient. No language interpreter was used.  Leg Pain   This is a new problem. The current episode started yesterday. The pain is present in the left hip and left upper leg. The pain is moderate. Pertinent negatives include no numbness and no tingling. She has tried nothing for the symptoms. There has been no history of extremity trauma.    Past Medical History:  Diagnosis Date  . Genital warts   . Gestational diabetes    first pregnancy  . Gonorrhea    1st trimester this pregnancy  . Hyperlipidemia   . Hypertension   . No pertinent past medical history   . SVT (supraventricular tachycardia) [facility])     Patient Active Problem List   Diagnosis Date Noted  . SVT (supraventricular tachycardia) (HCC) 02/18/2014  . Cigarette smoker one half pack a day or less 12/19/2012  . GERD (gastroesophageal reflux disease) 11/14/2012    Past Surgical History:    Procedure Laterality Date  . removal of warts      OB History    Gravida Para Term Preterm AB Living   4 3 3  0 1 3   SAB TAB Ectopic Multiple Live Births   1 0 0 0 3       Home Medications    Prior to Admission medications   Medication Sig Start Date End Date Taking? Authorizing Provider  atenolol (TENORMIN) 25 MG tablet Take 1 tablet (25 mg total) by mouth daily. 03/27/16   Elige Radon Dettinger, MD  etonogestrel (NEXPLANON) 68 MG IMPL implant 1 each by Subdermal route once. Inserted on 04/12/2016    Historical Provider, MD  ibuprofen (ADVIL,MOTRIN) 600 MG tablet Take 600 mg by mouth every 6 (six) hours as needed.    Historical Provider, MD  methocarbamol (ROBAXIN) 500 MG tablet Take 1 tablet (500 mg total) by mouth 2 (two) times daily. 07/25/16   Felicie Morn, NP  Multiple Vitamins-Minerals (MULTIVITAMIN GUMMIES WOMENS PO) Take by mouth.    Historical Provider, MD  omeprazole (PRILOSEC) 20 MG capsule Take 1 capsule (20 mg total) by mouth daily as needed (for acid reflux). 03/27/16   Elige Radon Dettinger, MD  predniSONE (DELTASONE) 20 MG tablet 3 tabs po day one, then 2 tabs daily x 4 days 07/25/16   Felicie Morn, NP    Family History Family History  Problem Relation Age of Onset  . Diabetes Mother   .  Hypertension Mother   . Depression Mother   . Anxiety disorder Mother   . Diabetes Maternal Grandmother   . Hypertension Maternal Grandmother   . Heart disease Maternal Grandmother     2070s    Social History Social History  Substance Use Topics  . Smoking status: Current Every Day Smoker    Packs/day: 0.50    Years: 15.00    Types: Cigarettes  . Smokeless tobacco: Never Used  . Alcohol use Yes     Comment: occasionally, birthdays and get togethers     Allergies   Patient has no known allergies.   Review of Systems Review of Systems  Musculoskeletal: Positive for myalgias.  Neurological: Negative for tingling and numbness.  All other systems reviewed and are  negative.    Physical Exam Updated Vital Signs BP (!) 150/107 (BP Location: Right Arm)   Pulse 92   Temp 99 F (37.2 C) (Oral)   Resp 16   Ht 5\' 9"  (1.753 m)   Wt 240 lb (108.9 kg)   SpO2 100%   BMI 35.44 kg/m   Physical Exam  Constitutional: She is oriented to person, place, and time. She appears well-developed and well-nourished. No distress.  HENT:  Head: Normocephalic and atraumatic.  Eyes: EOM are normal.  Neck: Normal range of motion.  Cardiovascular: Normal rate, regular rhythm and normal heart sounds.   Pulmonary/Chest: Effort normal and breath sounds normal.  Abdominal: Soft. She exhibits no distension. There is no tenderness.  Musculoskeletal: Normal range of motion.  Sharp left buttock and hip pain that radiates to left thigh.   Neurological: She is alert and oriented to person, place, and time.  Skin: Skin is warm and dry.  Psychiatric: She has a normal mood and affect. Judgment normal.  Nursing note and vitals reviewed.    ED Treatments / Results  DIAGNOSTIC STUDIES:  Oxygen Saturation is 100% on RA, normal by my interpretation.    COORDINATION OF CARE:  7:10 PM Discussed treatment plan with pt at bedside and pt agreed to plan.  Labs (all labs ordered are listed, but only abnormal results are displayed) Labs Reviewed - No data to display  EKG  EKG Interpretation None       Radiology No results found.  Procedures Procedures (including critical care time)  Medications Ordered in ED Medications  oxyCODONE-acetaminophen (PERCOCET/ROXICET) 5-325 MG per tablet 1 tablet (1 tablet Oral Given 07/25/16 1709)  ketorolac (TORADOL) injection 60 mg (not administered)     Initial Impression / Assessment and Plan / ED Course  I have reviewed the triage vital signs and the nursing notes.  Pertinent labs & imaging results that were available during my care of the patient were reviewed by me and considered in my medical decision making (see chart for  details).     Patient with back pain.  No neurological deficits and normal neuro exam.  Patient is ambulatory.  No loss of bowel or bladder control.  No concern for cauda equina.  No fever, night sweats, weight loss, h/o cancer, IVDA, no recent procedure to back. No urinary symptoms suggestive of UTI.  Supportive care and return precaution discussed. Appears safe for discharge at this time. Follow up as indicated in discharge paperwork.   Final Clinical Impressions(s) / ED Diagnoses   Final diagnoses:  Sciatica of left side    New Prescriptions New Prescriptions   METHOCARBAMOL (ROBAXIN) 500 MG TABLET    Take 1 tablet (500 mg total) by mouth 2 (  two) times daily.   PREDNISONE (DELTASONE) 20 MG TABLET    3 tabs po day one, then 2 tabs daily x 4 days    I personally performed the services described in this documentation, which was scribed in my presence. The recorded information has been reviewed and is accurate.   Felicie Morn, NP 07/25/16 2031    Canary Brim Tegeler, MD 07/26/16 1013

## 2016-07-25 NOTE — ED Triage Notes (Signed)
Pt presents with increasing stabbing pain to left leg. Hx of sciatica

## 2016-07-25 NOTE — ED Triage Notes (Signed)
Patient has had muscle relaxer and steroid in past, w/ percocet. This has worked well in past.

## 2017-04-16 ENCOUNTER — Encounter (HOSPITAL_COMMUNITY): Payer: Self-pay | Admitting: Emergency Medicine

## 2017-04-16 DIAGNOSIS — S29012A Strain of muscle and tendon of back wall of thorax, initial encounter: Secondary | ICD-10-CM | POA: Diagnosis not present

## 2017-04-16 DIAGNOSIS — X500XXA Overexertion from strenuous movement or load, initial encounter: Secondary | ICD-10-CM | POA: Diagnosis not present

## 2017-04-16 DIAGNOSIS — Y9259 Other trade areas as the place of occurrence of the external cause: Secondary | ICD-10-CM | POA: Diagnosis not present

## 2017-04-16 DIAGNOSIS — F17210 Nicotine dependence, cigarettes, uncomplicated: Secondary | ICD-10-CM | POA: Diagnosis not present

## 2017-04-16 DIAGNOSIS — I10 Essential (primary) hypertension: Secondary | ICD-10-CM | POA: Diagnosis not present

## 2017-04-16 DIAGNOSIS — S299XXA Unspecified injury of thorax, initial encounter: Secondary | ICD-10-CM | POA: Diagnosis present

## 2017-04-16 DIAGNOSIS — Y990 Civilian activity done for income or pay: Secondary | ICD-10-CM | POA: Insufficient documentation

## 2017-04-16 DIAGNOSIS — Z79899 Other long term (current) drug therapy: Secondary | ICD-10-CM | POA: Diagnosis not present

## 2017-04-16 DIAGNOSIS — Y9389 Activity, other specified: Secondary | ICD-10-CM | POA: Diagnosis not present

## 2017-04-16 NOTE — ED Triage Notes (Signed)
Pt to ED c/o R shoulder pain onset earlier today - states she was lifting heavy boxes filled with shingles and felt the pain gradually come on. Full ROM, CSM intact.

## 2017-04-17 ENCOUNTER — Emergency Department (HOSPITAL_COMMUNITY)
Admission: EM | Admit: 2017-04-17 | Discharge: 2017-04-17 | Disposition: A | Discharge status: Home / Self Care | Payer: Medicaid Other | Attending: Emergency Medicine | Admitting: Emergency Medicine

## 2017-04-17 DIAGNOSIS — S29012A Strain of muscle and tendon of back wall of thorax, initial encounter: Secondary | ICD-10-CM

## 2017-04-17 MED ORDER — LIDOCAINE 5 % EX PTCH: 1.0000 | MEDICATED_PATCH | CUTANEOUS | 0 refills | Status: DC

## 2017-04-17 MED ORDER — NAPROXEN 250 MG PO TABS
500.0000 mg | ORAL_TABLET | Freq: Once | ORAL | Status: AC
  Administered 2017-04-17: 500 mg via ORAL
  Filled 2017-04-17: qty 2

## 2017-04-17 MED ORDER — METHOCARBAMOL 500 MG PO TABS: 500.0000 mg | ORAL_TABLET | Freq: Two times a day (BID) | ORAL | 0 refills | Status: DC

## 2017-04-17 MED ORDER — NAPROXEN 500 MG PO TABS: 500.0000 mg | ORAL_TABLET | Freq: Two times a day (BID) | ORAL | 0 refills | Status: DC

## 2017-04-17 MED ORDER — HYDROCODONE-ACETAMINOPHEN 5-325 MG PO TABS
1.0000 | ORAL_TABLET | Freq: Once | ORAL | Status: AC
  Administered 2017-04-17: 1 via ORAL
  Filled 2017-04-17: qty 1

## 2017-04-17 NOTE — ED Provider Notes (Signed)
[facility] EMERGENCY DEPARTMENT Provider Note   CSN: [id number] Arrival date & time: 04/16/17  2232     History   Chief Complaint Chief Complaint  Patient presents with  . Shoulder Pain    HPI Dorothy Meadows is a 31 y.o. female.  31 year old female presents to the emergency department for right posterior shoulder/thoracic back pain.  She states that symptoms began after lifting heavy boxes filled with shingles while at work.  She states that pain has been gradual and constant.  It was unrelieved with 2 tablets of 500 mg Tylenol.  The patient has not had any inability to move her right arm.  No numbness, paresthesias, extremity weakness.  She denies any falls, direct trauma, direct injury.      Past Medical History:  Diagnosis Date  . Genital warts   . Gestational diabetes    first pregnancy  . Gonorrhea    1st trimester this pregnancy  . Hyperlipidemia   . Hypertension   . No pertinent past medical history   . SVT (supraventricular tachycardia) [facility])     Patient Active Problem List   Diagnosis Date Noted  . SVT (supraventricular tachycardia) (HCC) 02/18/2014  . Cigarette smoker one half pack a day or less 12/19/2012  . GERD (gastroesophageal reflux disease) 11/14/2012    Past Surgical History:  Procedure Laterality Date  . removal of warts      OB History    Gravida Para Term Preterm AB Living   4 3 3  0 1 3   SAB TAB Ectopic Multiple Live Births   1 0 0 0 3       Home Medications    Prior to Admission medications   Medication Sig Start Date End Date Taking? Authorizing Provider  atenolol (TENORMIN) 25 MG tablet Take 1 tablet (25 mg total) by mouth daily. 03/27/16   Dettinger, Elige Radon, MD  etonogestrel (NEXPLANON) 68 MG IMPL implant 1 each by Subdermal route once. Inserted on 04/12/2016    [provider]  lidocaine (LIDODERM) 5 % Place 1 patch onto the skin daily. Remove & Discard patch within 12 hours or as directed by  MD 04/17/17   Antony Madura, PA-C  methocarbamol (ROBAXIN) 500 MG tablet Take 1 tablet (500 mg total) by mouth 2 (two) times daily. 04/17/17   Antony Madura, PA-C  Multiple Vitamins-Minerals (MULTIVITAMIN GUMMIES WOMENS PO) Take by mouth.    [provider]  naproxen (NAPROSYN) 500 MG tablet Take 1 tablet (500 mg total) by mouth 2 (two) times daily. 04/17/17   Antony Madura, PA-C  omeprazole (PRILOSEC) 20 MG capsule Take 1 capsule (20 mg total) by mouth daily as needed (for acid reflux). 03/27/16   Dettinger, Elige Radon, MD  predniSONE (DELTASONE) 20 MG tablet 3 tabs po day one, then 2 tabs daily x 4 days 07/25/16   Felicie Morn, NP    Family History Family History  Problem Relation Age of Onset  . Diabetes Mother   . Hypertension Mother   . Depression Mother   . Anxiety disorder Mother   . Diabetes Maternal Grandmother   . Hypertension Maternal Grandmother   . Heart disease Maternal Grandmother        82s    Social History Social History   Tobacco Use  . Smoking status: Current Every Day Smoker    Packs/day: 0.50    Years: 15.00    Pack years: 7.50    Types: Cigarettes  . Smokeless tobacco: Never  Used  Substance Use Topics  . Alcohol use: Yes    Comment: occasionally, birthdays and get togethers  . Drug use: Yes    Types: Marijuana    Comment: occ daily 1-2      Allergies   Patient has no known allergies.   Review of Systems Review of Systems Ten systems reviewed and are negative for acute change, except as noted in the HPI.    Physical Exam Updated Vital Signs BP 127/84 (BP Location: Right Arm)   Pulse 76   Temp 97.7 F (36.5 C) (Oral)   Resp 16   Ht 5\' 9"  (1.753 m)   Wt 108.9 kg (240 lb)   LMP 02/04/2017 (Approximate) Comment: irregular, every couple months  SpO2 100%   BMI 35.44 kg/m   Physical Exam  Constitutional: She is oriented to person, place, and time. She appears well-developed and well-nourished. No distress.  Nontoxic appearing and in  no acute distress  HENT:  Head: Normocephalic and atraumatic.  Eyes: Conjunctivae and EOM are normal. No scleral icterus.  Neck: Normal range of motion.  Cardiovascular: Normal rate, regular rhythm and intact distal pulses.  Distal radial pulse 2+ in the right upper extremity.  Pulmonary/Chest: Effort normal. No stridor. No respiratory distress.  Respirations even and unlabored  Musculoskeletal: Normal range of motion.       Right shoulder: She exhibits normal range of motion, no bony tenderness, no swelling and no pain.       Thoracic back: She exhibits tenderness and pain. She exhibits normal range of motion, no swelling and no deformity.       Back:  Neurological: She is alert and oriented to person, place, and time. She exhibits normal muscle tone. Coordination normal.  Skin: Skin is warm and dry. No rash noted. She is not diaphoretic. No erythema. No pallor.  Psychiatric: She has a normal mood and affect. Her behavior is normal.  Nursing note and vitals reviewed.    ED Treatments / Results  Labs (all labs ordered are listed, but only abnormal results are displayed) Labs Reviewed - No data to display  EKG  EKG Interpretation None       Radiology No results found.  Procedures Procedures (including critical care time)  Medications Ordered in ED Medications  HYDROcodone-acetaminophen (NORCO/VICODIN) 5-325 MG per tablet 1 tablet (not administered)  naproxen (NAPROSYN) tablet 500 mg (not administered)     Initial Impression / Assessment and Plan / ED Course  I have reviewed the triage vital signs and the nursing notes.  Pertinent labs & imaging results that were available during my care of the patient were reviewed by me and considered in my medical decision making (see chart for details).     Patient presents to the emergency department for evaluation of R posterior shoulder pain. Patient neurovascularly intact on exam with reproducible tenderness along the area  of the right rhomboid muscle.  Plan for supportive management including Robaxin and NSAIDs; primary care follow up as needed. Return precautions discussed and provided. Patient discharged in stable condition with no unaddressed concerns.   Final Clinical Impressions(s) / ED Diagnoses   Final diagnoses:  Rhomboid muscle strain, initial encounter    ED Discharge Orders        Ordered    methocarbamol (ROBAXIN) 500 MG tablet  2 times daily     04/17/17 0056    naproxen (NAPROSYN) 500 MG tablet  2 times daily     04/17/17 0056  lidocaine (LIDODERM) 5 %  Every 24 hours     04/17/17 0056       Antony MaduraHumes, Bynum Mccullars, PA-C 04/17/17 0102    Wilkie AyeHorton, Mayer Maskerourtney F, MD 04/17/17 [phone]

## 2017-04-17 NOTE — ED Notes (Signed)
Pt departed in NAD, refused use of wheelchair.  

## 2017-04-17 NOTE — Discharge Instructions (Signed)
Take naproxen as prescribed for pain and Robaxin as needed for muscle spasm.  Be sure to stretch your right shoulder/arm 3-4 times per day to prevent worsening muscle spasm.  You may apply topical Lidoderm patches as needed for persistent pain.  Follow-up with a primary care doctor to ensure resolution of symptoms.  Avoid strenuous activity or heavy lifting for 1 week.

## 2017-07-06 ENCOUNTER — Encounter (HOSPITAL_COMMUNITY): Payer: Self-pay | Admitting: Emergency Medicine

## 2017-07-06 ENCOUNTER — Other Ambulatory Visit: Payer: Self-pay

## 2017-07-06 DIAGNOSIS — I10 Essential (primary) hypertension: Secondary | ICD-10-CM | POA: Insufficient documentation

## 2017-07-06 DIAGNOSIS — M545 Low back pain: Secondary | ICD-10-CM | POA: Diagnosis present

## 2017-07-06 DIAGNOSIS — F17210 Nicotine dependence, cigarettes, uncomplicated: Secondary | ICD-10-CM | POA: Diagnosis not present

## 2017-07-06 DIAGNOSIS — Z79899 Other long term (current) drug therapy: Secondary | ICD-10-CM | POA: Insufficient documentation

## 2017-07-06 DIAGNOSIS — M5442 Lumbago with sciatica, left side: Secondary | ICD-10-CM | POA: Diagnosis not present

## 2017-07-06 NOTE — ED Triage Notes (Addendum)
Patient complaining of back/hip pain. Patient states that she has a hx of sciatica pain. She thinks this is what it is. Patient states that she has taken a lot of over the counter medication. Patient states this started a week ago. The medication is not working. Patient complaining of painful bowel movements.

## 2017-07-07 ENCOUNTER — Emergency Department (HOSPITAL_COMMUNITY)
Admission: EM | Admit: 2017-07-07 | Discharge: 2017-07-07 | Disposition: A | Discharge status: Home / Self Care | Payer: Medicaid Other | Attending: Emergency Medicine | Admitting: Emergency Medicine

## 2017-07-07 DIAGNOSIS — M5442 Lumbago with sciatica, left side: Secondary | ICD-10-CM

## 2017-07-07 MED ORDER — METHOCARBAMOL 500 MG PO TABS: 500.0000 mg | ORAL_TABLET | Freq: Two times a day (BID) | ORAL | 0 refills | Status: DC

## 2017-07-07 MED ORDER — HYDROCODONE-ACETAMINOPHEN 5-325 MG PO TABS: 2.0000 | ORAL_TABLET | Freq: Four times a day (QID) | ORAL | 0 refills | Status: DC | PRN

## 2017-07-07 MED ORDER — HYDROCODONE-ACETAMINOPHEN 5-325 MG PO TABS
2.0000 | ORAL_TABLET | Freq: Once | ORAL | Status: AC
  Administered 2017-07-07: 2 via ORAL
  Filled 2017-07-07: qty 2

## 2017-07-07 MED ORDER — IBUPROFEN 800 MG PO TABS: 800.0000 mg | ORAL_TABLET | Freq: Three times a day (TID) | ORAL | 0 refills | Status: DC

## 2017-07-07 MED ORDER — PREDNISONE 20 MG PO TABS: ORAL_TABLET | ORAL | 0 refills | Status: DC

## 2017-07-07 MED ORDER — METHOCARBAMOL 500 MG PO TABS
500.0000 mg | ORAL_TABLET | Freq: Once | ORAL | Status: AC
  Administered 2017-07-07: 500 mg via ORAL
  Filled 2017-07-07: qty 1

## 2017-07-07 MED ORDER — PREDNISONE 20 MG PO TABS
60.0000 mg | ORAL_TABLET | Freq: Once | ORAL | Status: AC
  Administered 2017-07-07: 60 mg via ORAL
  Filled 2017-07-07: qty 3

## 2017-07-07 NOTE — ED Provider Notes (Signed)
[facility]-EMERGENCY DEPT Provider Note   CSN: [id number] Arrival date & time: 07/06/17  2249     History   Chief Complaint Chief Complaint  Patient presents with  . Back Pain    HPI Dorothy Meadows is a 32 y.o. female with a hx of low back pain, HTN, SVT presents to the Emergency Department complaining of gradual, persistent, progressively worsening sharp, stabbing pain in the lower left side onset 9 days ago. Pt reports pain is the same a previous flares her sciatic nerve pain. Her last flare was Nov 2018.  Pt reports taking "back aid" medication without significant relief.  She reports pain is worse with walking.  Pt reports pain improves when she lays downs.  Pt denies falls or known trauma.  p t reports she was moving packs of shingles when the pain started.  She denies numbness, weakness, loss of bowel or bladder control.  Pt reports the pain radiates from her low back down the back of her left thigh into the left knee.  Pt denies abd pain, N/V/D.  LMP: 1st week of Feb 2019; pt reports irregular menses.   Pt denies anticoagulant usage, IV drug usage, fever, chills.   The history is provided by the patient and medical records. No language interpreter was used.    Past Medical History:  Diagnosis Date  . Genital warts   . Gestational diabetes    first pregnancy  . Gonorrhea    1st trimester this pregnancy  . Hyperlipidemia   . Hypertension   . No pertinent past medical history   . SVT (supraventricular tachycardia) [facility])     Patient Active Problem List   Diagnosis Date Noted  . SVT (supraventricular tachycardia) (HCC) 02/18/2014  . Cigarette smoker one half pack a day or less 12/19/2012  . GERD (gastroesophageal reflux disease) 11/14/2012    Past Surgical History:  Procedure Laterality Date  . removal of warts      OB History    Gravida Para Term Preterm AB Living   4 3 3  0 1 3   SAB TAB Ectopic Multiple Live Births   1 0 0 0 3        Home Medications    Prior to Admission medications   Medication Sig Start Date End Date Taking? Authorizing Provider  atenolol (TENORMIN) 25 MG tablet Take 1 tablet (25 mg total) by mouth daily. 03/27/16   Dettinger, Elige Radon, MD  etonogestrel (NEXPLANON) 68 MG IMPL implant 1 each by Subdermal route once. Inserted on 04/12/2016    [provider]  HYDROcodone-acetaminophen (NORCO/VICODIN) 5-325 MG tablet Take 2 tablets by mouth every 6 (six) hours as needed. 07/07/17   Juno Bozard, Dahlia Client, PA-C  ibuprofen (ADVIL,MOTRIN) 800 MG tablet Take 1 tablet (800 mg total) by mouth 3 (three) times daily. 07/07/17   Henri Baumler, Dahlia Client, PA-C  lidocaine (LIDODERM) 5 % Place 1 patch onto the skin daily. Remove & Discard patch within 12 hours or as directed by MD 04/17/17   Antony Madura, PA-C  methocarbamol (ROBAXIN) 500 MG tablet Take 1 tablet (500 mg total) by mouth 2 (two) times daily. 07/07/17   Caiden Monsivais, Dahlia Client, PA-C  Multiple Vitamins-Minerals (MULTIVITAMIN GUMMIES WOMENS PO) Take by mouth.    [provider]  naproxen (NAPROSYN) 500 MG tablet Take 1 tablet (500 mg total) by mouth 2 (two) times daily. 04/17/17   Antony Madura, PA-C  omeprazole (PRILOSEC) 20 MG capsule Take 1 capsule (20 mg total) by mouth daily  as needed (for acid reflux). 03/27/16   Dettinger, Elige RadonJoshua A, MD  predniSONE (DELTASONE) 20 MG tablet 2 tabs x 3 days, then 1.5 tabs x 3 days, then 1 tab x 3 days, then 0.5 tabs x 3 days 07/07/17   Inita Uram, Dahlia ClientHannah, PA-C    Family History Family History  Problem Relation Age of Onset  . Diabetes Mother   . Hypertension Mother   . Depression Mother   . Anxiety disorder Mother   . Diabetes Maternal Grandmother   . Hypertension Maternal Grandmother   . Heart disease Maternal Grandmother        7770s    Social History Social History   Tobacco Use  . Smoking status: Current Every Day Smoker    Packs/day: 0.50    Years: 15.00    Pack years: 7.50    Types:  Cigarettes  . Smokeless tobacco: Never Used  Substance Use Topics  . Alcohol use: Yes    Comment: occasionally, birthdays and get togethers  . Drug use: Yes    Types: Marijuana    Comment: occ daily 1-2      Allergies   Patient has no known allergies.   Review of Systems Review of Systems  Constitutional: Negative for fatigue and fever.  Respiratory: Negative for chest tightness and shortness of breath.   Cardiovascular: Negative for chest pain.  Gastrointestinal: Negative for abdominal pain, diarrhea, nausea and vomiting.  Genitourinary: Negative for dysuria, frequency, hematuria and urgency.  Musculoskeletal: Positive for back pain. Negative for gait problem, joint swelling, neck pain and neck stiffness.  Skin: Negative for rash.  Neurological: Negative for weakness, light-headedness, numbness and headaches.  All other systems reviewed and are negative.    Physical Exam Updated Vital Signs BP 116/71 (BP Location: Left Arm)   Pulse 63   Temp 98.1 F (36.7 C) (Oral)   Resp 16   Ht 5\' 9"  (1.753 m)   Wt 108.9 kg (240 lb)   SpO2 100%   BMI 35.44 kg/m   Physical Exam  Constitutional: She appears well-developed and well-nourished. No distress.  HENT:  Head: Normocephalic and atraumatic.  Mouth/Throat: Oropharynx is clear and moist. No oropharyngeal exudate.  Eyes: Conjunctivae are normal.  Neck: Normal range of motion. Neck supple.  Full ROM without pain  Cardiovascular: Normal rate, regular rhythm and intact distal pulses.  Pulmonary/Chest: Effort normal and breath sounds normal. No respiratory distress. She has no wheezes.  Abdominal: Soft. She exhibits no distension. There is no tenderness.  Musculoskeletal:  Full range of motion of the T-spine and L-spine No midline tenderness to the  T-spine or L-spine Tenderness to palpation of the left paraspinous muscles of the L-spine and over the left SI joint with reproducible radiculopathy into the left leg   Lymphadenopathy:    She has no cervical adenopathy.  Neurological: She is alert.  Speech is clear and goal oriented, follows commands Normal 5/5 strength in upper and lower extremities bilaterally including dorsiflexion and plantar flexion, strong and equal grip strength Sensation normal to light and sharp touch Moves extremities without ataxia, coordination intact Antalgic gait without foot drop Normal balance No Clonus  Skin: Skin is warm and dry. No rash noted. She is not diaphoretic. No erythema.  Psychiatric: She has a normal mood and affect. Her behavior is normal.  Nursing note and vitals reviewed.    ED Treatments / Results  Labs (all labs ordered are listed, but only abnormal results are displayed) Labs Reviewed - No data  to display  EKG  EKG Interpretation None       Radiology No results found.  Procedures Procedures (including critical care time)  Medications Ordered in ED Medications  HYDROcodone-acetaminophen (NORCO/VICODIN) 5-325 MG per tablet 2 tablet (2 tablets Oral Given 07/07/17 0146)  methocarbamol (ROBAXIN) tablet 500 mg (500 mg Oral Given 07/07/17 0146)  predniSONE (DELTASONE) tablet 60 mg (60 mg Oral Given 07/07/17 0146)     Initial Impression / Assessment and Plan / ED Course  I have reviewed the triage vital signs and the nursing notes.  Pertinent labs & imaging results that were available during my care of the patient were reviewed by me and considered in my medical decision making (see chart for details).  Clinical Course as of Jul 07 208  Sat Jul 07, 2017  0205 [facility] narcotic database accessed.  Last prescription was February 2018 for 4 days.  [HM]    Clinical Course User Index [HM] Celest Reitz, Dahlia Client, [place]    Patient with back pain.  No neurological deficits and normal neuro exam.  Patient can walk but states is painful.  No loss of bowel or bladder control.  No concern for cauda equina.  No fever, night sweats, weight loss,  h/o cancer, IVDU.  RICE protocol and pain medicine indicated and discussed with patient.  Patient given referral to orthopedics if symptoms persist.   Final Clinical Impressions(s) / ED Diagnoses   Final diagnoses:  Acute left-sided low back pain with left-sided sciatica    ED Discharge Orders        Ordered    methocarbamol (ROBAXIN) 500 MG tablet  2 times daily     07/07/17 0206    ibuprofen (ADVIL,MOTRIN) 800 MG tablet  3 times daily     07/07/17 0206    predniSONE (DELTASONE) 20 MG tablet     07/07/17 0206    HYDROcodone-acetaminophen (NORCO/VICODIN) 5-325 MG tablet  Every 6 hours PRN     07/07/17 0206       Tyjae Shvartsman, Boyd Kerbs 07/07/17 0210    Lorre Nick, MD 07/07/17 1359

## 2017-07-07 NOTE — Discharge Instructions (Signed)
1. Medications: robaxin, Ibuprofen, prednisone, vicodin, usual home medications 2. Treatment: rest, drink plenty of fluids, gentle stretching as discussed, alternate ice and heat 3. Follow Up: Please followup with your primary doctor in 3 days for discussion of your diagnoses and further evaluation after today's visit; if you do not have a primary care doctor use the resource guide provided to find one;  Return to the ER for worsening back pain, difficulty walking, loss of bowel or bladder control or other concerning symptoms

## 2017-09-15 ENCOUNTER — Emergency Department (HOSPITAL_COMMUNITY)
Admission: EM | Admit: 2017-09-15 | Discharge: 2017-09-15 | Disposition: A | Discharge status: Home / Self Care | Payer: Medicaid Other | Attending: Emergency Medicine | Admitting: Emergency Medicine

## 2017-09-15 ENCOUNTER — Other Ambulatory Visit: Payer: Self-pay

## 2017-09-15 DIAGNOSIS — Z5321 Procedure and treatment not carried out due to patient leaving prior to being seen by health care provider: Secondary | ICD-10-CM | POA: Diagnosis not present

## 2017-09-15 DIAGNOSIS — R221 Localized swelling, mass and lump, neck: Secondary | ICD-10-CM | POA: Insufficient documentation

## 2017-09-15 DIAGNOSIS — K0889 Other specified disorders of teeth and supporting structures: Secondary | ICD-10-CM | POA: Diagnosis present

## 2017-09-15 NOTE — ED Notes (Signed)
Called pt no response.  

## 2017-09-15 NOTE — ED Triage Notes (Signed)
Pt reports "huge cavity" and right-sided neck swelling. Pt reports nausea. Pt denies fever.

## 2017-09-16 ENCOUNTER — Encounter (HOSPITAL_COMMUNITY): Payer: Self-pay | Admitting: Emergency Medicine

## 2017-09-16 ENCOUNTER — Other Ambulatory Visit: Payer: Self-pay

## 2017-09-16 ENCOUNTER — Emergency Department (HOSPITAL_COMMUNITY)
Admission: EM | Admit: 2017-09-16 | Discharge: 2017-09-16 | Disposition: A | Discharge status: Home / Self Care | Payer: Medicaid Other | Attending: Emergency Medicine | Admitting: Emergency Medicine

## 2017-09-16 DIAGNOSIS — K047 Periapical abscess without sinus: Secondary | ICD-10-CM

## 2017-09-16 DIAGNOSIS — I10 Essential (primary) hypertension: Secondary | ICD-10-CM | POA: Diagnosis not present

## 2017-09-16 DIAGNOSIS — F17210 Nicotine dependence, cigarettes, uncomplicated: Secondary | ICD-10-CM | POA: Insufficient documentation

## 2017-09-16 DIAGNOSIS — Z79899 Other long term (current) drug therapy: Secondary | ICD-10-CM | POA: Insufficient documentation

## 2017-09-16 DIAGNOSIS — K0889 Other specified disorders of teeth and supporting structures: Secondary | ICD-10-CM | POA: Diagnosis present

## 2017-09-16 MED ORDER — CLINDAMYCIN HCL 150 MG PO CAPS
450.0000 mg | ORAL_CAPSULE | Freq: Once | ORAL | Status: AC
  Administered 2017-09-16: 450 mg via ORAL
  Filled 2017-09-16: qty 3

## 2017-09-16 MED ORDER — BUPIVACAINE-EPINEPHRINE (PF) 0.5% -1:200000 IJ SOLN
10.0000 mL | Freq: Once | INTRAMUSCULAR | Status: AC
  Administered 2017-09-16: 10 mL
  Filled 2017-09-16: qty 10

## 2017-09-16 MED ORDER — OXYCODONE-ACETAMINOPHEN 5-325 MG PO TABS
2.0000 | ORAL_TABLET | Freq: Once | ORAL | Status: AC
  Administered 2017-09-16: 2 via ORAL
  Filled 2017-09-16: qty 2

## 2017-09-16 MED ORDER — CLINDAMYCIN HCL 150 MG PO CAPS: 450.0000 mg | ORAL_CAPSULE | Freq: Three times a day (TID) | ORAL | 0 refills | Status: DC

## 2017-09-16 MED ORDER — IBUPROFEN 800 MG PO TABS: 800.0000 mg | ORAL_TABLET | Freq: Three times a day (TID) | ORAL | 0 refills | Status: DC

## 2017-09-16 NOTE — Discharge Instructions (Addendum)
1. Medications: clindamycin, ibuprofen, usual home medications 2. Treatment: rest, drink plenty of fluids, take medications as prescribed 3. Follow Up: Please followup with dentistry within 1 week for discussion of your diagnoses and further evaluation after today's visit; if you do not have a primary care doctor use the resource guide provided to find one; Return to the ER for high fevers, difficulty breathing, difficulty swallowing or other concerning symptoms

## 2017-09-16 NOTE — ED Provider Notes (Signed)
[facility] EMERGENCY DEPARTMENT Provider Note   CSN: [id number] Arrival date & time: 09/16/17  7829     History   Chief Complaint Chief Complaint  Patient presents with  . Dental Pain    HPI Dorothy Meadows is a 32 y.o. female with a hx of HTN presents to the Emergency Department complaining of gradual, persistent, progressively worsening right lower dental pain onset 3 days ago.  Patient reports in the last 24 hours she has had significant swelling of the gingiva surrounding the tooth and of her right lymph nodes.  Patient reports taking Tylenol and ibuprofen without relief.  She has associated generalized and throbbing headache.  No otalgia or facial swelling.  No fevers, chills, difficulty swallowing, difficulty speaking, nausea or vomiting.  Eating and drinking make her symptoms worse.  Nothing seems to make them better.  She reports she has not seen a dentist in many years.  HPI  Past Medical History:  Diagnosis Date  . Genital warts   . Gestational diabetes    first pregnancy  . Gonorrhea    1st trimester this pregnancy  . Hyperlipidemia   . Hypertension   . No pertinent past medical history   . SVT (supraventricular tachycardia) [facility])     Patient Active Problem List   Diagnosis Date Noted  . SVT (supraventricular tachycardia) (HCC) 02/18/2014  . Cigarette smoker one half pack a day or less 12/19/2012  . GERD (gastroesophageal reflux disease) 11/14/2012    Past Surgical History:  Procedure Laterality Date  . removal of warts       OB History    Gravida  4   Para  3   Term  3   Preterm  0   AB  1   Living  3     SAB  1   TAB  0   Ectopic  0   Multiple  0   Live Births  3            Home Medications    Prior to Admission medications   Medication Sig Start Date End Date Taking? Authorizing Provider  atenolol (TENORMIN) 25 MG tablet Take 1 tablet (25 mg total) by mouth daily. 03/27/16   Dettinger, Elige Radon, MD    clindamycin (CLEOCIN) 150 MG capsule Take 3 capsules (450 mg total) by mouth 3 (three) times daily. 09/16/17   Loran Auguste, Dahlia Client, PA-C  etonogestrel (NEXPLANON) 68 MG IMPL implant 1 each by Subdermal route once. Inserted on 04/12/2016    [provider]  HYDROcodone-acetaminophen (NORCO/VICODIN) 5-325 MG tablet Take 2 tablets by mouth every 6 (six) hours as needed. 07/07/17   Tonatiuh Mallon, Dahlia Client, PA-C  ibuprofen (ADVIL,MOTRIN) 800 MG tablet Take 1 tablet (800 mg total) by mouth 3 (three) times daily. 09/16/17   Rosanne Wohlfarth, Dahlia Client, PA-C  lidocaine (LIDODERM) 5 % Place 1 patch onto the skin daily. Remove & Discard patch within 12 hours or as directed by MD 04/17/17   Antony Madura, PA-C  methocarbamol (ROBAXIN) 500 MG tablet Take 1 tablet (500 mg total) by mouth 2 (two) times daily. 07/07/17   Yosgar Demirjian, Dahlia Client, PA-C  Multiple Vitamins-Minerals (MULTIVITAMIN GUMMIES WOMENS PO) Take by mouth.    [provider]  naproxen (NAPROSYN) 500 MG tablet Take 1 tablet (500 mg total) by mouth 2 (two) times daily. 04/17/17   Antony Madura, PA-C  omeprazole (PRILOSEC) 20 MG capsule Take 1 capsule (20 mg total) by mouth daily as needed (for acid reflux). 03/27/16  Dettinger, Elige Radon, MD  predniSONE (DELTASONE) 20 MG tablet 2 tabs x 3 days, then 1.5 tabs x 3 days, then 1 tab x 3 days, then 0.5 tabs x 3 days 07/07/17   Jaydy Fitzhenry, Dahlia Client, PA-C    Family History Family History  Problem Relation Age of Onset  . Diabetes Mother   . Hypertension Mother   . Depression Mother   . Anxiety disorder Mother   . Diabetes Maternal Grandmother   . Hypertension Maternal Grandmother   . Heart disease Maternal Grandmother        76s    Social History Social History   Tobacco Use  . Smoking status: Current Every Day Smoker    Packs/day: 0.50    Years: 15.00    Pack years: 7.50    Types: Cigarettes  . Smokeless tobacco: Never Used  Substance Use Topics  . Alcohol use: Yes    Comment:  occasionally, birthdays and get togethers  . Drug use: Yes    Types: Marijuana    Comment: occ daily 1-2      Allergies   Patient has no known allergies.   Review of Systems Review of Systems  Constitutional: Negative for appetite change, chills and fever.  HENT: Positive for dental problem. Negative for drooling, ear pain, facial swelling, nosebleeds, postnasal drip, rhinorrhea and trouble swallowing.   Eyes: Negative for pain and redness.  Respiratory: Negative for cough and wheezing.   Cardiovascular: Negative for chest pain.  Gastrointestinal: Negative for abdominal pain, nausea and vomiting.  Musculoskeletal: Negative for neck pain and neck stiffness.  Skin: Negative for color change and rash.  Neurological: Positive for headaches. Negative for weakness and light-headedness.  All other systems reviewed and are negative.    Physical Exam Updated Vital Signs BP (!) 149/99 (BP Location: Right Arm)   Pulse 90   Temp 98.2 F (36.8 C) (Oral)   Resp 16   SpO2 98%   Physical Exam  Constitutional: She appears well-developed and well-nourished.  HENT:  Head: Normocephalic.  Right Ear: Tympanic membrane, external ear and ear canal normal.  Left Ear: Tympanic membrane, external ear and ear canal normal.  Nose: Nose normal. Right sinus exhibits no maxillary sinus tenderness and no frontal sinus tenderness. Left sinus exhibits no maxillary sinus tenderness and no frontal sinus tenderness.  Mouth/Throat: Uvula is midline, oropharynx is clear and moist and mucous membranes are normal. No oral lesions. Abnormal dentition. Dental caries present. No uvula swelling or lacerations. No oropharyngeal exudate, posterior oropharyngeal edema, posterior oropharyngeal erythema or tonsillar abscesses.  Right lower molar with large abscess.  Also large dental carry and tenderness to palpation. No gross abscess No fluctuance or induration to the buccal mucosa or floor of the mouth  Eyes: Pupils  are equal, round, and reactive to light. Conjunctivae are normal. Right eye exhibits no discharge. Left eye exhibits no discharge.  Neck: Normal range of motion. Neck supple.  No stridor Handling secretions without difficulty No nuchal rigidity No cervical lymphadenopathy   Cardiovascular: Normal rate, regular rhythm and normal heart sounds.  Pulmonary/Chest: Effort normal. No respiratory distress.  Equal chest rise  Abdominal: Soft. Bowel sounds are normal. She exhibits no distension. There is no tenderness.  Lymphadenopathy:       Head (right side): Submandibular and tonsillar adenopathy present. No submental, no preauricular, no posterior auricular and no occipital adenopathy present.       Head (left side): No submandibular and no tonsillar adenopathy present.    She  has no cervical adenopathy.  Neurological: She is alert.  Skin: Skin is warm and dry.  Psychiatric: She has a normal mood and affect.  Nursing note and vitals reviewed.    ED Treatments / Results   Procedures .Marland KitchenIncision and Drainage Date/Time: 09/16/2017 6:47 AM Performed by: Dierdre Forth, PA-C Authorized by: Dierdre Forth, PA-C   Consent:    Consent obtained:  Verbal   Consent given by:  Patient   Risks discussed:  Incomplete drainage, infection, bleeding and pain   Alternatives discussed:  No treatment Location:    Type:  Abscess   Location:  Mouth   Mouth location:  Alveolar process Anesthesia (see MAR for exact dosages):    Anesthesia method:  Local infiltration   Local anesthetic:  Bupivacaine 0.5% WITH epi Procedure type:    Complexity:  Complex Procedure details:    Needle aspiration: yes     Needle size:  18 G   Incision types:  Stab incision   Scalpel blade:  11   Wound management:  Probed and deloculated   Drainage:  Bloody and purulent   Drainage amount:  Moderate   Wound treatment:  Wound left open   Packing materials:  None Post-procedure details:    Patient tolerance  of procedure:  Tolerated well, no immediate complications   (including critical care time)  Medications Ordered in ED Medications  oxyCODONE-acetaminophen (PERCOCET/ROXICET) 5-325 MG per tablet 2 tablet (2 tablets Oral Given 09/16/17 0519)  clindamycin (CLEOCIN) capsule 450 mg (450 mg Oral Given 09/16/17 0519)  bupivacaine-epinephrine (MARCAINE W/ EPI) 0.5% -1:200000 injection 10 mL (10 mLs Infiltration Given 09/16/17 0529)     Initial Impression / Assessment and Plan / ED Course  I have reviewed the triage vital signs and the nursing notes.  Pertinent labs & imaging results that were available during my care of the patient were reviewed by me and considered in my medical decision making (see chart for details).     Patient with toothache.  Abscess of the right lower molar for which I&D was possible.  Local anesthesia with both needle aspiration and incision.  Wound was probed deloculated as well as it washed..  Exam unconcerning for Ludwig's angina or spread of infection.  Will treat with clindamycin and anti-inflammatories medicine.  Urged patient to follow-up with dentist.  Also discussed reasons to return immediately to the emergency department.   Final Clinical Impressions(s) / ED Diagnoses   Final diagnoses:  Dental abscess    ED Discharge Orders        Ordered    clindamycin (CLEOCIN) 150 MG capsule  3 times daily     09/16/17 0643    ibuprofen (ADVIL,MOTRIN) 800 MG tablet  3 times daily     09/16/17 0643       Devlyn Parish, Dahlia Client, PA-C 09/16/17 1610    Gilda Crease, MD 09/16/17 [phone]

## 2017-09-16 NOTE — ED Triage Notes (Signed)
Pt reports a "huge cavity" which is causing her pain and right sided neck pain.  Pt states she needs oral surgery however she has a heart problem (SVT).

## 2017-09-16 NOTE — ED Notes (Signed)
Pt a one touch pt, see PA's assessment.

## 2017-10-09 ENCOUNTER — Emergency Department (HOSPITAL_COMMUNITY): Payer: Medicaid Other

## 2017-10-09 ENCOUNTER — Other Ambulatory Visit: Payer: Self-pay

## 2017-10-09 ENCOUNTER — Encounter (HOSPITAL_COMMUNITY): Payer: Self-pay | Admitting: *Deleted

## 2017-10-09 ENCOUNTER — Emergency Department (HOSPITAL_COMMUNITY)
Admission: EM | Admit: 2017-10-09 | Discharge: 2017-10-09 | Disposition: A | Discharge status: Home / Self Care | Payer: Medicaid Other | Attending: Emergency Medicine | Admitting: Emergency Medicine

## 2017-10-09 DIAGNOSIS — Y92009 Unspecified place in unspecified non-institutional (private) residence as the place of occurrence of the external cause: Secondary | ICD-10-CM

## 2017-10-09 DIAGNOSIS — Y93E1 Activity, personal bathing and showering: Secondary | ICD-10-CM | POA: Diagnosis not present

## 2017-10-09 DIAGNOSIS — W16212A Fall in (into) filled bathtub causing other injury, initial encounter: Secondary | ICD-10-CM | POA: Insufficient documentation

## 2017-10-09 DIAGNOSIS — I10 Essential (primary) hypertension: Secondary | ICD-10-CM | POA: Diagnosis not present

## 2017-10-09 DIAGNOSIS — S20229A Contusion of unspecified back wall of thorax, initial encounter: Secondary | ICD-10-CM | POA: Diagnosis not present

## 2017-10-09 DIAGNOSIS — Y999 Unspecified external cause status: Secondary | ICD-10-CM | POA: Diagnosis not present

## 2017-10-09 DIAGNOSIS — Z79899 Other long term (current) drug therapy: Secondary | ICD-10-CM | POA: Insufficient documentation

## 2017-10-09 DIAGNOSIS — S300XXA Contusion of lower back and pelvis, initial encounter: Secondary | ICD-10-CM | POA: Diagnosis not present

## 2017-10-09 DIAGNOSIS — F17210 Nicotine dependence, cigarettes, uncomplicated: Secondary | ICD-10-CM | POA: Insufficient documentation

## 2017-10-09 DIAGNOSIS — W19XXXA Unspecified fall, initial encounter: Secondary | ICD-10-CM

## 2017-10-09 DIAGNOSIS — S3992XA Unspecified injury of lower back, initial encounter: Secondary | ICD-10-CM | POA: Diagnosis present

## 2017-10-09 DIAGNOSIS — Y92002 Bathroom of unspecified non-institutional (private) residence single-family (private) house as the place of occurrence of the external cause: Secondary | ICD-10-CM | POA: Diagnosis not present

## 2017-10-09 LAB — I-STAT BETA HCG BLOOD, ED (MC, WL, AP ONLY)

## 2017-10-09 MED ORDER — NAPROXEN 500 MG PO TABS: ORAL_TABLET | ORAL | 0 refills | Status: DC

## 2017-10-09 MED ORDER — KETOROLAC TROMETHAMINE 60 MG/2ML IM SOLN
60.0000 mg | Freq: Once | INTRAMUSCULAR | Status: AC
  Administered 2017-10-09: 60 mg via INTRAMUSCULAR
  Filled 2017-10-09: qty 2

## 2017-10-09 NOTE — ED Provider Notes (Signed)
[facility] EMERGENCY DEPARTMENT Provider Note   CSN: [id number] Arrival date & time: 10/09/17  0434  Time seen 05:35 AM   History   Chief Complaint Chief Complaint  Patient presents with  . Back Pain    HPI Dorothy Meadows is a 32 y.o. female.  HPI patient states about 45 minutes prior to arrival she was getting into the tub when she slipped and fell backwards hitting the lower part of her back on the toilet.  She is then fell off to the side.  She complains of pain in her lower back and tailbone.  She denies hitting her head or having loss of consciousness.  She describes the pain is dull and states that it radiates down her whole left leg to her knee.  She states walking, standing, and movement make it worse.  Nothing makes it feel better.  She has taken no medications.  She states she has had a sciatic nerve problem on the left in the past but this feels different.  PCP Dettinger, Elige Radon, MD   Past Medical History:  Diagnosis Date  . Genital warts   . Gestational diabetes    first pregnancy  . Gonorrhea    1st trimester this pregnancy  . Hyperlipidemia   . Hypertension   . No pertinent past medical history   . SVT (supraventricular tachycardia) [facility])     Patient Active Problem List   Diagnosis Date Noted  . SVT (supraventricular tachycardia) (HCC) 02/18/2014  . Cigarette smoker one half pack a day or less 12/19/2012  . GERD (gastroesophageal reflux disease) 11/14/2012    Past Surgical History:  Procedure Laterality Date  . removal of warts       OB History    Gravida  4   Para  3   Term  3   Preterm  0   AB  1   Living  3     SAB  1   TAB  0   Ectopic  0   Multiple  0   Live Births  3            Home Medications    Prior to Admission medications   Medication Sig Start Date End Date Taking? Authorizing Provider  atenolol (TENORMIN) 25 MG tablet Take 1 tablet (25 mg total) by mouth daily. 03/27/16   Dettinger, Elige Radon, MD    clindamycin (CLEOCIN) 150 MG capsule Take 3 capsules (450 mg total) by mouth 3 (three) times daily. 09/16/17   Muthersbaugh, Dahlia Client, PA-C  etonogestrel (NEXPLANON) 68 MG IMPL implant 1 each by Subdermal route once. Inserted on 04/12/2016    [provider]  HYDROcodone-acetaminophen (NORCO/VICODIN) 5-325 MG tablet Take 2 tablets by mouth every 6 (six) hours as needed. 07/07/17   Muthersbaugh, Dahlia Client, PA-C  ibuprofen (ADVIL,MOTRIN) 800 MG tablet Take 1 tablet (800 mg total) by mouth 3 (three) times daily. 09/16/17   Muthersbaugh, Dahlia Client, PA-C  lidocaine (LIDODERM) 5 % Place 1 patch onto the skin daily. Remove & Discard patch within 12 hours or as directed by MD 04/17/17   Antony Madura, PA-C  methocarbamol (ROBAXIN) 500 MG tablet Take 1 tablet (500 mg total) by mouth 2 (two) times daily. 07/07/17   Muthersbaugh, Dahlia Client, PA-C  Multiple Vitamins-Minerals (MULTIVITAMIN GUMMIES WOMENS PO) Take by mouth.    [provider]  naproxen (NAPROSYN) 500 MG tablet Take 1 po BID with food prn pain 10/09/17   Devoria Albe, MD  omeprazole (PRILOSEC) 20 MG  capsule Take 1 capsule (20 mg total) by mouth daily as needed (for acid reflux). 03/27/16   Dettinger, Elige RadonJoshua A, MD  predniSONE (DELTASONE) 20 MG tablet 2 tabs x 3 days, then 1.5 tabs x 3 days, then 1 tab x 3 days, then 0.5 tabs x 3 days 07/07/17   Muthersbaugh, Dahlia ClientHannah, PA-C    Family History Family History  Problem Relation Age of Onset  . Diabetes Mother   . Hypertension Mother   . Depression Mother   . Anxiety disorder Mother   . Diabetes Maternal Grandmother   . Hypertension Maternal Grandmother   . Heart disease Maternal Grandmother        5870s    Social History Social History   Tobacco Use  . Smoking status: Current Every Day Smoker    Packs/day: 0.50    Years: 15.00    Pack years: 7.50    Types: Cigarettes  . Smokeless tobacco: Never Used  Substance Use Topics  . Alcohol use: Yes    Comment: occasionally, birthdays and get  togethers  . Drug use: Yes    Types: Marijuana    Comment: occ daily 1-2   unemployed, stay at home mom   Allergies   Patient has no known allergies.   Review of Systems Review of Systems  All other systems reviewed and are negative.    Physical Exam Updated Vital Signs BP 121/68 (BP Location: Left Arm)   Pulse 92   Temp 97.8 F (36.6 C) (Oral)   Resp 18   Ht 5\' 9"  (1.753 m)   Wt 111.1 kg (245 lb)   SpO2 100%   BMI 36.18 kg/m   Vital signs normal    Physical Exam  Constitutional: She is oriented to person, place, and time. She appears well-developed and well-nourished.  Obese, sitting on the side of the stretcher leaning to her right  HENT:  Head: Normocephalic and atraumatic.  Right Ear: External ear normal.  Left Ear: External ear normal.  Mouth/Throat: Oropharynx is clear and moist.  Eyes: Conjunctivae and EOM are normal.  Neck: Normal range of motion.  Cardiovascular: Normal rate.  Pulmonary/Chest: Effort normal. No respiratory distress.  Musculoskeletal: She exhibits tenderness. She exhibits no edema or deformity.       Back:  She has some mild diffuse tenderness of her thoracic spine and then she is tender at her lower lumbar spine and especially over her coccyx.  Neurological: She is alert and oriented to person, place, and time. No cranial nerve deficit.  Skin: Skin is warm and dry. No pallor.  Patient is noted to have a lot of dimpling and pockets of her skin of her buttocks bilaterally.  Straight leg raising is negative (she states it hurts in her left knee when she straightens her left leg).  Reflexes are 1+ and equal.  Psychiatric: She has a normal mood and affect. Her behavior is normal. Thought content normal.  Nursing note and vitals reviewed.    ED Treatments / Results  Labs (all labs ordered are listed, but only abnormal results are displayed) Results for orders placed or performed during the hospital encounter of 10/09/17  I-Stat beta hCG  blood, ED  Result Value Ref Range   I-stat hCG, quantitative <5.0 <5 mIU/mL   Comment 3           Laboratory interpretation all normal    EKG None  Radiology Dg Lumbar Spine Complete  Result Date: 10/09/2017 CLINICAL DATA:  Fall this morning  onto the toilet striking back. Lumbosacral back pain. EXAM: LUMBAR SPINE - COMPLETE 4+ VIEW COMPARISON:  None. FINDINGS: The alignment is maintained. Vertebral body heights are normal. There is no listhesis. The posterior elements are intact. Mild disc space narrowing at L4-L5 with minimal endplate spurring. No fracture. Sacroiliac joints are congruent. Sclerotic focus in the right iliac bone is likely a bone island. IMPRESSION: No fracture of the lumbar spine. Electronically Signed   By: Rubye Oaks M.D.   On: 10/09/2017 06:13    Procedures Procedures (including critical care time)  Medications Ordered in ED Medications  ketorolac (TORADOL) injection 60 mg (60 mg Intramuscular Given 10/09/17 0516)     Initial Impression / Assessment and Plan / ED Course  I have reviewed the triage vital signs and the nursing notes.  Pertinent labs & imaging results that were available during my care of the patient were reviewed by me and considered in my medical decision making (see chart for details).   Patient was given Toradol for pain.  X-ray studies were ordered.   6:58 AM I discussed her x-rays with the radiologist, Dr. Manus Gunning, I had ordered separate coccyx x-rays however the radiology tech stated she would just do the lumbar spine with a added lateral view of the coccyx.  In her description of the lumbar spine she did not specifically mention the coccyx.  She looked at it now and stated it looked normal without fracture.  7 AM I talked to the patient about her x-ray results.  We discussed there could be a small break in the bone that does not show up on initial x-rays and if she has pain lasting over 10 to 14 days that could be a sign that there  was a fracture there.  She was advised to use ice for comfort, she was sent home with medications to take.   Final Clinical Impressions(s) / ED Diagnoses   Final diagnoses:  Fall in home, initial encounter  Contusion of back, unspecified laterality, initial encounter  Contusion of coccyx, initial encounter    ED Discharge Orders        Ordered    naproxen (NAPROSYN) 500 MG tablet     10/09/17 0704    OTC  Acetaminophen  Plan discharge  Devoria Albe, MD, Concha Pyo, MD 10/09/17 [phone]

## 2017-10-09 NOTE — Discharge Instructions (Addendum)
Use ice packs for comfort. You may need to sit on a "donut" a special pillow with a hole in the middle like a life saver or sit on a fluffy pillow for comfort. Take the naproxen with acetaminophen 1000 mg 4 times a day for pain. You should expect to be sore for the next several days to a week. If you are painful after 2 weeks you should be rechecked by your primary care doctor.

## 2017-10-09 NOTE — ED Triage Notes (Signed)
Pt states she was getting out of the shower and fell hitting her lower back on the toilet

## 2018-07-14 ENCOUNTER — Other Ambulatory Visit: Payer: Self-pay

## 2018-07-14 ENCOUNTER — Encounter (HOSPITAL_COMMUNITY): Payer: Self-pay | Admitting: Emergency Medicine

## 2018-07-14 ENCOUNTER — Emergency Department (HOSPITAL_COMMUNITY)
Admission: EM | Admit: 2018-07-14 | Discharge: 2018-07-14 | Disposition: A | Discharge status: Home / Self Care | Payer: Medicaid Other | Attending: Emergency Medicine | Admitting: Emergency Medicine

## 2018-07-14 DIAGNOSIS — I10 Essential (primary) hypertension: Secondary | ICD-10-CM | POA: Insufficient documentation

## 2018-07-14 DIAGNOSIS — M5430 Sciatica, unspecified side: Secondary | ICD-10-CM

## 2018-07-14 DIAGNOSIS — M5432 Sciatica, left side: Secondary | ICD-10-CM

## 2018-07-14 DIAGNOSIS — F17210 Nicotine dependence, cigarettes, uncomplicated: Secondary | ICD-10-CM | POA: Insufficient documentation

## 2018-07-14 DIAGNOSIS — Z79899 Other long term (current) drug therapy: Secondary | ICD-10-CM | POA: Insufficient documentation

## 2018-07-14 MED ORDER — PREDNISONE 10 MG PO TABS: ORAL_TABLET | ORAL | 0 refills | Status: DC

## 2018-07-14 MED ORDER — KETOROLAC TROMETHAMINE 60 MG/2ML IM SOLN
60.0000 mg | Freq: Once | INTRAMUSCULAR | Status: AC
  Administered 2018-07-14: 60 mg via INTRAMUSCULAR
  Filled 2018-07-14: qty 2

## 2018-07-14 MED ORDER — METHOCARBAMOL 500 MG PO TABS: 500.0000 mg | ORAL_TABLET | Freq: Two times a day (BID) | ORAL | 0 refills | Status: DC

## 2018-07-14 MED ORDER — METHYLPREDNISOLONE SODIUM SUCC 125 MG IJ SOLR
125.0000 mg | Freq: Once | INTRAMUSCULAR | Status: AC
  Administered 2018-07-14: 125 mg via INTRAMUSCULAR
  Filled 2018-07-14: qty 2

## 2018-07-14 NOTE — ED Notes (Signed)
Patient verbalizes understanding of discharge instructions. Opportunity for questioning and answers were provided. Armband removed by staff, pt discharged from ED.  

## 2018-07-14 NOTE — ED Triage Notes (Addendum)
Left side back x 3 days  Has sciatica denies dysuria

## 2018-07-14 NOTE — ED Provider Notes (Signed)
[facility] EMERGENCY DEPARTMENT Provider Note   CSN: [id number] Arrival date & time: 07/14/18  1443    History   Chief Complaint Chief Complaint  Patient presents with  . Back Pain    HPI Dorothy Meadows is a 33 y.o. female.     The history is provided by the patient. No language interpreter was used.  Back Pain  Location:  Lumbar spine Quality:  Aching Radiates to:  L thigh Pain severity:  Moderate Pain is:  Same all the time Timing:  Constant Progression:  Worsening Chronicity:  New Relieved by:  Nothing Worsened by:  Nothing Ineffective treatments:  None tried Associated symptoms: no fever   Pt reports she has had sciatica in the past and this is the same.  Pain with sitting  Past Medical History:  Diagnosis Date  . Genital warts   . Gestational diabetes    first pregnancy  . Gonorrhea    1st trimester this pregnancy  . Hyperlipidemia   . Hypertension   . No pertinent past medical history   . SVT (supraventricular tachycardia) [facility])     Patient Active Problem List   Diagnosis Date Noted  . SVT (supraventricular tachycardia) (HCC) 02/18/2014  . Cigarette smoker one half pack a day or less 12/19/2012  . GERD (gastroesophageal reflux disease) 11/14/2012    Past Surgical History:  Procedure Laterality Date  . removal of warts       OB History    Gravida  4   Para  3   Term  3   Preterm  0   AB  1   Living  3     SAB  1   TAB  0   Ectopic  0   Multiple  0   Live Births  3            Home Medications    Prior to Admission medications   Medication Sig Start Date End Date Taking? Authorizing Provider  atenolol (TENORMIN) 25 MG tablet Take 1 tablet (25 mg total) by mouth daily. 03/27/16   Dettinger, Elige Radon, MD  clindamycin (CLEOCIN) 150 MG capsule Take 3 capsules (450 mg total) by mouth 3 (three) times daily. 09/16/17   Muthersbaugh, Dahlia Client, PA-C  etonogestrel (NEXPLANON) 68 MG IMPL implant 1 each by  Subdermal route once. Inserted on 04/12/2016    [provider]  HYDROcodone-acetaminophen (NORCO/VICODIN) 5-325 MG tablet Take 2 tablets by mouth every 6 (six) hours as needed. 07/07/17   Muthersbaugh, Dahlia Client, PA-C  ibuprofen (ADVIL,MOTRIN) 800 MG tablet Take 1 tablet (800 mg total) by mouth 3 (three) times daily. 09/16/17   Muthersbaugh, Dahlia Client, PA-C  lidocaine (LIDODERM) 5 % Place 1 patch onto the skin daily. Remove & Discard patch within 12 hours or as directed by MD 04/17/17   Antony Madura, PA-C  methocarbamol (ROBAXIN) 500 MG tablet Take 1 tablet (500 mg total) by mouth 2 (two) times daily. 07/07/17   Muthersbaugh, Dahlia Client, PA-C  Multiple Vitamins-Minerals (MULTIVITAMIN GUMMIES WOMENS PO) Take by mouth.    [provider]  naproxen (NAPROSYN) 500 MG tablet Take 1 po BID with food prn pain 10/09/17   Devoria Albe, MD  omeprazole (PRILOSEC) 20 MG capsule Take 1 capsule (20 mg total) by mouth daily as needed (for acid reflux). 03/27/16   Dettinger, Elige Radon, MD  predniSONE (DELTASONE) 20 MG tablet 2 tabs x 3 days, then 1.5 tabs x 3 days, then 1 tab x 3 days, then  0.5 tabs x 3 days 07/07/17   Muthersbaugh, Dahlia Client, PA-C    Family History Family History  Problem Relation Age of Onset  . Diabetes Mother   . Hypertension Mother   . Depression Mother   . Anxiety disorder Mother   . Diabetes Maternal Grandmother   . Hypertension Maternal Grandmother   . Heart disease Maternal Grandmother        30s    Social History Social History   Tobacco Use  . Smoking status: Current Every Day Smoker    Packs/day: 0.50    Years: 15.00    Pack years: 7.50    Types: Cigarettes  . Smokeless tobacco: Never Used  Substance Use Topics  . Alcohol use: Yes    Comment: occasionally, birthdays and get togethers  . Drug use: Yes    Types: Marijuana    Comment: occ daily 1-2      Allergies   Patient has no known allergies.   Review of Systems Review of Systems  Constitutional: Negative for  fever.  Musculoskeletal: Positive for back pain.  All other systems reviewed and are negative.    Physical Exam Updated Vital Signs BP (!) 142/86 (BP Location: Right Arm)   Pulse 71   Temp 98.3 F (36.8 C) (Oral)   Resp 18   SpO2 100%   Physical Exam Vitals signs and nursing note reviewed.  Constitutional:      Appearance: She is well-developed.  HENT:     Head: Normocephalic.  Neck:     Musculoskeletal: Normal range of motion.  Cardiovascular:     Rate and Rhythm: Normal rate.  Pulmonary:     Effort: Pulmonary effort is normal.  Abdominal:     General: There is no distension.  Musculoskeletal: Normal range of motion.     Comments: Tender left sciatic notch,  Pain with movement  No rash  Skin:    General: Skin is warm.  Neurological:     General: No focal deficit present.     Mental Status: She is alert and oriented to person, place, and time.  Psychiatric:        Mood and Affect: Mood normal.      ED Treatments / Results  Labs (all labs ordered are listed, but only abnormal results are displayed) Labs Reviewed - No data to display  EKG None  Radiology No results found.  Procedures Procedures (including critical care time)  Medications Ordered in ED Medications - No data to display   Initial Impression / Assessment and Plan / ED Course  I have reviewed the triage vital signs and the nursing notes.  Pertinent labs & imaging results that were available during my care of the patient were reviewed by me and considered in my medical decision making (see chart for details).        MDM  Pt given Solumedrol and Torodol,  Rx for robaxin and prednisone   Final Clinical Impressions(s) / ED Diagnoses   Final diagnoses:  Sciatica, unspecified laterality  Sciatica of left side    ED Discharge Orders         Ordered    predniSONE (DELTASONE) 10 MG tablet     07/14/18 1530    methocarbamol (ROBAXIN) 500 MG tablet  2 times daily     07/14/18 1530         An After Visit Summary was printed and given to the patient.    Elson Areas, [place] 07/14/18 1531  Arby Barrette, MD 07/14/18 [phone]

## 2018-07-14 NOTE — Discharge Instructions (Signed)
Return if any problems.

## 2018-11-22 ENCOUNTER — Encounter (HOSPITAL_COMMUNITY): Payer: Self-pay

## 2018-11-22 ENCOUNTER — Emergency Department (HOSPITAL_COMMUNITY)
Admission: EM | Admit: 2018-11-22 | Discharge: 2018-11-22 | Disposition: A | Discharge status: Home / Self Care | Payer: Self-pay | Attending: Emergency Medicine | Admitting: Emergency Medicine

## 2018-11-22 ENCOUNTER — Other Ambulatory Visit: Payer: Self-pay

## 2018-11-22 ENCOUNTER — Emergency Department (HOSPITAL_COMMUNITY): Payer: Self-pay

## 2018-11-22 DIAGNOSIS — Z79899 Other long term (current) drug therapy: Secondary | ICD-10-CM | POA: Insufficient documentation

## 2018-11-22 DIAGNOSIS — M25512 Pain in left shoulder: Secondary | ICD-10-CM | POA: Insufficient documentation

## 2018-11-22 DIAGNOSIS — F17210 Nicotine dependence, cigarettes, uncomplicated: Secondary | ICD-10-CM | POA: Insufficient documentation

## 2018-11-22 DIAGNOSIS — M5432 Sciatica, left side: Secondary | ICD-10-CM | POA: Insufficient documentation

## 2018-11-22 DIAGNOSIS — I10 Essential (primary) hypertension: Secondary | ICD-10-CM | POA: Insufficient documentation

## 2018-11-22 DIAGNOSIS — M62838 Other muscle spasm: Secondary | ICD-10-CM | POA: Insufficient documentation

## 2018-11-22 LAB — BASIC METABOLIC PANEL
Anion gap: 6 (ref 5–15)
BUN: 8 mg/dL (ref 6–20)
CO2: 24 mmol/L (ref 22–32)
Calcium: 8.7 mg/dL — ABNORMAL LOW (ref 8.9–10.3)
Chloride: 108 mmol/L (ref 98–111)
Creatinine, Ser: 0.71 mg/dL (ref 0.44–1.00)
GFR calc Af Amer: >60 mL/min (ref >60)
GFR calc non Af Amer: >60 mL/min (ref >60)
Glucose, Bld: 89 mg/dL (ref 70–99)
Potassium: 3.9 mmol/L (ref 3.5–5.1)
Sodium: 138 mmol/L (ref 135–145)

## 2018-11-22 LAB — CBC
HCT: 39.4 % (ref 36.0–46.0)
Hemoglobin: 12.9 g/dL (ref 12.0–15.0)
MCH: 30.4 pg (ref 26.0–34.0)
MCHC: 32.7 g/dL (ref 30.0–36.0)
MCV: 92.7 fL (ref 80.0–100.0)
Platelets: 194 10*3/uL (ref 150–400)
RBC: 4.25 MIL/uL (ref 3.87–5.11)
RDW: 13.9 % (ref 11.5–15.5)
WBC: 9.3 10*3/uL (ref 4.0–10.5)
nRBC: 0 % (ref 0.0–0.2)

## 2018-11-22 LAB — TROPONIN I (HIGH SENSITIVITY): Troponin I (High Sensitivity): <2 ng/L (ref <18)

## 2018-11-22 LAB — I-STAT BETA HCG BLOOD, ED (MC, WL, AP ONLY): I-stat hCG, quantitative: <5 m[IU]/mL (ref <5)

## 2018-11-22 MED ORDER — CELECOXIB 200 MG PO CAPS: 200.0000 mg | ORAL_CAPSULE | Freq: Two times a day (BID) | ORAL | 0 refills | Status: DC | PRN

## 2018-11-22 MED ORDER — SODIUM CHLORIDE 0.9% FLUSH: 3.0000 mL | Freq: Once | INTRAVENOUS | Status: DC

## 2018-11-22 MED ORDER — BACLOFEN 10 MG PO TABS: 10.0000 mg | ORAL_TABLET | Freq: Three times a day (TID) | ORAL | 0 refills | Status: DC

## 2018-11-22 MED ORDER — HYDROCODONE-ACETAMINOPHEN 5-325 MG PO TABS
1.0000 | ORAL_TABLET | Freq: Once | ORAL | Status: AC
  Administered 2018-11-22: 1 via ORAL
  Filled 2018-11-22: qty 1

## 2018-11-22 MED ORDER — TRAMADOL HCL 50 MG PO TABS: 50.0000 mg | ORAL_TABLET | Freq: Four times a day (QID) | ORAL | 0 refills | Status: DC | PRN

## 2018-11-22 MED ORDER — ONDANSETRON 4 MG PO TBDP
4.0000 mg | ORAL_TABLET | Freq: Once | ORAL | Status: AC
  Administered 2018-11-22: 4 mg via ORAL
  Filled 2018-11-22: qty 1

## 2018-11-22 NOTE — ED Provider Notes (Signed)
[facility]-EMERGENCY DEPT Provider Note   CSN: [id number] Arrival date & time: 11/22/18  1151     History   Chief Complaint Chief Complaint  Patient presents with  . Back Pain  . Chest Pain    HPI Dorothy Meadows is a 33 y.o. female.  Who presents emergency department with chief complaint of left shoulder region pain.  The patient states that her symptoms began about a month ago with left-sided sciatica which she has had recurrent self-limited bouts of in the past.  Patient states that she thinks that because she has been compensating for her symptoms in the left leg her entire left side has been hyperreactive and irritable.  She states that today she had onset of severe pain in the left shoulder blade region and left pectoralis region.  She says that as long as she is still the pain is better but as soon as she tries to sit up, change position or use her left arm she gets severe pain in the musculature by her left shoulder blade and has some mild pain in the antagonist musculature of the pectoralis region.  The patient states it is worse when she tries to take a very deep breath.  Worse when she tries to twist or move her thorax.  She denies cough, shortness of breath, unilateral leg swelling, hemoptysis, history of DVT or PE.  She is a smoker but she denies use of any exogenous estrogens, recent confinement or surgeries.    HPI  Past Medical History:  Diagnosis Date  . Genital warts   . Gestational diabetes    first pregnancy  . Gonorrhea    1st trimester this pregnancy  . Hyperlipidemia   . Hypertension   . No pertinent past medical history   . SVT (supraventricular tachycardia) [facility](HCC)     Patient Active Problem List   Diagnosis Date Noted  . SVT (supraventricular tachycardia) (HCC) 02/18/2014  . Cigarette smoker one half pack a day or less 12/19/2012  . GERD (gastroesophageal reflux disease) 11/14/2012    Past Surgical History:  Procedure Laterality  Date  . removal of warts       OB History    Gravida  4   Para  3   Term  3   Preterm  0   AB  1   Living  3     SAB  1   TAB  0   Ectopic  0   Multiple  0   Live Births  3            Home Medications    Prior to Admission medications   Medication Sig Start Date End Date Taking? Authorizing Provider  etonogestrel (NEXPLANON) 68 MG IMPL implant 1 each by Subdermal route once. Inserted on 04/12/2016   Yes [provider]  Menthol-Camphor (ICY HOT ADVANCED RELIEF) 16-11 % CREA Apply 1 application topically as needed (pain).   Yes [provider]  atenolol (TENORMIN) 25 MG tablet Take 1 tablet (25 mg total) by mouth daily. Patient not taking: Reported on 11/22/2018 03/27/16   Dettinger, Elige RadonJoshua A, MD  clindamycin (CLEOCIN) 150 MG capsule Take 3 capsules (450 mg total) by mouth 3 (three) times daily. Patient not taking: Reported on 11/22/2018 09/16/17   Muthersbaugh, Dahlia ClientHannah, PA-C  HYDROcodone-acetaminophen (NORCO/VICODIN) 5-325 MG tablet Take 2 tablets by mouth every 6 (six) hours as needed. Patient not taking: Reported on 11/22/2018 07/07/17   Muthersbaugh, Dahlia ClientHannah, PA-C  ibuprofen (ADVIL,MOTRIN)  800 MG tablet Take 1 tablet (800 mg total) by mouth 3 (three) times daily. Patient not taking: Reported on 11/22/2018 09/16/17   Muthersbaugh, Dahlia ClientHannah, PA-C  lidocaine (LIDODERM) 5 % Place 1 patch onto the skin daily. Remove & Discard patch within 12 hours or as directed by MD Patient not taking: Reported on 11/22/2018 04/17/17   Antony MaduraHumes, Kelly, PA-C  methocarbamol (ROBAXIN) 500 MG tablet Take 1 tablet (500 mg total) by mouth 2 (two) times daily. Patient not taking: Reported on 11/22/2018 07/14/18   Elson AreasSofia, Leslie K, PA-C  naproxen (NAPROSYN) 500 MG tablet Take 1 po BID with food prn pain Patient not taking: Reported on 11/22/2018 10/09/17   Devoria AlbeKnapp, Iva, MD  omeprazole (PRILOSEC) 20 MG capsule Take 1 capsule (20 mg total) by mouth daily as needed (for acid reflux). Patient  not taking: Reported on 11/22/2018 03/27/16   Dettinger, Elige RadonJoshua A, MD  predniSONE (DELTASONE) 10 MG tablet 6,5,4,3,2,1, taper Patient not taking: Reported on 11/22/2018 07/14/18   Elson AreasSofia, Leslie K, PA-C    Family History Family History  Problem Relation Age of Onset  . Diabetes Mother   . Hypertension Mother   . Depression Mother   . Anxiety disorder Mother   . Diabetes Maternal Grandmother   . Hypertension Maternal Grandmother   . Heart disease Maternal Grandmother        6870s    Social History Social History   Tobacco Use  . Smoking status: Current Every Day Smoker    Packs/day: 0.50    Years: 15.00    Pack years: 7.50    Types: Cigarettes  . Smokeless tobacco: Never Used  Substance Use Topics  . Alcohol use: Yes    Comment: occasionally, birthdays and get togethers  . Drug use: Yes    Types: Marijuana    Comment: occ daily 1-2      Allergies   Patient has no known allergies.   Review of Systems Review of Systems Ten systems reviewed and are negative for acute change, except as noted in the HPI.   Physical Exam Updated Vital Signs BP 122/75   Pulse 66   Temp 98.6 F (37 C) (Oral)   Resp 17   Ht 5\' 9"  (1.753 m)   Wt 108.9 kg   SpO2 100%   BMI 35.44 kg/m   Physical Exam Vitals signs and nursing note reviewed.  Constitutional:      General: She is not in acute distress.    Appearance: She is well-developed. She is not diaphoretic.  HENT:     Head: Normocephalic and atraumatic.  Eyes:     General: No scleral icterus.    Conjunctiva/sclera: Conjunctivae normal.  Neck:     Musculoskeletal: Normal range of motion.  Cardiovascular:     Rate and Rhythm: Normal rate and regular rhythm.     Heart sounds: Normal heart sounds. No murmur. No friction rub. No gallop.   Pulmonary:     Effort: Pulmonary effort is normal. No respiratory distress.     Breath sounds: Normal breath sounds.  Chest:     Chest wall: Tenderness present.    Abdominal:     General:  Bowel sounds are normal. There is no distension.     Palpations: Abdomen is soft. There is no mass.     Tenderness: There is no abdominal tenderness. There is no guarding.  Musculoskeletal:     Thoracic back: She exhibits tenderness, pain and spasm.       Back:  Skin:    General: Skin is warm and dry.  Neurological:     Mental Status: She is alert and oriented to person, place, and time.  Psychiatric:        Behavior: Behavior normal.      ED Treatments / Results  Labs (all labs ordered are listed, but only abnormal results are displayed) Labs Reviewed  BASIC METABOLIC PANEL - Abnormal; Notable for the following components:      Result Value   Calcium 8.7 (*)    All other components within normal limits  CBC  I-STAT BETA HCG BLOOD, ED ([facility], [facility], [facility] ONLY)  TROPONIN I (HIGH SENSITIVITY)  TROPONIN I (HIGH SENSITIVITY)    EKG None  Radiology Dg Chest 2 View  Result Date: 11/22/2018 CLINICAL DATA:  Chest pain. EXAM: CHEST - 2 VIEW COMPARISON:  Radiograph July 26, 2015. FINDINGS: The heart size and mediastinal contours are within normal limits. Both lungs are clear. No pneumothorax or pleural effusion is noted. The visualized skeletal structures are unremarkable. IMPRESSION: No active cardiopulmonary disease. Electronically Signed   By: Marijo Conception M.D.   On: 11/22/2018 13:00    Procedures Procedures (including critical care time)  Medications Ordered in ED Medications  sodium chloride flush (NS) 0.9 % injection 3 mL (has no administration in time range)  HYDROcodone-acetaminophen (NORCO/VICODIN) 5-325 MG per tablet 1 tablet (has no administration in time range)  ondansetron (ZOFRAN-ODT) disintegrating tablet 4 mg (has no administration in time range)     Initial Impression / Assessment and Plan / ED Course  I have reviewed the triage vital signs and the nursing notes.  Pertinent labs & imaging results that were available during my care of the patient were reviewed  by me and considered in my medical decision making (see chart for details).        33 year old female with reproducible left shoulder spasm, pain in the left pectoralis, pain with movement of the left shoulder.  This appears to be acute still spasm of the left shoulder region particularly in the latissimus region.  This is likely secondary to biomechanical dysfunction from her left sciatica which has been ongoing for some time.  I doubt any other cause of her pain such as ACS or pulmonary embolus.  She is PERC negative.  Respiratory rate slightly elevated at 22 but this is secondary to tearfulness.  I have advised heat and ice, home supportive care, outpatient resources with physical therapy or chiropractic adjustment when she is not so inflamed and tender.  Patient also given tramadol.  I did review the patient on the PDMP prior to prescribing this medication.  I discussed return precautions and outpatient follow-up.  I personally reviewed the patient's labs which show no leukocytosis, negative pregnancy, negative troponin, slightly low calcium, I personally reviewed the patient's 2 view chest x-ray which shows no acute abnormalities.  EKG shows normal sinus rhythm at a rate of 73 bpm.  Patient appears appropriate for discharge at this time.  Discussed return precautions.  Final Clinical Impressions(s) / ED Diagnoses   Final diagnoses:  None    ED Discharge Orders    None       Margarita Mail, PA-C 11/22/18 1555    Daleen Bo, MD 11/22/18 1650

## 2018-11-22 NOTE — Discharge Instructions (Addendum)
Do not drink alcohol with the tramadol as this can cause you to have a seizure. Do not drive,swim take a bath or make important decisions if you take the baclofen or tramadol.  Return for any new or worsening symptoms.  Look up self treatment with trigger point therapy.

## 2018-11-22 NOTE — ED Triage Notes (Signed)
Pt reports that she has been having sciatica pain, and now having left sided upper back pain that radiates to left arm/shulder and left side of chest  10/10 sharp. Pt reports that she has been taking medication OTC for pain but has been non effective. Pt deep reports that when sh take a deep breath she has pain in her chest.

## 2019-05-26 ENCOUNTER — Ambulatory Visit (INDEPENDENT_AMBULATORY_CARE_PROVIDER_SITE_OTHER): Payer: Self-pay | Admitting: General Practice

## 2019-05-26 ENCOUNTER — Other Ambulatory Visit: Payer: Self-pay

## 2019-05-26 DIAGNOSIS — N926 Irregular menstruation, unspecified: Secondary | ICD-10-CM

## 2019-05-26 DIAGNOSIS — R3915 Urgency of urination: Secondary | ICD-10-CM

## 2019-05-26 DIAGNOSIS — R300 Dysuria: Secondary | ICD-10-CM

## 2019-05-26 DIAGNOSIS — Z3202 Encounter for pregnancy test, result negative: Secondary | ICD-10-CM

## 2019-05-26 DIAGNOSIS — R350 Frequency of micturition: Secondary | ICD-10-CM

## 2019-05-26 LAB — POCT URINALYSIS DIP (DEVICE)
Bilirubin Urine: NEGATIVE
Glucose, UA: NEGATIVE mg/dL
Ketones, ur: NEGATIVE mg/dL
Leukocytes,Ua: NEGATIVE
Nitrite: NEGATIVE
Protein, ur: NEGATIVE mg/dL
Specific Gravity, Urine: >=1.03 (ref 1.005–1.030)
Urobilinogen, UA: 1 mg/dL (ref 0.0–1.0)
pH: 6 (ref 5.0–8.0)

## 2019-05-26 LAB — POCT PREGNANCY, URINE: Preg Test, Ur: NEGATIVE

## 2019-05-26 MED ORDER — NITROFURANTOIN MONOHYD MACRO 100 MG PO CAPS: 100.0000 mg | ORAL_CAPSULE | Freq: Two times a day (BID) | ORAL | 0 refills | Status: AC

## 2019-05-26 NOTE — Progress Notes (Signed)
Patient seen and assessed by nursing staff during this encounter. I have reviewed the chart and agree with the documentation and plan.  Jaynie Collins, MD 05/26/2019 1:11 PM

## 2019-05-26 NOTE — Progress Notes (Signed)
Patient presents to office today reporting constant fullness of the bladder, urgency, increased urination, & pain after urination for the past week. She reports she has been taking Azo and her symptoms will go away for a few days but then come right back. Patient states she is late for her period which was supposed to come on the 1st. She has Nexplanon in place that expired in December. She gets regular monthly periods up until now. UPT is negative today and UA reveals trace blood but is otherwise negative. Discussed with Dr Macon Large who states patient should be treated for UTI with Macrobid 100mg  BID x 3 days with urine culture sent. Rx sent to pharmacy. Discussed with patient & informed her results will be available via mychart. Patient verbalized understanding.   RN BSN 05/26/19

## 2019-05-28 ENCOUNTER — Other Ambulatory Visit: Payer: Self-pay | Admitting: Obstetrics & Gynecology

## 2019-05-28 ENCOUNTER — Telehealth: Payer: Self-pay | Admitting: Lactation Services

## 2019-05-28 DIAGNOSIS — N3000 Acute cystitis without hematuria: Secondary | ICD-10-CM

## 2019-05-28 LAB — URINE CULTURE

## 2019-05-28 MED ORDER — CEFADROXIL 500 MG PO CAPS: 500.0000 mg | ORAL_CAPSULE | Freq: Two times a day (BID) | ORAL | 0 refills | Status: AC

## 2019-05-28 NOTE — Telephone Encounter (Signed)
Called pt and when calling mobile number received a message that call could not be completed Called home number and LM for pt to go pick up prescription at her pharmacy. Will send My Chart Message.

## 2019-05-28 NOTE — Telephone Encounter (Signed)
-----   Message from Tereso Newcomer, MD sent at 05/28/2019  3:11 PM EST ----- Patient was given Macrobid which treated with E.coli but may need additional therapy for the GBS.  Duricef x 3 days prescribed.  Please tell patient to pick up prescription as take as directed; this is for the second organism found on her culture and will also help further treat with E.coli too.

## 2019-06-12 ENCOUNTER — Encounter: Payer: Self-pay | Admitting: Obstetrics & Gynecology

## 2019-06-12 ENCOUNTER — Other Ambulatory Visit: Payer: Self-pay

## 2019-06-12 ENCOUNTER — Ambulatory Visit (INDEPENDENT_AMBULATORY_CARE_PROVIDER_SITE_OTHER): Payer: Self-pay | Admitting: Obstetrics & Gynecology

## 2019-06-12 VITALS — BP 129/85 | HR 78 | Ht 69.0 in | Wt 222.6 lb

## 2019-06-12 DIAGNOSIS — Z3046 Encounter for surveillance of implantable subdermal contraceptive: Secondary | ICD-10-CM

## 2019-06-12 MED ORDER — NORGESTIMATE-ETH ESTRADIOL 0.25-35 MG-MCG PO TABS: 1.0000 | ORAL_TABLET | Freq: Every day | ORAL | 11 refills | Status: DC

## 2019-06-12 NOTE — Progress Notes (Signed)
GYNECOLOGY OFFICE PROCEDURE NOTE  Dorothy Meadows is a 34 y.o. [phone] here for Nexplanon removal.  Last pap smear was on 2014 and was normal.  No other gynecologic concerns.  [name] identified, informed consent performed, consent signed.   Appropriate time out taken. Nexplanon site identified.  Area prepped in usual sterile fashon. One ml of 1% lidocaine was used to anesthetize the area at the distal end of the implant. A small stab incision was made right beside the implant on the distal portion.  The Nexplanon rod was grasped using hemostats and removed without difficulty.  There was minimal blood loss. There were no complications.  3 ml of 1% lidocaine was injected around the incision for post-procedure analgesia.  Steri-strips were applied over the small incision.  A pressure bandage was applied to reduce any bruising.  The patient tolerated the procedure well and was given post procedure instructions.  Patient is planning to use OCP for contraception/attempt conception.    Adam Phenix, MD Attending Obstetrician & Gynecologist, [facility]  06/12/2019

## 2019-06-30 ENCOUNTER — Ambulatory Visit: Payer: Self-pay | Admitting: Obstetrics & Gynecology

## 2019-08-04 ENCOUNTER — Emergency Department (HOSPITAL_COMMUNITY)
Admission: EM | Admit: 2019-08-04 | Discharge: 2019-08-04 | Disposition: A | Discharge status: Home / Self Care | Payer: Self-pay | Attending: Emergency Medicine | Admitting: Emergency Medicine

## 2019-08-04 ENCOUNTER — Encounter (HOSPITAL_COMMUNITY): Payer: Self-pay

## 2019-08-04 ENCOUNTER — Other Ambulatory Visit: Payer: Self-pay

## 2019-08-04 DIAGNOSIS — M7918 Myalgia, other site: Secondary | ICD-10-CM | POA: Insufficient documentation

## 2019-08-04 DIAGNOSIS — I10 Essential (primary) hypertension: Secondary | ICD-10-CM | POA: Insufficient documentation

## 2019-08-04 DIAGNOSIS — Z79899 Other long term (current) drug therapy: Secondary | ICD-10-CM | POA: Insufficient documentation

## 2019-08-04 DIAGNOSIS — J020 Streptococcal pharyngitis: Secondary | ICD-10-CM | POA: Insufficient documentation

## 2019-08-04 DIAGNOSIS — F17210 Nicotine dependence, cigarettes, uncomplicated: Secondary | ICD-10-CM | POA: Insufficient documentation

## 2019-08-04 DIAGNOSIS — Z20822 Contact with and (suspected) exposure to covid-19: Secondary | ICD-10-CM | POA: Insufficient documentation

## 2019-08-04 LAB — GROUP A STREP BY PCR: Group A Strep by PCR: DETECTED — AB

## 2019-08-04 LAB — POC URINE PREG, ED: Preg Test, Ur: NEGATIVE

## 2019-08-04 LAB — POC SARS CORONAVIRUS 2 AG -  ED: SARS Coronavirus 2 Ag: NEGATIVE

## 2019-08-04 MED ORDER — ACETAMINOPHEN 500 MG PO TABS
1000.0000 mg | ORAL_TABLET | Freq: Once | ORAL | Status: AC
  Administered 2019-08-04: 10:00:00 1000 mg via ORAL
  Filled 2019-08-04: qty 2

## 2019-08-04 MED ORDER — PENICILLIN V POTASSIUM 500 MG PO TABS
500.0000 mg | ORAL_TABLET | Freq: Once | ORAL | Status: AC
  Administered 2019-08-04: 12:00:00 500 mg via ORAL
  Filled 2019-08-04: qty 1

## 2019-08-04 MED ORDER — PENICILLIN V POTASSIUM 500 MG PO TABS: 500.0000 mg | ORAL_TABLET | Freq: Two times a day (BID) | ORAL | 0 refills | Status: AC

## 2019-08-04 NOTE — ED Provider Notes (Signed)
[facility] DEPT Provider Note   CSN: [id number] Arrival date & time: 08/04/19  0941     History Chief Complaint  Patient presents with  . Fever  . Generalized Body Aches    Dorothy Meadows is a 34 y.o. female.  HPI 34 year old female presents with fever and sore throat.  Sore throat started first last evening then had fever throughout the night.  Did not take her temperature.  Also with body aches.  No headache, cough, vomiting, or abdominal pain.  She also notes that she may or may not be pregnant, does not use birth control but also has not missed her menstrual cycle.  Painful swallowing.  Has not taken any meds.  She feels like her left neck is swollen.  Also pain into her left ear.   Past Medical History:  Diagnosis Date  . Genital warts   . Gestational diabetes    first pregnancy  . Gonorrhea    1st trimester this pregnancy  . Hyperlipidemia   . Hypertension   . No pertinent past medical history   . SVT (supraventricular tachycardia) [facility])     Patient Active Problem List   Diagnosis Date Noted  . SVT (supraventricular tachycardia) ([place]) 02/18/2014  . Cigarette smoker one half pack a day or less 12/19/2012  . GERD (gastroesophageal reflux disease) 11/14/2012    Past Surgical History:  Procedure Laterality Date  . removal of warts       OB History    Gravida  4   Para  3   Term  3   Preterm  0   AB  1   Living  3     SAB  1   TAB  0   Ectopic  0   Multiple  0   Live Births  3           Family History  Problem Relation Age of Onset  . Diabetes Mother   . Hypertension Mother   . Depression Mother   . Anxiety disorder Mother   . Diabetes Maternal Grandmother   . Hypertension Maternal Grandmother   . Heart disease Maternal Grandmother        23s    Social History   Tobacco Use  . Smoking status: Current Every Day Smoker    Packs/day: 0.50    Years: 15.00    Pack years: 7.50    Types: Cigarettes   . Smokeless tobacco: Never Used  Substance Use Topics  . Alcohol use: Yes    Comment: occasionally, birthdays and get togethers  . Drug use: Yes    Types: Marijuana    Comment: occ daily 1-2     Home Medications Prior to Admission medications   Medication Sig Start Date End Date Taking? Authorizing Provider  baclofen (LIORESAL) 10 MG tablet Take 1 tablet (10 mg total) by mouth 3 (three) times daily. Patient not taking: Reported on 06/12/2019 11/22/18   Margarita Mail, PA-C  celecoxib (CELEBREX) 200 MG capsule Take 1 capsule (200 mg total) by mouth 2 (two) times daily as needed (Pain). Patient not taking: Reported on 06/12/2019 11/22/18   Margarita Mail, PA-C  etonogestrel (NEXPLANON) 68 MG IMPL implant 1 each by Subdermal route once. Inserted on 04/12/2016    [provider]  Menthol-Camphor (ICY HOT ADVANCED RELIEF) 16-11 % CREA Apply 1 application topically as needed (pain).    [provider]  norgestimate-ethinyl estradiol (ORTHO-CYCLEN) 0.25-35 MG-MCG tablet Take 1 tablet by mouth  daily. 06/12/19   Adam Phenix, MD  penicillin v potassium (VEETID) 500 MG tablet Take 1 tablet (500 mg total) by mouth in the morning and at bedtime for 10 days. 08/04/19 08/14/19  Pricilla Loveless, MD  traMADol (ULTRAM) 50 MG tablet Take 1 tablet (50 mg total) by mouth every 6 (six) hours as needed. Patient not taking: Reported on 06/12/2019 11/22/18   Arthor Captain, PA-C    Allergies    Patient has no known allergies.  Review of Systems   Review of Systems  Constitutional: Positive for fever.  HENT: Positive for ear pain and sore throat. Negative for congestion and trouble swallowing.   Respiratory: Negative for cough and shortness of breath.   Gastrointestinal: Negative for vomiting.  Musculoskeletal: Positive for myalgias.  All other systems reviewed and are negative.   Physical Exam Updated Vital Signs BP (!) 143/85 (BP Location: Left Arm)   Pulse (!) 112   Temp (!) 100.9  F (38.3 C) (Oral)   Resp 18   LMP 07/13/2019 (Approximate)   SpO2 99%   Physical Exam Vitals and nursing note reviewed.  Constitutional:      Appearance: She is well-developed.  HENT:     Head: Normocephalic and atraumatic.     Right Ear: Tympanic membrane and external ear normal.     Left Ear: Tympanic membrane and external ear normal.     Nose: Nose normal.     Mouth/Throat:     Pharynx: Uvula midline. Posterior oropharyngeal erythema present. No oropharyngeal exudate or uvula swelling.     Tonsils: No tonsillar abscesses.  Eyes:     General:        Right eye: No discharge.        Left eye: No discharge.  Neck:     Comments: No appreciable neck swelling Cardiovascular:     Rate and Rhythm: Normal rate and regular rhythm.     Heart sounds: Normal heart sounds.  Pulmonary:     Effort: Pulmonary effort is normal.     Breath sounds: Normal breath sounds.  Abdominal:     General: There is no distension.     Palpations: Abdomen is soft.     Tenderness: There is no abdominal tenderness.  Musculoskeletal:     Cervical back: Normal range of motion and neck supple. Tenderness (left anterior neck just inferior to mandible. no obvious lymphadenopathy) present. No rigidity.  Skin:    General: Skin is warm and dry.  Neurological:     Mental Status: She is alert.  Psychiatric:        Mood and Affect: Mood is not anxious.     ED Results / Procedures / Treatments   Labs (all labs ordered are listed, but only abnormal results are displayed) Labs Reviewed  GROUP A STREP BY PCR - Abnormal; Notable for the following components:      Result Value   Group A Strep by PCR DETECTED (*)    All other components within normal limits  POC SARS CORONAVIRUS 2 AG -  ED  POC URINE PREG, ED    EKG None  Radiology No results found.  Procedures Procedures (including critical care time)  Medications Ordered in ED Medications  penicillin v potassium (VEETID) tablet 500 mg (has no  administration in time range)  acetaminophen (TYLENOL) tablet 1,000 mg (1,000 mg Oral Given 08/04/19 1024)    ED Course  I have reviewed the triage vital signs and the nursing notes.  Pertinent labs &  imaging results that were available during my care of the patient were reviewed by me and considered in my medical decision making (see chart for details).    MDM Rules/Calculators/A&P                      Patient is well-appearing.  Her strep test is positive.  Will treat for strep with penicillin x10 days.  Otherwise, she reports some neck swelling but probably is having painful lymphadenopathy.  I do not see any obvious swelling and highly doubt deep space neck infection.  We discussed return precautions and will discharge with antibiotics and recommend Tylenol and ibuprofen.  Covid antigen testing is negative but given a more likely diagnosis of strep, I do not think PCR testing is needed.  Zacaria LILLYMAE DUET was evaluated in Emergency Department on 08/04/2019 for the symptoms described in the history of present illness. She was evaluated in the context of the global COVID-19 pandemic, which necessitated consideration that the patient might be at risk for infection with the SARS-CoV-2 virus that causes COVID-19. Institutional protocols and algorithms that pertain to the evaluation of patients at risk for COVID-19 are in a state of rapid change based on information released by regulatory bodies including the CDC and federal and state organizations. These policies and algorithms were followed during the patient's care in the ED.  Final Clinical Impression(s) / ED Diagnoses Final diagnoses:  Strep pharyngitis    Rx / DC Orders ED Discharge Orders         Ordered    penicillin v potassium (VEETID) 500 MG tablet  2 times daily     08/04/19 1114           Pricilla Loveless, MD 08/04/19 1134

## 2019-08-04 NOTE — ED Triage Notes (Signed)
Pt presents with c/o generalized body aches and fever. Pt reports she also has a sore throat that started yesterday. Pt reports the fever started overnight. Pt denies any known Covid contacts.

## 2019-08-04 NOTE — Discharge Instructions (Addendum)
If you develop severe neck pain or sore throat, vomiting, trouble breathing, you do not improve with antibiotics, or you develop any other new/concerning symptoms, then return to the ER for evaluation

## 2020-01-15 ENCOUNTER — Emergency Department (HOSPITAL_COMMUNITY)
Admission: EM | Admit: 2020-01-15 | Discharge: 2020-01-15 | Disposition: A | Discharge status: Home / Self Care | Payer: Medicaid Other | Attending: Emergency Medicine | Admitting: Emergency Medicine

## 2020-01-15 ENCOUNTER — Encounter (HOSPITAL_COMMUNITY): Payer: Self-pay

## 2020-01-15 ENCOUNTER — Other Ambulatory Visit: Payer: Self-pay

## 2020-01-15 DIAGNOSIS — Z5321 Procedure and treatment not carried out due to patient leaving prior to being seen by health care provider: Secondary | ICD-10-CM | POA: Insufficient documentation

## 2020-01-15 DIAGNOSIS — M546 Pain in thoracic spine: Secondary | ICD-10-CM | POA: Insufficient documentation

## 2020-01-15 DIAGNOSIS — M545 Low back pain: Secondary | ICD-10-CM | POA: Insufficient documentation

## 2020-01-15 NOTE — ED Triage Notes (Signed)
Patient states she was moving furnture 2 days ago and is now having right mid and lower back pain.   Patient states, "I think I am pregnant." Patient states she did 2 home pregnancy tests and both were +

## 2020-01-15 NOTE — ED Notes (Signed)
Pt called x3. Not responding.

## 2020-02-16 ENCOUNTER — Other Ambulatory Visit: Payer: Self-pay

## 2020-02-16 ENCOUNTER — Inpatient Hospital Stay (HOSPITAL_COMMUNITY)
Admission: AD | Admit: 2020-02-16 | Discharge: 2020-02-17 | Disposition: A | Discharge status: Home / Self Care | Payer: Medicaid Other | Attending: Obstetrics & Gynecology | Admitting: Obstetrics & Gynecology

## 2020-02-16 ENCOUNTER — Encounter (HOSPITAL_COMMUNITY): Payer: Self-pay | Admitting: Obstetrics & Gynecology

## 2020-02-16 DIAGNOSIS — O26892 Other specified pregnancy related conditions, second trimester: Secondary | ICD-10-CM | POA: Diagnosis not present

## 2020-02-16 DIAGNOSIS — F17210 Nicotine dependence, cigarettes, uncomplicated: Secondary | ICD-10-CM | POA: Insufficient documentation

## 2020-02-16 DIAGNOSIS — O10912 Unspecified pre-existing hypertension complicating pregnancy, second trimester: Secondary | ICD-10-CM | POA: Insufficient documentation

## 2020-02-16 DIAGNOSIS — M25562 Pain in left knee: Secondary | ICD-10-CM | POA: Diagnosis not present

## 2020-02-16 DIAGNOSIS — E785 Hyperlipidemia, unspecified: Secondary | ICD-10-CM | POA: Insufficient documentation

## 2020-02-16 DIAGNOSIS — O99332 Smoking (tobacco) complicating pregnancy, second trimester: Secondary | ICD-10-CM | POA: Diagnosis not present

## 2020-02-16 DIAGNOSIS — Z79899 Other long term (current) drug therapy: Secondary | ICD-10-CM | POA: Diagnosis not present

## 2020-02-16 DIAGNOSIS — W19XXXA Unspecified fall, initial encounter: Secondary | ICD-10-CM

## 2020-02-16 DIAGNOSIS — Z3A14 14 weeks gestation of pregnancy: Secondary | ICD-10-CM | POA: Insufficient documentation

## 2020-02-16 DIAGNOSIS — M7918 Myalgia, other site: Secondary | ICD-10-CM | POA: Insufficient documentation

## 2020-02-16 DIAGNOSIS — R109 Unspecified abdominal pain: Secondary | ICD-10-CM | POA: Diagnosis not present

## 2020-02-16 DIAGNOSIS — O99891 Other specified diseases and conditions complicating pregnancy: Secondary | ICD-10-CM | POA: Diagnosis not present

## 2020-02-16 DIAGNOSIS — O26891 Other specified pregnancy related conditions, first trimester: Secondary | ICD-10-CM | POA: Diagnosis present

## 2020-02-16 LAB — URINALYSIS, ROUTINE W REFLEX MICROSCOPIC
Bilirubin Urine: NEGATIVE
Glucose, UA: NEGATIVE mg/dL
Hgb urine dipstick: NEGATIVE
Ketones, ur: NEGATIVE mg/dL
Leukocytes,Ua: NEGATIVE
Nitrite: NEGATIVE
Protein, ur: NEGATIVE mg/dL
Specific Gravity, Urine: 1.025 (ref 1.005–1.030)
pH: 6 (ref 5.0–8.0)

## 2020-02-16 LAB — POCT PREGNANCY, URINE: Preg Test, Ur: POSITIVE — AB

## 2020-02-16 MED ORDER — ACETAMINOPHEN 500 MG PO TABS
1000.0000 mg | ORAL_TABLET | Freq: Four times a day (QID) | ORAL | Status: DC | PRN
  Administered 2020-02-17: 1000 mg via ORAL
  Filled 2020-02-16: qty 2

## 2020-02-16 NOTE — MAU Provider Note (Addendum)
History     CSN: [id number]  Arrival date and time: 02/16/20 2306   First Provider Initiated Contact with Patient 02/16/20 2347      Chief Complaint  Patient presents with  . Abdominal Pain  . Vaginal Bleeding   34 y.o. [id number] @14 .1 weeks presenting with LAP, spotting, and knee pain after falling. Reports falling going upstairs carrying groceries, missed the step. Landed on her left knee and abdomen. Fall happened yesterday. Started having LAP (mostly on left side) and spotting last night. Rates pain 10/10. Pain is worse with movement. Also having left knee pain and pain in right pinky toe. She took Tylenol at the time which helped. Has not taken any today. Reports a white vaginal discharge, no malodor or itching. Has not started prenatal care yet.   OB History    Gravida  5   Para  3   Term  3   Preterm  0   AB  1   Living  3     SAB  1   TAB  0   Ectopic  0   Multiple  0   Live Births  3           Past Medical History:  Diagnosis Date  . Genital warts   . Gestational diabetes    first pregnancy  . Gonorrhea    1st trimester this pregnancy  . Hyperlipidemia   . Hypertension   . No pertinent past medical history   . SVT (supraventricular tachycardia) (HCC)     Past Surgical History:  Procedure Laterality Date  . removal of warts      Family History  Problem Relation Age of Onset  . Diabetes Mother   . Hypertension Mother   . Depression Mother   . Anxiety disorder Mother   . Diabetes Maternal Grandmother   . Hypertension Maternal Grandmother   . Heart disease Maternal Grandmother        16s    Social History   Tobacco Use  . Smoking status: Current Every Day Smoker    Packs/day: 0.50    Years: 15.00    Pack years: 7.50    Types: Cigarettes  . Smokeless tobacco: Never Used  Vaping Use  . Vaping Use: Never used  Substance Use Topics  . Alcohol use: Not Currently  . Drug use: Yes    Types: Marijuana    Allergies: No Known  Allergies  Medications Prior to Admission  Medication Sig Dispense Refill Last Dose  . Prenatal Vit-Fe Fumarate-FA (PRENATAL MULTIVITAMIN) TABS tablet Take 1 tablet by mouth daily at 12 noon.   02/16/2020 at Unknown time  . baclofen (LIORESAL) 10 MG tablet Take 1 tablet (10 mg total) by mouth 3 (three) times daily. (Patient not taking: Reported on 06/12/2019) 30 each 0   . celecoxib (CELEBREX) 200 MG capsule Take 1 capsule (200 mg total) by mouth 2 (two) times daily as needed (Pain). (Patient not taking: Reported on 06/12/2019) 30 capsule 0   . etonogestrel (NEXPLANON) 68 MG IMPL implant 1 each by Subdermal route once. Inserted on 04/12/2016     . Menthol-Camphor (ICY HOT ADVANCED RELIEF) 16-11 % CREA Apply 1 application topically as needed (pain).     . norgestimate-ethinyl estradiol (ORTHO-CYCLEN) 0.25-35 MG-MCG tablet Take 1 tablet by mouth daily. 1 Package 11   . traMADol (ULTRAM) 50 MG tablet Take 1 tablet (50 mg total) by mouth every 6 (six) hours as needed. (Patient not taking: Reported on 06/12/2019)  15 tablet 0     Review of Systems  Gastrointestinal: Positive for abdominal pain.  Genitourinary: Positive for vaginal bleeding. Negative for vaginal discharge.  Skin:       Bruise, below left knee   Physical Exam   Blood pressure 128/82, pulse 91, temperature 98.4 F (36.9 C), resp. rate 16, height 5\' 9"  (1.753 m), weight 99.7 kg, last menstrual period 11/09/2019, SpO2 100 %.  Physical Exam Vitals and nursing note reviewed. Exam conducted with a chaperone present.  Constitutional:      General: She is not in acute distress.    Appearance: Normal appearance.  HENT:     Head: Normocephalic and atraumatic.  Cardiovascular:     Rate and Rhythm: Normal rate.  Pulmonary:     Effort: Pulmonary effort is normal. No respiratory distress.  Abdominal:     General: There is no distension.     Palpations: Abdomen is soft. There is no mass.     Tenderness: There is no abdominal tenderness.  There is no guarding or rebound.  Genitourinary:    Comments: External: no lesions or erythema Vagina: rugated, pink, moist, thick white discharge, no blood Cervix closed/long  Musculoskeletal:        General: Normal range of motion.     Cervical back: Normal range of motion.     Right knee: Normal.     Left knee: Ecchymosis present. No swelling, deformity, erythema or lacerations. Normal range of motion. No tenderness.       Legs:  Skin:    General: Skin is warm and dry.     Findings: Bruising (inferior to left knee) present.     Comments: Right pinky toe slightly edematous  Neurological:     General: No focal deficit present.     Mental Status: She is alert and oriented to person, place, and time.  Psychiatric:        Mood and Affect: Mood normal.        Behavior: Behavior normal.   FHT 166  Results for orders placed or performed during the hospital encounter of 02/16/20 (from the past 24 hour(s))  Urinalysis, Routine w reflex microscopic     Status: Abnormal   Collection Time: 02/16/20 11:24 PM  Result Value Ref Range   Color, Urine YELLOW YELLOW   APPearance HAZY (A) CLEAR   Specific Gravity, Urine 1.025 1.005 - 1.030   pH 6.0 5.0 - 8.0   Glucose, UA NEGATIVE NEGATIVE mg/dL   Hgb urine dipstick NEGATIVE NEGATIVE   Bilirubin Urine NEGATIVE NEGATIVE   Ketones, ur NEGATIVE NEGATIVE mg/dL   Protein, ur NEGATIVE NEGATIVE mg/dL   Nitrite NEGATIVE NEGATIVE   Leukocytes,Ua NEGATIVE NEGATIVE  Pregnancy, urine POC     Status: Abnormal   Collection Time: 02/16/20 11:26 PM  Result Value Ref Range   Preg Test, Ur POSITIVE (A) NEGATIVE  Wet prep, genital     Status: Abnormal   Collection Time: 02/16/20 11:45 PM   Specimen: Vaginal  Result Value Ref Range   Yeast Wet Prep HPF POC NONE SEEN NONE SEEN   Trich, Wet Prep NONE SEEN NONE SEEN   Clue Cells Wet Prep HPF POC PRESENT (A) NONE SEEN   WBC, Wet Prep HPF POC MANY (A) NONE SEEN   Sperm NONE SEEN    MAU Course    Procedures Tylenol  MDM Labs ordered and reviewed. Pain consistent with MSK, reduced after rest and Tylenol. No signs of SAB, pt reassured. No signs of  fracture or MSK injury. Stable for discharge home.    Assessment and Plan   1. [redacted] weeks gestation of pregnancy   2. Fall, initial encounter   3. Musculoskeletal pain    Discharge home Follow up with OB provider of choice to start care SAB precautions Tylenol prn Tub soaks/shower prn comfort  Allergies as of 02/17/2020   No Known Allergies     Medication List    STOP taking these medications   baclofen 10 MG tablet Commonly known as: LIORESAL   celecoxib 200 MG capsule Commonly known as: CeleBREX   Nexplanon 68 MG Impl implant Generic drug: etonogestrel   norgestimate-ethinyl estradiol 0.25-35 MG-MCG tablet Commonly known as: ORTHO-CYCLEN   traMADol 50 MG tablet Commonly known as: ULTRAM     TAKE these medications   Icy Hot Advanced Relief 16-11 % Crea Generic drug: Menthol-Camphor Apply 1 application topically as needed (pain).   prenatal multivitamin Tabs tablet Take 1 tablet by mouth daily at 12 noon.      Donette Larry, CNM 02/17/2020, 12:55 AM

## 2020-02-16 NOTE — MAU Note (Signed)
PT SAYS [facility]ENT TO [facility]- POSITIVE PREG TEST. - EDC WAS 08-15-2020.   YESTERDAY - FELL  UP THE STEPS - TOE/ KNEE / HIPS HURT.  SHARP PAIN IN LOWER ABD .

## 2020-02-17 DIAGNOSIS — O99891 Other specified diseases and conditions complicating pregnancy: Secondary | ICD-10-CM

## 2020-02-17 DIAGNOSIS — W19XXXA Unspecified fall, initial encounter: Secondary | ICD-10-CM

## 2020-02-17 DIAGNOSIS — R109 Unspecified abdominal pain: Secondary | ICD-10-CM

## 2020-02-17 DIAGNOSIS — Z3A14 14 weeks gestation of pregnancy: Secondary | ICD-10-CM

## 2020-02-17 DIAGNOSIS — M25562 Pain in left knee: Secondary | ICD-10-CM

## 2020-02-17 LAB — WET PREP, GENITAL
Sperm: NONE SEEN
Trich, Wet Prep: NONE SEEN
Yeast Wet Prep HPF POC: NONE SEEN

## 2020-02-17 LAB — GC/CHLAMYDIA PROBE AMP (~~LOC~~) NOT AT ARMC
Chlamydia: NEGATIVE
Comment: NEGATIVE
Comment: NORMAL
Neisseria Gonorrhea: NEGATIVE

## 2020-02-17 NOTE — Discharge Instructions (Signed)
Abdominal Pain During Pregnancy  Belly (abdominal) pain is common during pregnancy. There are many possible causes. Most of the time, it is not a serious problem. Other times, it can be a sign that something is wrong with the pregnancy. Always tell your doctor if you have belly pain. Follow these instructions at home:  Do not have sex or put anything in your vagina until your pain goes away completely.  Get plenty of rest until your pain gets better.  Drink enough fluid to keep your pee (urine) pale yellow.  Take over-the-counter and prescription medicines only as told by your doctor.  Keep all follow-up visits as told by your doctor. This is important. Contact a doctor if:  Your pain continues or gets worse after resting.  You have lower belly pain that: ? Comes and goes at regular times. ? Spreads to your back. ? Feels like menstrual cramps.  You have pain or burning when you pee (urinate). Get help right away if:  You have a fever or chills.  You have vaginal bleeding.  You are leaking fluid from your vagina.  You are passing tissue from your vagina.  You throw up (vomit) for more than 24 hours.  You have watery poop (diarrhea) for more than 24 hours.  Your baby is moving less than usual.  You feel very weak or faint.  You have shortness of breath.  You have very bad pain in your upper belly. Summary  Belly (abdominal) pain is common during pregnancy. There are many possible causes.  If you have belly pain during pregnancy, tell your doctor right away.  Keep all follow-up visits as told by your doctor. This is important. This information is not intended to replace advice given to you by your health care provider. Make sure you discuss any questions you have with your health care provider. Document Revised: 08/05/2018 Document Reviewed: 07/20/2016 Elsevier Patient Education  2020 Elsevier Inc.   Round Ligament Pain  The round ligament is a cord of muscle  and tissue that helps support the uterus. It can become a source of pain during pregnancy if it becomes stretched or twisted as the baby grows. The pain usually begins in the second trimester (13-28 weeks) of pregnancy, and it can come and go until the baby is delivered. It is not a serious problem, and it does not cause harm to the baby. Round ligament pain is usually a short, sharp, and pinching pain, but it can also be a dull, lingering, and aching pain. The pain is felt in the lower side of the abdomen or in the groin. It usually starts deep in the groin and moves up to the outside of the hip area. The pain may occur when you:  Suddenly change position, such as quickly going from a sitting to standing position.  Roll over in bed.  Cough or sneeze.  Do physical activity. Follow these instructions at home:   Watch your condition for any changes.  When the pain starts, relax. Then try any of these methods to help with the pain: ? Sitting down. ? Flexing your knees up to your abdomen. ? Lying on your side with one pillow under your abdomen and another pillow between your legs. ? Sitting in a warm bath for 15-20 minutes or until the pain goes away.  Take over-the-counter and prescription medicines only as told by your health care provider.  Move slowly when you sit down or stand up.  Avoid long walks if  they cause pain.  Stop or reduce your physical activities if they cause pain.  Keep all follow-up visits as told by your health care provider. This is important. Contact a health care provider if:  Your pain does not go away with treatment.  You feel pain in your back that you did not have before.  Your medicine is not helping. Get help right away if:  You have a fever or chills.  You develop uterine contractions.  You have vaginal bleeding.  You have nausea or vomiting.  You have diarrhea.  You have pain when you urinate. Summary  Round ligament pain is felt in the  lower abdomen or groin. It is usually a short, sharp, and pinching pain. It can also be a dull, lingering, and aching pain.  This pain usually begins in the second trimester (13-28 weeks). It occurs because the uterus is stretching with the growing baby, and it is not harmful to the baby.  You may notice the pain when you suddenly change position, when you cough or sneeze, or during physical activity.  Relaxing, flexing your knees to your abdomen, lying on one side, or taking a warm bath may help to get rid of the pain.  Get help from your health care provider if the pain does not go away or if you have vaginal bleeding, nausea, vomiting, diarrhea, or painful urination. This information is not intended to replace advice given to you by your health care provider. Make sure you discuss any questions you have with your health care provider. Document Revised: 10/03/2017 Document Reviewed: 10/03/2017 Elsevier Patient Education  2020 Elsevier Inc.   Preventing Injuries During Pregnancy  Injuries can happen during pregnancy. Minor falls and accidents usually do not harm you or your baby. But some injuries can harm you and your baby. Tell your doctor about any injury you suffer. What can I do to avoid injuries? Safety  Remove rugs and loose objects on the floor.  Wear comfortable shoes that have a good grip. Do not wear shoes that have high heels.  Always wear your seat belt in the car. The lap belt should be below your belly. Always drive safely.  Do not ride on a motorcycle. Activity  Do not take part in rough and violent activities or sports.  Avoid: ? Walking on wet or slippery floors. ? Lifting heavy pots of boiling or hot liquids. ? Fixing electrical problems. ? Being near fires. General instructions  Take over-the-counter and prescription medicines only as told by your doctor.  Know your blood type and the blood type of the baby's father.  If you are a victim of domestic  violence: ? Call your local emergency services (911 in the [place] ? Contact the [employer] Violence Hotline for help and support. Get help right away if:  You fall on your belly or receive any serious blow to your belly.  You have a stiff neck or neck pain after a fall or an injury.  You get a headache or have problems with vision after an injury.  You do not feel the baby move or the baby is not moving as much as normal.  You have been a victim of domestic violence or any other kind of attack.  You have been in a car accident.  You have bleeding from your vagina.  Fluid is leaking from your vagina.  You start to have cramping or pain in your belly (contractions).  You have very bad pain in your  lower back.  You feel weak or pass out (faint).  You start to throw up (vomit) after an injury.  You have been burned. Summary  Some injuries that happen during pregnancy can do harm to the baby.  Tell your doctor about any injury.  Take steps to avoid injury. This includes removing rugs and loose objects on the floor. Always wear your seat belt in the car.  Do not take part in rough and violent activities or sports.  Get help right away if you have any serious accident or injury. This information is not intended to replace advice given to you by your health care provider. Make sure you discuss any questions you have with your health care provider. Document Revised: 01/10/2019 Document Reviewed: 04/26/2016 Elsevier Patient Education  2020 [employer].

## 2020-03-05 ENCOUNTER — Other Ambulatory Visit (HOSPITAL_COMMUNITY)
Admission: RE | Admit: 2020-03-05 | Discharge: 2020-03-05 | Disposition: A | Discharge status: Home / Self Care | Payer: Medicaid Other | Source: Ambulatory Visit | Attending: Advanced Practice Midwife | Admitting: Advanced Practice Midwife

## 2020-03-05 ENCOUNTER — Ambulatory Visit (INDEPENDENT_AMBULATORY_CARE_PROVIDER_SITE_OTHER): Payer: Medicaid Other | Admitting: Advanced Practice Midwife

## 2020-03-05 ENCOUNTER — Other Ambulatory Visit: Payer: Self-pay

## 2020-03-05 VITALS — BP 132/71 | HR 94 | Wt 213.0 lb

## 2020-03-05 DIAGNOSIS — O0990 Supervision of high risk pregnancy, unspecified, unspecified trimester: Secondary | ICD-10-CM | POA: Diagnosis present

## 2020-03-05 DIAGNOSIS — Z3A16 16 weeks gestation of pregnancy: Secondary | ICD-10-CM | POA: Insufficient documentation

## 2020-03-05 DIAGNOSIS — O0992 Supervision of high risk pregnancy, unspecified, second trimester: Secondary | ICD-10-CM | POA: Diagnosis not present

## 2020-03-05 DIAGNOSIS — O09522 Supervision of elderly multigravida, second trimester: Secondary | ICD-10-CM | POA: Insufficient documentation

## 2020-03-05 MED ORDER — ASPIRIN EC 81 MG PO TBEC: 81.0000 mg | DELAYED_RELEASE_TABLET | Freq: Every day | ORAL | 5 refills | Status: DC

## 2020-03-05 NOTE — Progress Notes (Signed)
Subjective:   Dorothy Meadows is a 34 y.o. [id number] at 110w5d by LMP being seen today for her first obstetrical visit.  Her obstetrical history is significant for advanced maternal age and Hx NSVD x 3 and has GERD (gastroesophageal reflux disease); Cigarette smoker one half pack a day or less; SVT (supraventricular tachycardia) (HCC); Supervision of high risk pregnancy, antepartum; and Advanced maternal age in multigravida, second trimester on their problem list.. Patient does intend to breast feed. Pregnancy history fully reviewed.  Patient reports nausea.  HISTORY: OB History  Gravida Para Term Preterm AB Living  5 3 3  0 1 3  SAB TAB Ectopic Multiple Live Births  1 0 0 0 3    # Outcome Date GA Lbr Len/2nd Weight Sex Delivery Anes PTL Lv  5 Current           4 Term 01/03/13 [redacted]w[redacted]d 03:05 / 00:09 6 lb 8.4 oz (2.96 kg) F Vag-Spont EPI  LIV     Name: Dorothy Meadows     Apgar1: 9  Apgar5: 10  3 Term 05/29/07 [redacted]w[redacted]d  6 lb 10 oz (3.005 kg) M Vag-Spont EPI  LIV     Birth Comments: gestational diabetes   2 Term 06/24/04 [redacted]w[redacted]d  6 lb 8 oz (2.948 kg) M Vag-Vacuum EPI  LIV  1 SAB            Past Medical History:  Diagnosis Date  . Genital warts   . Gestational diabetes    first pregnancy  . Gonorrhea    1st trimester this pregnancy  . Hyperlipidemia   . Hypertension   . No pertinent past medical history   . SVT (supraventricular tachycardia) (HCC)    Past Surgical History:  Procedure Laterality Date  . removal of warts     Family History  Problem Relation Age of Onset  . Diabetes Mother   . Hypertension Mother   . Depression Mother   . Anxiety disorder Mother   . Diabetes Maternal Grandmother   . Hypertension Maternal Grandmother   . Heart disease Maternal Grandmother        5s   Social History   Tobacco Use  . Smoking status: Current Every Day Smoker    Packs/day: 0.50    Years: 15.00    Pack years: 7.50    Types: Cigarettes  . Smokeless tobacco: Never Used    Vaping Use  . Vaping Use: Never used  Substance Use Topics  . Alcohol use: Not Currently  . Drug use: Yes    Types: Marijuana   No Known Allergies Current Outpatient Medications on File Prior to Visit  Medication Sig Dispense Refill  . Prenatal Vit-Fe Fumarate-FA (PRENATAL MULTIVITAMIN) TABS tablet Take 1 tablet by mouth daily at 12 noon.     No current facility-administered medications on file prior to visit.     Indications for ASA therapy (per uptodate) One of the following: Previous pregnancy with preeclampsia, especially early onset and with an adverse outcome No Multifetal gestation No Chronic hypertension No Type 1 or 2 diabetes mellitus No Chronic kidney disease No Autoimmune disease (antiphospholipid syndrome, systemic lupus erythematosus) No   Two or more of the following: Nulliparity No Obesity (body mass index >30 kg/m2) Yes Family history of preeclampsia in mother or sister No Age ?35 years Yes Sociodemographic characteristics (African American race, low socioeconomic level) Yes Personal risk factors (eg, previous pregnancy with low birth weight or small for gestational age infant, previous adverse  pregnancy outcome [eg, stillbirth], interval >10 years between pregnancies) No   Indications for early 1 hour GTT (per uptodate)  BMI >25 (>23 in Asian women) AND one of the following  Gestational diabetes mellitus in a previous pregnancy Yes Glycated hemoglobin ?5.7 percent (39 mmol/mol), impaired glucose tolerance, or impaired fasting glucose on previous testing No First-degree relative with diabetes No High-risk race/ethnicity (eg, African American, Latino, Native American, [place] American, Pacific Islander) Yes History of cardiovascular disease No Hypertension or on therapy for hypertension No High-density lipoprotein cholesterol level <35 mg/dL (4.09 mmol/L) and/or a triglyceride level >250 mg/dL (8.11 mmol/L) No Polycystic ovary syndrome No Physical  inactivity No Other clinical condition associated with insulin resistance (eg, severe obesity, acanthosis nigricans) No Previous birth of an infant weighing ?4000 g No Previous stillbirth of unknown cause No Exam   Vitals:   03/05/20 0957  BP: 132/71  Pulse: 94  Weight: 213 lb (96.6 kg)   Fetal Heart Rate (bpm): 162  Uterus:     Pelvic Exam: Perineum: no hemorrhoids, normal perineum   Vulva: normal external genitalia, no lesions   Vagina:  normal mucosa, normal discharge   Cervix: no lesions and normal, pap smear done.    Adnexa: normal adnexa and no mass, fullness, tenderness   Bony Pelvis: average  System: General: well-developed, well-nourished female in no acute distress   Breast:  normal appearance, no masses or tenderness   Skin: normal coloration and turgor, no rashes   Neurologic: oriented, normal, negative, normal mood   Extremities: normal strength, tone, and muscle mass, ROM of all joints is normal   HEENT PERRLA, extraocular movement intact and sclera clear, anicteric   Mouth/Teeth mucous membranes moist, pharynx normal without lesions and dental hygiene good   Neck supple and no masses   Cardiovascular: regular rate and rhythm   Respiratory:  no respiratory distress, normal breath sounds   Abdomen: soft, non-tender; bowel sounds normal; no masses,  no organomegaly     Assessment:   Pregnancy: [id number] Patient Active Problem List   Diagnosis Date Noted  . Supervision of high risk pregnancy, antepartum 03/05/2020  . Advanced maternal age in multigravida, second trimester 03/05/2020  . SVT (supraventricular tachycardia) (HCC) 02/18/2014  . Cigarette smoker one half pack a day or less 12/19/2012  . GERD (gastroesophageal reflux disease) 11/14/2012     Plan:  1. [redacted] weeks gestation of pregnancy  - Obstetric panel - HIV antibody (with reflex) - Hepatitis C Antibody - Culture, OB Urine - Urine cytology ancillary only([place]) - Genetic Screening - HgB  A1c - Alpha fetoprotein, maternal - [name] MFM OB COMP + 14 WK; Future - Hemoglobinopathy Evaluation  2. Supervision of high risk pregnancy, antepartum --Anticipatory guidance about next visits/weeks of pregnancy given. --Pt with some nausea but well managed, does not desire medications --Next visit in 4 weeks in office   Initial labs drawn. Continue prenatal vitamins. Discussed and offered genetic screening options, including Quad screen/AFP, NIPS testing, and option to decline testing. Benefits/risks/alternatives reviewed. Pt aware that anatomy US is form of genetic screening with lower accuracy in detecting trisomies than blood work.  Pt chooses genetic screening today. NIPS: ordered. Ultrasound discussed; fetal anatomic survey: ordered. Problem list reviewed and updated. The nature of  - [facility] with multiple MDs and other Advanced Practice Providers was explained to patient; also emphasized that residents, students are part of our team. Routine obstetric precautions reviewed. Return in about 4 weeks (around 04/02/2020).  Sharen Counter, CNM 03/05/20 11:41 AM

## 2020-03-05 NOTE — Patient Instructions (Signed)

## 2020-03-07 LAB — CULTURE, OB URINE

## 2020-03-07 LAB — URINE CULTURE, OB REFLEX: Organism ID, Bacteria: NO GROWTH

## 2020-03-08 LAB — OBSTETRIC PANEL
Absolute Monocytes: 1216 cells/uL — ABNORMAL HIGH (ref 200–950)
Antibody Screen: NOT DETECTED
Basophils Absolute: 48 cells/uL (ref 0–200)
Basophils Relative: 0.3 %
Eosinophils Absolute: 160 cells/uL (ref 15–500)
Eosinophils Relative: 1 %
HCT: 39.9 % (ref 35.0–45.0)
Hemoglobin: 13.8 g/dL (ref 11.7–15.5)
Hepatitis B Surface Ag: NONREACTIVE
Lymphs Abs: 3376 cells/uL (ref 850–3900)
MCH: 31.2 pg (ref 27.0–33.0)
MCHC: 34.6 g/dL (ref 32.0–36.0)
MCV: 90.3 fL (ref 80.0–100.0)
MPV: 11 fL (ref 7.5–12.5)
Monocytes Relative: 7.6 %
Neutro Abs: 11200 cells/uL — ABNORMAL HIGH (ref 1500–7800)
Neutrophils Relative %: 70 %
Platelets: 234 10*3/uL (ref 140–400)
RBC: 4.42 10*6/uL (ref 3.80–5.10)
RDW: 12.4 % (ref 11.0–15.0)
RPR Ser Ql: NONREACTIVE
Rubella: 3.92 Index
Total Lymphocyte: 21.1 %
WBC: 16 10*3/uL — ABNORMAL HIGH (ref 3.8–10.8)

## 2020-03-08 LAB — ALPHA FETOPROTEIN, MATERNAL
AFP MoM: 0.66
AFP, Serum: 23.3 ng/mL
Calc'd Gestational Age: 17.1 weeks
Maternal Wt: 213 [lb_av]
Risk for ONTD: <1
Twins-AFP: 1

## 2020-03-08 LAB — CYTOLOGY - PAP
Comment: NEGATIVE
Diagnosis: NEGATIVE
High risk HPV: NEGATIVE

## 2020-03-08 LAB — HEMOGLOBINOPATHY EVALUATION
Fetal Hemoglobin Testing: <1 % (ref 0.0–1.9)
HCT: 42.6 % (ref 35.0–45.0)
Hemoglobin A2 - HGBRFX: 2.7 % (ref 2.2–3.2)
Hemoglobin: 13.7 g/dL (ref 11.7–15.5)
Hgb A: 97.3 % (ref >96.0)
MCH: 29.9 pg (ref 27.0–33.0)
MCV: 93 fL (ref 80.0–100.0)
RBC: 4.58 10*6/uL (ref 3.80–5.10)
RDW: 12.7 % (ref 11.0–15.0)

## 2020-03-08 LAB — HEMOGLOBIN A1C
Hgb A1c MFr Bld: 5.3 % of total Hgb (ref <5.7)
Mean Plasma Glucose: 105 (calc)
eAG (mmol/L): 5.8 (calc)

## 2020-03-08 LAB — URINE CYTOLOGY ANCILLARY ONLY
Chlamydia: NEGATIVE
Comment: NEGATIVE
Comment: NORMAL
Neisseria Gonorrhea: NEGATIVE

## 2020-03-08 LAB — HIV ANTIBODY (ROUTINE TESTING W REFLEX): HIV 1&2 Ab, 4th Generation: NONREACTIVE

## 2020-03-08 LAB — HEPATITIS C ANTIBODY
Hepatitis C Ab: NONREACTIVE
SIGNAL TO CUT-OFF: 0.01 (ref <1.00)

## 2020-03-12 ENCOUNTER — Telehealth: Payer: Self-pay

## 2020-03-12 DIAGNOSIS — O0990 Supervision of high risk pregnancy, unspecified, unspecified trimester: Secondary | ICD-10-CM

## 2020-03-12 NOTE — Telephone Encounter (Signed)
Spoke with pt and she is aware of low risk panorama. Pt wants to pick up results in envelope to find out gender. Pt is aware we are waiting on [facility] Results.

## 2020-03-16 DIAGNOSIS — O0990 Supervision of high risk pregnancy, unspecified, unspecified trimester: Secondary | ICD-10-CM

## 2020-03-30 ENCOUNTER — Ambulatory Visit: Payer: Medicaid Other | Attending: Advanced Practice Midwife

## 2020-03-30 ENCOUNTER — Other Ambulatory Visit: Payer: Self-pay

## 2020-03-30 ENCOUNTER — Other Ambulatory Visit: Payer: Self-pay | Admitting: Advanced Practice Midwife

## 2020-03-30 DIAGNOSIS — Z3A16 16 weeks gestation of pregnancy: Secondary | ICD-10-CM | POA: Insufficient documentation

## 2020-03-30 DIAGNOSIS — Z363 Encounter for antenatal screening for malformations: Secondary | ICD-10-CM | POA: Diagnosis not present

## 2020-03-30 DIAGNOSIS — I471 Supraventricular tachycardia: Secondary | ICD-10-CM | POA: Insufficient documentation

## 2020-03-30 DIAGNOSIS — O0990 Supervision of high risk pregnancy, unspecified, unspecified trimester: Secondary | ICD-10-CM

## 2020-03-30 DIAGNOSIS — O09522 Supervision of elderly multigravida, second trimester: Secondary | ICD-10-CM | POA: Diagnosis not present

## 2020-03-30 DIAGNOSIS — O99412 Diseases of the circulatory system complicating pregnancy, second trimester: Secondary | ICD-10-CM | POA: Insufficient documentation

## 2020-03-31 ENCOUNTER — Other Ambulatory Visit: Payer: Self-pay | Admitting: *Deleted

## 2020-03-31 DIAGNOSIS — Z362 Encounter for other antenatal screening follow-up: Secondary | ICD-10-CM

## 2020-04-02 ENCOUNTER — Telehealth: Payer: Self-pay | Admitting: *Deleted

## 2020-04-02 ENCOUNTER — Encounter: Payer: Medicaid Other | Admitting: Certified Nurse Midwife

## 2020-04-02 NOTE — Telephone Encounter (Signed)
Left patient an urgent message to call and reschedule NO SHOW OB appointment on 04/02/2020 at 10:10 AM with a 9:55 AM arrival time.

## 2020-04-27 ENCOUNTER — Ambulatory Visit: Payer: Medicaid Other | Admitting: *Deleted

## 2020-04-27 ENCOUNTER — Ambulatory Visit: Payer: Medicaid Other | Attending: Obstetrics and Gynecology

## 2020-04-27 ENCOUNTER — Other Ambulatory Visit: Payer: Self-pay | Admitting: *Deleted

## 2020-04-27 ENCOUNTER — Encounter: Payer: Self-pay | Admitting: *Deleted

## 2020-04-27 ENCOUNTER — Other Ambulatory Visit: Payer: Self-pay

## 2020-04-27 VITALS — BP 121/70 | HR 92

## 2020-04-27 DIAGNOSIS — I471 Supraventricular tachycardia: Secondary | ICD-10-CM

## 2020-04-27 DIAGNOSIS — O09522 Supervision of elderly multigravida, second trimester: Secondary | ICD-10-CM

## 2020-04-27 DIAGNOSIS — Z6830 Body mass index (BMI) 30.0-30.9, adult: Secondary | ICD-10-CM

## 2020-04-27 DIAGNOSIS — Z362 Encounter for other antenatal screening follow-up: Secondary | ICD-10-CM | POA: Insufficient documentation

## 2020-04-27 DIAGNOSIS — Z3687 Encounter for antenatal screening for uncertain dates: Secondary | ICD-10-CM

## 2020-04-27 DIAGNOSIS — Z363 Encounter for antenatal screening for malformations: Secondary | ICD-10-CM

## 2020-04-27 DIAGNOSIS — O99412 Diseases of the circulatory system complicating pregnancy, second trimester: Secondary | ICD-10-CM

## 2020-04-27 DIAGNOSIS — Z3A20 20 weeks gestation of pregnancy: Secondary | ICD-10-CM

## 2020-06-22 ENCOUNTER — Encounter: Payer: Self-pay | Admitting: Certified Nurse Midwife

## 2020-06-22 ENCOUNTER — Other Ambulatory Visit: Payer: Self-pay

## 2020-06-22 ENCOUNTER — Ambulatory Visit (INDEPENDENT_AMBULATORY_CARE_PROVIDER_SITE_OTHER): Payer: Medicaid Other | Admitting: Certified Nurse Midwife

## 2020-06-22 VITALS — BP 138/90 | HR 77 | Wt 231.0 lb

## 2020-06-22 DIAGNOSIS — O1203 Gestational edema, third trimester: Secondary | ICD-10-CM

## 2020-06-22 DIAGNOSIS — O0990 Supervision of high risk pregnancy, unspecified, unspecified trimester: Secondary | ICD-10-CM

## 2020-06-22 DIAGNOSIS — O09522 Supervision of elderly multigravida, second trimester: Secondary | ICD-10-CM

## 2020-06-22 DIAGNOSIS — O09299 Supervision of pregnancy with other poor reproductive or obstetric history, unspecified trimester: Secondary | ICD-10-CM | POA: Insufficient documentation

## 2020-06-22 DIAGNOSIS — O0932 Supervision of pregnancy with insufficient antenatal care, second trimester: Secondary | ICD-10-CM | POA: Insufficient documentation

## 2020-06-22 DIAGNOSIS — Z8632 Personal history of gestational diabetes: Secondary | ICD-10-CM

## 2020-06-22 DIAGNOSIS — Z3A28 28 weeks gestation of pregnancy: Secondary | ICD-10-CM

## 2020-06-22 NOTE — Patient Instructions (Signed)
Oral Glucose Tolerance Test During Pregnancy Why am I having this test? The oral glucose tolerance test (OGTT) is done to check how your body processes blood sugar (glucose). This is one of several tests used to diagnose diabetes that develops during pregnancy (gestational diabetes mellitus). Gestational diabetes is a short-term form of diabetes that some women develop while they are pregnant. It usually occurs during the second trimester of pregnancy and goes away after delivery. Testing, or screening, for gestational diabetes usually occurs at weeks 24-28 of pregnancy. You may have the OGTT test after having a 1-hour glucose screening test if the results from that test indicate that you may have gestational diabetes. This test may also be needed if:  You have a history of gestational diabetes.  There is a history of giving birth to very large babies or of losing pregnancies (having stillbirths).  You have signs and symptoms of diabetes, such as: ? Changes in your eyesight. ? Tingling or numbness in your hands or feet. ? Changes in hunger, thirst, and urination, and these are not explained by your pregnancy. What is being tested? This test measures the amount of glucose in your blood at different times during a period of 3 hours. This shows how well your body can process glucose. What kind of sample is taken? Blood samples are required for this test. They are usually collected by inserting a needle into a blood vessel.   How do I prepare for this test?  For 3 days before your test, eat normally. Have plenty of carbohydrate-rich foods.  Follow instructions from your health care provider about: ? Eating or drinking restrictions on the day of the test. You may be asked not to eat or drink anything other than water (to fast) starting 8-10 hours before the test. ? Changing or stopping your regular medicines. Some medicines may interfere with this test. Tell a health care provider about:  All  medicines you are taking, including vitamins, herbs, eye drops, creams, and over-the-counter medicines.  Any blood disorders you have.  Any surgeries you have had.  Any medical conditions you have. What happens during the test? First, your blood glucose will be measured. This is referred to as your fasting blood glucose because you fasted before the test. Then, you will drink a glucose solution that contains a certain amount of glucose. Your blood glucose will be measured again 1, 2, and 3 hours after you drink the solution. This test takes about 3 hours to complete. You will need to stay at the testing location during this time. During the testing period:  Do not eat or drink anything other than the glucose solution.  Do not exercise.  Do not use any products that contain nicotine or tobacco, such as cigarettes, e-cigarettes, and chewing tobacco. These can affect your test results. If you need help quitting, ask your health care provider. The testing procedure may vary among health care providers and hospitals. How are the results reported? Your results will be reported as milligrams of glucose per deciliter of blood (mg/dL) or millimoles per liter (mmol/L). There is more than one source for screening and diagnosis reference values used to diagnose gestational diabetes. Your health care provider will compare your results to normal values that were established after testing a large group of people (reference values). Reference values may vary among labs and hospitals. For this test (Carpenter-Coustan), reference values are:  Fasting: 95 mg/dL (5.3 mmol/L).  1 hour: 180 mg/dL (10.0 mmol/L).  2 hour:   155 mg/dL (8.6 mmol/L).  3 hour: 140 mg/dL (7.8 mmol/L). What do the results mean? Results below the reference values are considered normal. If two or more of your blood glucose levels are at or above the reference values, you may be diagnosed with gestational diabetes. If only one level is  high, your health care provider may suggest repeat testing or other tests to confirm a diagnosis. Talk with your health care provider about what your results mean. Questions to ask your health care provider Ask your health care provider, or the department that is doing the test:  When will my results be ready?  How will I get my results?  What are my treatment options?  What other tests do I need?  What are my next steps? Summary  The oral glucose tolerance test (OGTT) is one of several tests used to diagnose diabetes that develops during pregnancy (gestational diabetes mellitus). Gestational diabetes is a short-term form of diabetes that some women develop while they are pregnant.  You may have the OGTT test after having a 1-hour glucose screening test if the results from that test show that you may have gestational diabetes. You may also have this test if you have any symptoms or risk factors for this type of diabetes.  Talk with your health care provider about what your results mean. This information is not intended to replace advice given to you by your health care provider. Make sure you discuss any questions you have with your health care provider. Document Revised: 09/25/2019 Document Reviewed: 09/25/2019 Elsevier Patient Education  2021 Elsevier Inc.  

## 2020-06-22 NOTE — Progress Notes (Signed)
Pt not doing GTT today- will schedule nurse visit

## 2020-06-22 NOTE — Progress Notes (Signed)
PRENATAL VISIT NOTE  Subjective:  Dorothy Meadows is a 35 y.o. [phone] at 74w4dbeing seen today for ongoing prenatal care.  She is currently monitored for the following issues for this high-risk pregnancy and has GERD (gastroesophageal reflux disease); Cigarette smoker one half pack a day or less; SVT (supraventricular tachycardia) ([place]; Supervision of high risk pregnancy, antepartum; Advanced maternal age in multigravida, second trimester; History of gestational diabetes in prior pregnancy, currently pregnant; and Limited prenatal care in second trimester on their problem list.  Patient reports no complaints.  Contractions: Not present. Vag. Bleeding: None.  Movement: Present. Denies leaking of fluid.   The following portions of the patient's history were reviewed and updated as appropriate: allergies, current medications, past family history, past medical history, past social history, past surgical history and problem list.   Objective:   Vitals:   06/22/20 1025  BP: 138/90  Pulse: 77  Weight: 231 lb (104.8 kg)    Fetal Status: Fetal Heart Rate (bpm): 146 Fundal Height: 33 cm Movement: Present     General:  Alert, oriented and cooperative. Patient is in no acute distress.  Skin: Skin is warm and dry. No rash noted.   Cardiovascular: Normal heart rate noted  Respiratory: Normal respiratory effort, no problems with respiration noted  Abdomen: Soft, gravid, appropriate for gestational age.  Pain/Pressure: Absent     Pelvic: Cervical exam deferred        Extremities: Normal range of motion.  Edema: None  Mental Status: Normal mood and affect. Normal behavior. Normal judgment and thought content.   Assessment and Plan:  Pregnancy: [id number] 232w4d. Supervision of high risk pregnancy, antepartum - Patient doing well, no complaints or concerns at this time  - Patient reports having some post coital bleeding but has doppler at home and monitors baby if it occurs - Routine  prenatal care - Anticipatory guidance on upcoming appointment, discussed with patient importance of doing GTT as soon as possible especially with hx of GDM in prior pregnancy  - FH large for gestational age- patient has follow up ultrasound scheduled for 3/22  2. History of gestational diabetes in prior pregnancy, currently pregnant - need GTT   3. Limited prenatal care in second trimester - Patient has not been seen for prenatal care since initial appointment in November, educated and discussed with patient importance of prenatal care and going to each appointment   4. Advanced maternal age in multigravida, second trimester - [id number] time of delivery   5. [redacted] weeks gestation of pregnancy  6. Edema during pregnancy in third trimester - Patient reports edema over the past 1-2 weeks, +1 pedal edema present  - Patient denies hx of elevated BP, BP today 138/90 - will obtain PEC labs  - Patient denies HA, patient reports one day of vision changes (floaters) 1 week ago - CBC - Comp Met (CMET) - Protein / creatinine ratio, urine  Preterm labor symptoms and general obstetric precautions including but not limited to vaginal bleeding, contractions, leaking of fluid and fetal movement were reviewed in detail with the patient. Please refer to After Visit Summary for other counseling recommendations.   Return in about 2 weeks (around 07/06/2020) for HROB, in person.  Future Appointments  Date Time Provider De[place]2/24/2022  9:00 AM [facility]  2:15 PM Constant, PeVickii ChafeMD [facility]-WKVA [facility]3/22/2022 10:30 AM [facility]-MFC NURSE [facility]3/22/2022 10:45 AM [facility] US4 [facility]  Lajean Manes, CNM

## 2020-06-23 LAB — COMPREHENSIVE METABOLIC PANEL
AG Ratio: 1 (calc) (ref 1.0–2.5)
ALT: 7 U/L (ref 6–29)
AST: 15 U/L (ref 10–30)
Albumin: 3.2 g/dL — ABNORMAL LOW (ref 3.6–5.1)
Alkaline phosphatase (APISO): 104 U/L (ref 31–125)
BUN: 7 mg/dL (ref 7–25)
CO2: 23 mmol/L (ref 20–32)
Calcium: 8.7 mg/dL (ref 8.6–10.2)
Chloride: 107 mmol/L (ref 98–110)
Creat: 0.5 mg/dL (ref 0.50–1.10)
Globulin: 3.1 g/dL (calc) (ref 1.9–3.7)
Glucose, Bld: 76 mg/dL (ref 65–139)
Potassium: 3.8 mmol/L (ref 3.5–5.3)
Sodium: 137 mmol/L (ref 135–146)
Total Bilirubin: 0.3 mg/dL (ref 0.2–1.2)
Total Protein: 6.3 g/dL (ref 6.1–8.1)

## 2020-06-23 LAB — CBC
HCT: 37 % (ref 35.0–45.0)
Hemoglobin: 12.4 g/dL (ref 11.7–15.5)
MCH: 30.8 pg (ref 27.0–33.0)
MCHC: 33.5 g/dL (ref 32.0–36.0)
MCV: 92 fL (ref 80.0–100.0)
MPV: 11.8 fL (ref 7.5–12.5)
Platelets: 216 10*3/uL (ref 140–400)
RBC: 4.02 10*6/uL (ref 3.80–5.10)
RDW: 13.2 % (ref 11.0–15.0)
WBC: 12.8 10*3/uL — ABNORMAL HIGH (ref 3.8–10.8)

## 2020-06-23 LAB — PROTEIN / CREATININE RATIO, URINE
Creatinine, Urine: 90 mg/dL (ref 20–275)
Protein/Creat Ratio: 89 mg/g creat (ref 21–161)
Protein/Creatinine Ratio: 0.089 mg/mg creat (ref 0.021–0.16)
Total Protein, Urine: 8 mg/dL (ref 5–24)

## 2020-06-24 ENCOUNTER — Other Ambulatory Visit (INDEPENDENT_AMBULATORY_CARE_PROVIDER_SITE_OTHER): Payer: Medicaid Other

## 2020-06-24 ENCOUNTER — Other Ambulatory Visit: Payer: Self-pay

## 2020-06-24 DIAGNOSIS — Z3480 Encounter for supervision of other normal pregnancy, unspecified trimester: Secondary | ICD-10-CM

## 2020-06-24 NOTE — Progress Notes (Signed)
Pt given orders and sent to lab for 28 wk labs

## 2020-06-25 LAB — 2HR GTT W 1 HR, CARPENTER, 75 G
Glucose, 1 Hr, Gest: 133 mg/dL (ref 65–179)
Glucose, 2 Hr, Gest: 67 mg/dL (ref 65–152)
Glucose, Fasting, Gest: 71 mg/dL (ref 65–91)

## 2020-06-25 LAB — RPR: RPR Ser Ql: NONREACTIVE

## 2020-06-25 LAB — HIV ANTIBODY (ROUTINE TESTING W REFLEX): HIV 1&2 Ab, 4th Generation: NONREACTIVE

## 2020-07-08 ENCOUNTER — Ambulatory Visit (INDEPENDENT_AMBULATORY_CARE_PROVIDER_SITE_OTHER): Payer: Medicaid Other | Admitting: Obstetrics and Gynecology

## 2020-07-08 ENCOUNTER — Other Ambulatory Visit: Payer: Self-pay

## 2020-07-08 VITALS — BP 154/95 | HR 81 | Wt 229.0 lb

## 2020-07-08 DIAGNOSIS — O0990 Supervision of high risk pregnancy, unspecified, unspecified trimester: Secondary | ICD-10-CM

## 2020-07-08 DIAGNOSIS — O09522 Supervision of elderly multigravida, second trimester: Secondary | ICD-10-CM

## 2020-07-08 NOTE — Progress Notes (Signed)
Bleeds after intercourse

## 2020-07-08 NOTE — Patient Instructions (Signed)
AREA PEDIATRIC/FAMILY PRACTICE PHYSICIANS  [place] ([address]) . [facility] o Chambliss, MD; Eniola, MD; Hale, MD; Hensel, MD; McDiarmid, MD; McIntyer, MD; Neal, MD; Walden, MD o [address] [place], [place] [address] o [phone] o Mon-Fri 8:30-12:30, 1:30-5:00 o Providers come to see babies at [facility] o Accepting Medicaid . [facility] o Limited providers who accept newborns: Koirala, MD; Morrow, MD; Wolters, MD o [address], [place], [place] [address] o [phone] o Mon-Fri 8:00-5:30 o Babies seen by providers at [facility] o Does NOT accept Medicaid o Please call early in hospitalization for appointment (limited availability)  . [facility] o Mulberry, MD o [address] [place], [place] [address] o [phone] o Mon, Tue, Thur, Fri 8:30-5:00, Wed 10:00-7:00 (closed 1-2pm) o Babies seen by [facility] providers o Accepting Medicaid . [facility] o Rubin, MD o [address], [place], [place] [address] o [phone] o Mon-Fri 8:30-5:00, Sat 8:30-12:00 o Provider comes to see babies at [facility] o Accepting Medicaid o Must have been referred from current patients or contacted office prior to delivery . [facility] ([facility]) o Brown, MD; Chandler, MD; Ettefagh, MD; Grant, MD; Lester, MD; McCormick, MD; McQueen, MD; Prose, MD; Simha, MD; Stanley, MD; Stryffeler, NP; Tebben, NP o [address], [place], [place] [address] o [phone] o Mon, Tue, Thur, Fri 8:30-5:30, Wed 9:30-5:30, Sat 8:30-12:30 o Babies seen by [facility] providers o Accepting Medicaid o Only accepting infants of first-time parents or siblings of current patients o Hospital discharge coordinator will make follow-up appointment . Jack Amos o [address],  [place], [place]  [address] o [phone]   Fax - [phone] . [facility] o [address], [place], [place]  [address] o Phone - [phone]   Fax - [phone] . Shilpa Gosrani o [address], [place], [place]  [address] o [phone]  [place] ([address]) . [facility] o Bates, MD; Brassfield, MD; Cooper, Cox, MD; MD; Davis, MD; Dovico, MD; Ettefaugh, MD; Little, MD; Lowe, MD; Keiffer, MD; Melvin, MD; Sumner, MD; Williams, MD o [address], [place], [place] [address] o [phone] o Mon-Fri 8:30-5:00 (extended evenings Mon-Thur as needed), Sat-Sun 10:00-1:00 o Providers come to see babies at [facility] o Accepting Medicaid for families of first-time babies and families with all children in the household age 3 and under. Must register with office prior to making appointment (M-F only). . [facility] o Henson, NP; Knapp, MD; Lalonde, MD; Tysinger, PA o [address] [place], [place] [address] o [phone] o Mon-Fri 8:00-5:00 o Babies seen by providers at [facility] o Does NOT accept Medicaid/Commercial Insurance Only . [facility] ([facility]  o Artis, MD; Barnes, MD; Bratton, MD; Coccaro, MD; Lockett Gardner, MD; Kramer, MD; Marshall, MD; Netherton, MD; Poleto, MD; Skinner, MD o [address] [place], [place] [address] o [phone] o Mon-Fri 8:30-5:30, Sat (Oct.-Mar.) 9:00-1:00 o Babies seen by providers at [facility] o Accepting Medicaid  [place] ([address]) . [facility] o Reid, MD; Warner, MD o [address], [place], [place] [address] o [phone] o Mon-Fri 8:30-5:00, Sat 8:30-12:00 o Providers come to see babies at [facility] o Does NOT accept Medicaid . [facility] o Becker, PA; Hagler, MD; Scifres, PA; Sun, MD; Swayne, MD o [address],  [place], [place] [address] o [phone] o Mon-Fri 8:00-5:00 o Babies seen by providers at [facility] o Does NOT accept Medicaid o Only accepting babies of parents who are patients o Please call early in hospitalization for appointment (limited availability) . [facility] o Clark, MD; Frye, MD; Kelleher, MD; Mack, NP; Miller, MD; O'Keller, MD; Patterson, NP; Pudlo, MD; Puzio, MD; Thomas, MD; Tucker, MD; Twiselton, MD o [address], [place], [place] [address] o [phone] o Mon-Fri 8:00-5:00, Sat 9:00-12:00 o Providers come to see babies at [facility] o Does NOT accept Medicaid  [place] ([address]) . [facility] o Limited providers accepting new patients: Brake, NP; Wharton, PA o [address], [place], [place] [address] o [phone] o Mon-Fri 8:00-5:00 o Babies seen by providers at [facility] o Does NOT accept Medicaid o Only accepting babies of parents who are patients o Please call early in hospitalization for appointment (limited availability) . [facility] o Gay, MD; Quinlan, MD o [address] [place], [place] [address] o [phone] (press 1 to schedule appointment) o Mon-Fri 8:00-5:00 o Providers come to see babies at [facility] o Does NOT accept Medicaid . [facility] o Mazer, MD o [address] [place], [place] [address] o [phone] o Mon-Fri 8:30-5:00 (lunch 12:30-1:00), extended hours by appointment only Wed 5:00-6:30 o Babies seen by [facility] providers o Accepting Medicaid . [facility] o Banks, MD; Jordan, MD; Koberlein, MD o [address], [place], [place] [address] o [phone] o Mon-Fri 8:00-5:00 o Babies seen by [facility] providers o Does NOT accept Medicaid . [facility] o Parker, MD; Hunter, MD; Wallace, DO o [address] [place], [place]  [address] o [phone] o Mon-Fri 8:00-5:00 o Babies seen by [facility] providers o Does NOT accept Medicaid . [facility] o Brandon, PA; Brecken, PA; Christy, NP; Dees, MD; DeClaire, MD; DeWeese, MD; Hansen, NP; Mills, NP; Parrish, NP; Smoot, NP; Summer, MD; Vapne, MD o [address] [place], [place] [address] o [phone] o Mon-Fri 8:30-5:00, Sat 10:00-1:00 o Providers come to see babies at [facility] o Does NOT accept Medicaid o Free prenatal information session Tuesdays at 4:45pm . [facility] o Bouska, MD; Gordon, PA; Jeffery, PA; Weber, PA o [address] [place] [address] o [phone] o Mon-Fri 7:30-5:30 o Babies seen by [facility] providers . [facility] o [address], [place], [place]  [address] o [phone]   Fax - [phone]  [place] ([address] & [address]) . [facility] o [place]ese, MD o [address] [place], [place] [address] o [phone] o Mon-Thur 8:00-6:00 o Providers come to see babies at [facility] o Accepting Medicaid . [facility] o Anderson, NP; Badger, MD; Beal, PA; Spencer, PA o [address] [place], [place] [address] o [phone] o Mon-Thur 7:30-7:30, Fri 7:30-4:30 o Babies seen by [facility] providers o Accepting Medicaid . [facility] o Agbuya, MD; Klett, NP; Romgoolam, MD o [address], [place], [place] [address] o [phone] o Mon-Fri 8:30-5:00, Sat 8:30-12:00 o Providers come to see babies at [facility] o Accepting Medicaid o Must have "Meet & Greet" appointment at office prior to delivery . [facility] o McCord,   MD; Wallace, MD; Wood, MD o [address], [place], [place] [address] o [phone] o Mon-Wed 8:00-6:00, Thur-Fri 8:00-5:00, Sat 9:00-12:00 o Providers come to  see babies at [facility] o Does NOT accept Medicaid o Only accepting siblings of current patients . [facility]  o [address], [place], [place]  [address] o [phone]   Fax - [phone] . [facility] o [address], [place], [place]  [address] o [phone]   Fax - [phone]  [place] ([address] & [address]) . [facility] o Cirigliano, DO; Matthews, DO o [address] [place], [place] [address] o [phone] o Mon-Fri 7:00-5:00 o Babies seen by [facility] providers o Does NOT accept Medicaid . [facility] o Briscoe, MD; Howley, PA; Moreira, PA o [address], [place], [place] [address] o [phone] o Mon-Fri 8:00-5:00 o Babies seen by [facility] providers o Accepting Medicaid . [facility] o Boyd, MD; Church, PA; Jones, NP; Osborn, PA o [address], , [place] [address] o [phone] o Mon-Fri 8:00-5:00 o Babies seen by providers at [facility] o Accepting Medicaid  [place] ([address]) . [facility] [place] o Wendling, DO o [address] [place], [place] [address] o [phone] o Mon-Fri 8:00-5:00 o Babies seen by [facility] providers o Does NOT accept Medicaid o Limited availability, please call early in hospitalization to schedule follow-up . [facility] o Calderon, PA; Cummings, MD; Dillard, MD; Martin, PA; Olson, MD; VanDeven, PA o [address], [place], [place] [address] o [phone] o Mon-Fri 8:30-5:00, Sat 9:00-12:00 o Babies seen by providers at [facility] o Accepting Medicaid o Please register online then schedule online or call office o [email or web address] . [facility] o Hunter, NP; Kumar, MD; Martin Rogers, PA o [address], [place], [place] [address] o [phone] o Mon-Fri 8:00-5:00 o Babies seen by providers at [facility] o Accepting Medicaid . [facility] ([facility] o Wixom, MD; Kristi Fleenor, NP; West, MD o [address], [place], [place] [address] o [phone] o Mon-Fri 8:00-5:30, Sat&Sun by appointment (phones open at 8:30) o Babies seen by [facility] providers o Accepting Medicaid o Must be a first-time baby or sibling of current patient . [facility] [place]  o [address], [place], [place]  [address] o [phone]   Fax - [phone]  [place] ([address]) . [facility] o Brown, PA; Cowen, PA; Rice, MD; Helton, PA; Spry, MD o [address] [place], [place] [address] o [phone] o Mon-Thur 8:00-7:00, Fri 8:00-5:00, Sat 8:00-12:00, Sun 9:00-12:00 o Babies seen by [facility] providers o Accepting Medicaid . [facility] o Coe-Goins, MD; Marshall, MD; Pierre-Louis, MD o [address] [place] [address], [place], [place] [address] o [phone] o Mon-Thur 8:00-5:00 o Babies seen by providers at [facility] o Accepting Medicaid . [facility] o Bratton, MD; Coe-Goins, MD; Hayes, MD; Lewis, MD; List, MD; Lott, MD; Marshall, MD; Moran, MD; O'Neal, MD; Pierre-Louis, MD; Pitonzo, MD; Scholer, MD; Spangle, MD o [address] [place], [place]   [address] o [phone] o Mon-Fri 8:00-5:30, Sat (Oct.-Mar.) 9:00-1:00 o Babies seen by providers at [facility] o Accepting Medicaid o Must fill out new patient packet, available online at [email or web address] . [facility] ([facility] o Friddle, NP; Harris, NP; Kelly, NP; Logan, MD;  Melvin, PA; Poth, MD; Ramadoss, MD; Stanton, NP o [address], [place], [place] [address] o [phone] o Mon-Thur 8:00-5:30, Fri 8:00-5:00 o Babies seen by providers at [facility] o Accepting Medicaid  [place] ([address]) . [facility] o Dixon, PA; [place], MD; Pickard, MD; Tapia, PA o [address], [place], [place] [address] o [phone] o Mon-Fri 8:00-5:00 o Babies seen by providers at [facility] o Accepting Medicaid   [place] ([address]) . [facility] [place] o Masneri, DO; Meyers, MD; Nelson, PA o [address], [place], [place] [address] o [phone] o Mon-Fri 8:00-5:00 o Babies seen by providers at [facility] o Does NOT accept Medicaid o Limited appointment availability, please call early in hospitalization  . [facility] [place] o Kunedd, DO; McGowen, MD o [address], [place], [place] [address] o [phone] o Mon-Fri 8:00-5:00 o Babies seen by [facility] providers o Does NOT accept Medicaid . [facility] [place] o Cameron, MD; MacDonald, MD; Michaels, PA; Nayak, MD o [address] [place] [address], [place], [place] [address] o [phone] o Mon-Fri 8:00-5:00 o After hours clinic ([address] [place], [place] [address]) [phone] Mon-Fri 5:00-8:00, Sat 12:00-6:00, Sun 10:00-4:00 o Babies seen by [facility] providers o Accepting Medicaid . [facility] [place] o [address], [place], [place]  [address] o [phone]   Fax - [phone]  [place] ([address]) . [facility] [place] o Andy, MD o [address], [place], [place] [address] o [phone] o Mon-Fri 8:00-5:00 o Babies seen by [facility] providers o Does NOT accept Medicaid . [facility] [place] ([facility] [place]) o Eksir, MD o [address], [place], [place]  [address] o [phone] o Mon-Thur 8:00-7:00, Fri 8:00-5:00, Sat 8:00-12:00 o Babies seen by providers at [facility] o Accepting Medicaid - but does not have vaccinations in office (must be received elsewhere) o Limited availability, please call early in hospitalization  [place] ([address]) . [facility]  o Charlene Flemming, MD o [address],  [place] [address] o [phone]  Fax [phone]   

## 2020-07-08 NOTE — Progress Notes (Signed)
   PRENATAL VISIT NOTE  Subjective:  Dorothy Meadows is a 35 y.o. [id number] at [redacted]w[redacted]d being seen today for ongoing prenatal care.  She is currently monitored for the following issues for this high-risk pregnancy and has GERD (gastroesophageal reflux disease); Cigarette smoker one half pack a day or less; SVT (supraventricular tachycardia) (HCC); Supervision of high risk pregnancy, antepartum; Advanced maternal age in multigravida, second trimester; History of gestational diabetes in prior pregnancy, currently pregnant; and Limited prenatal care in second trimester on their problem list.  Patient reports no complaints.  Contractions: Not present. Vag. Bleeding: None,Scant.  Movement: Present. Denies leaking of fluid.   The following portions of the patient's history were reviewed and updated as appropriate: allergies, current medications, past family history, past medical history, past social history, past surgical history and problem list.   Objective:   Vitals:   07/08/20 1359  BP: (!) 154/95  Pulse: 81  Weight: 229 lb (103.9 kg)    Fetal Status: Fetal Heart Rate (bpm): 141 Fundal Height: 31 cm Movement: Present     General:  Alert, oriented and cooperative. Patient is in no acute distress.  Skin: Skin is warm and dry. No rash noted.   Cardiovascular: Normal heart rate noted  Respiratory: Normal respiratory effort, no problems with respiration noted  Abdomen: Soft, gravid, appropriate for gestational age.  Pain/Pressure: Absent     Pelvic: Cervical exam deferred        Extremities: Normal range of motion.  Edema: None  Mental Status: Normal mood and affect. Normal behavior. Normal judgment and thought content.   Assessment and Plan:  Pregnancy: [id number] at [redacted]w[redacted]d 1. Supervision of high risk pregnancy, antepartum Patient is doing well without complaints Patient is undecided on pediatrician Patient with elevated BP today without symptoms. Will monitory closely Patient with light  spotting immediately following intercourse- reassurance provided  2. Advanced maternal age in multigravida, second trimester   Preterm labor symptoms and general obstetric precautions including but not limited to vaginal bleeding, contractions, leaking of fluid and fetal movement were reviewed in detail with the patient. Please refer to After Visit Summary for other counseling recommendations.   Return in about 2 weeks (around 07/22/2020) for in person, ROB, High risk.  Future Appointments  Date Time Provider Department Center  07/20/2020 10:30 AM [facility] NURSE [facility]  07/20/2020 10:45 AM [facility]    Catalina Antigua, MD

## 2020-07-20 ENCOUNTER — Other Ambulatory Visit: Payer: Self-pay

## 2020-07-20 ENCOUNTER — Inpatient Hospital Stay (HOSPITAL_COMMUNITY)
Admission: AD | Admit: 2020-07-20 | Discharge: 2020-07-28 | DRG: 787 | Disposition: A | Discharge status: Home / Self Care | Payer: Medicaid Other | Attending: Obstetrics & Gynecology | Admitting: Obstetrics & Gynecology

## 2020-07-20 ENCOUNTER — Ambulatory Visit: Payer: Medicaid Other

## 2020-07-20 ENCOUNTER — Encounter (HOSPITAL_COMMUNITY): Payer: Self-pay | Admitting: Obstetrics and Gynecology

## 2020-07-20 DIAGNOSIS — O4590 Premature separation of placenta, unspecified, unspecified trimester: Secondary | ICD-10-CM

## 2020-07-20 DIAGNOSIS — Z3A32 32 weeks gestation of pregnancy: Secondary | ICD-10-CM

## 2020-07-20 DIAGNOSIS — O09522 Supervision of elderly multigravida, second trimester: Secondary | ICD-10-CM | POA: Diagnosis present

## 2020-07-20 DIAGNOSIS — Z20822 Contact with and (suspected) exposure to covid-19: Secondary | ICD-10-CM | POA: Diagnosis present

## 2020-07-20 DIAGNOSIS — Z3A33 33 weeks gestation of pregnancy: Secondary | ICD-10-CM

## 2020-07-20 DIAGNOSIS — O1413 Severe pre-eclampsia, third trimester: Secondary | ICD-10-CM | POA: Diagnosis present

## 2020-07-20 DIAGNOSIS — I471 Supraventricular tachycardia: Secondary | ICD-10-CM | POA: Diagnosis present

## 2020-07-20 DIAGNOSIS — O1493 Unspecified pre-eclampsia, third trimester: Secondary | ICD-10-CM

## 2020-07-20 DIAGNOSIS — O1414 Severe pre-eclampsia complicating childbirth: Principal | ICD-10-CM | POA: Diagnosis present

## 2020-07-20 DIAGNOSIS — O403XX0 Polyhydramnios, third trimester, not applicable or unspecified: Secondary | ICD-10-CM | POA: Diagnosis present

## 2020-07-20 DIAGNOSIS — Z23 Encounter for immunization: Secondary | ICD-10-CM

## 2020-07-20 DIAGNOSIS — O99334 Smoking (tobacco) complicating childbirth: Secondary | ICD-10-CM | POA: Diagnosis present

## 2020-07-20 DIAGNOSIS — O321XX0 Maternal care for breech presentation, not applicable or unspecified: Secondary | ICD-10-CM | POA: Diagnosis present

## 2020-07-20 DIAGNOSIS — O365930 Maternal care for other known or suspected poor fetal growth, third trimester, not applicable or unspecified: Secondary | ICD-10-CM | POA: Diagnosis present

## 2020-07-20 DIAGNOSIS — O9942 Diseases of the circulatory system complicating childbirth: Secondary | ICD-10-CM | POA: Diagnosis present

## 2020-07-20 DIAGNOSIS — F17210 Nicotine dependence, cigarettes, uncomplicated: Secondary | ICD-10-CM | POA: Diagnosis present

## 2020-07-20 DIAGNOSIS — O4703 False labor before 37 completed weeks of gestation, third trimester: Secondary | ICD-10-CM | POA: Diagnosis present

## 2020-07-20 DIAGNOSIS — O0990 Supervision of high risk pregnancy, unspecified, unspecified trimester: Secondary | ICD-10-CM

## 2020-07-20 DIAGNOSIS — O0932 Supervision of pregnancy with insufficient antenatal care, second trimester: Secondary | ICD-10-CM

## 2020-07-20 DIAGNOSIS — O4593 Premature separation of placenta, unspecified, third trimester: Secondary | ICD-10-CM | POA: Diagnosis present

## 2020-07-20 LAB — PROTEIN / CREATININE RATIO, URINE
Creatinine, Urine: 246.17 mg/dL
Protein Creatinine Ratio: 0.12 mg/mg{Cre} (ref 0.00–0.15)
Total Protein, Urine: 30 mg/dL

## 2020-07-20 LAB — COMPREHENSIVE METABOLIC PANEL
ALT: 9 U/L (ref 0–44)
AST: 19 U/L (ref 15–41)
Albumin: 2.6 g/dL — ABNORMAL LOW (ref 3.5–5.0)
Alkaline Phosphatase: 137 U/L — ABNORMAL HIGH (ref 38–126)
Anion gap: 7 (ref 5–15)
BUN: 6 mg/dL (ref 6–20)
CO2: 23 mmol/L (ref 22–32)
Calcium: 8.7 mg/dL — ABNORMAL LOW (ref 8.9–10.3)
Chloride: 105 mmol/L (ref 98–111)
Creatinine, Ser: 0.64 mg/dL (ref 0.44–1.00)
GFR, Estimated: >60 mL/min (ref >60)
Glucose, Bld: 70 mg/dL (ref 70–99)
Potassium: 3.4 mmol/L — ABNORMAL LOW (ref 3.5–5.1)
Sodium: 135 mmol/L (ref 135–145)
Total Bilirubin: 0.3 mg/dL (ref 0.3–1.2)
Total Protein: 6.1 g/dL — ABNORMAL LOW (ref 6.5–8.1)

## 2020-07-20 LAB — CBC WITH DIFFERENTIAL/PLATELET
Abs Immature Granulocytes: 0.06 10*3/uL (ref 0.00–0.07)
Basophils Absolute: 0 10*3/uL (ref 0.0–0.1)
Basophils Relative: 0 %
Eosinophils Absolute: 0.2 10*3/uL (ref 0.0–0.5)
Eosinophils Relative: 1 %
HCT: 36.7 % (ref 36.0–46.0)
Hemoglobin: 12.1 g/dL (ref 12.0–15.0)
Immature Granulocytes: 1 %
Lymphocytes Relative: 22 %
Lymphs Abs: 2.8 10*3/uL (ref 0.7–4.0)
MCH: 30.3 pg (ref 26.0–34.0)
MCHC: 33 g/dL (ref 30.0–36.0)
MCV: 91.8 fL (ref 80.0–100.0)
Monocytes Absolute: 1.1 10*3/uL — ABNORMAL HIGH (ref 0.1–1.0)
Monocytes Relative: 9 %
Neutro Abs: 8.5 10*3/uL — ABNORMAL HIGH (ref 1.7–7.7)
Neutrophils Relative %: 67 %
Platelets: 186 10*3/uL (ref 150–400)
RBC: 4 MIL/uL (ref 3.87–5.11)
RDW: 14 % (ref 11.5–15.5)
WBC: 12.6 10*3/uL — ABNORMAL HIGH (ref 4.0–10.5)
nRBC: 0 % (ref 0.0–0.2)

## 2020-07-20 LAB — WET PREP, GENITAL
Clue Cells Wet Prep HPF POC: NONE SEEN
Sperm: NONE SEEN
Trich, Wet Prep: NONE SEEN
Yeast Wet Prep HPF POC: NONE SEEN

## 2020-07-20 LAB — TYPE AND SCREEN
ABO/RH(D): A POS
Antibody Screen: NEGATIVE

## 2020-07-20 LAB — RESP PANEL BY RT-PCR (FLU A&B, COVID) ARPGX2
Influenza A by PCR: NEGATIVE
Influenza B by PCR: NEGATIVE
SARS Coronavirus 2 by RT PCR: NEGATIVE

## 2020-07-20 MED ORDER — LABETALOL HCL 5 MG/ML IV SOLN
INTRAVENOUS | Status: AC
  Filled 2020-07-20: qty 4

## 2020-07-20 MED ORDER — PRENATAL MULTIVITAMIN CH
1.0000 | ORAL_TABLET | Freq: Every day | ORAL | Status: DC
  Administered 2020-07-21 – 2020-07-25 (×5): 1 via ORAL
  Filled 2020-07-20 (×5): qty 1

## 2020-07-20 MED ORDER — LACTATED RINGERS IV SOLN: INTRAVENOUS | Status: DC

## 2020-07-20 MED ORDER — DOCUSATE SODIUM 100 MG PO CAPS
100.0000 mg | ORAL_CAPSULE | Freq: Every day | ORAL | Status: DC
  Administered 2020-07-21 – 2020-07-25 (×4): 100 mg via ORAL
  Filled 2020-07-20 (×5): qty 1

## 2020-07-20 MED ORDER — LABETALOL HCL 5 MG/ML IV SOLN
20.0000 mg | INTRAVENOUS | Status: DC | PRN
  Administered 2020-07-20 – 2020-07-25 (×4): 20 mg via INTRAVENOUS
  Filled 2020-07-20 (×4): qty 4

## 2020-07-20 MED ORDER — ACETAMINOPHEN 325 MG PO TABS
650.0000 mg | ORAL_TABLET | ORAL | Status: DC | PRN
  Administered 2020-07-21 – 2020-07-24 (×4): 650 mg via ORAL
  Filled 2020-07-20 (×5): qty 2

## 2020-07-20 MED ORDER — MAGNESIUM SULFATE BOLUS VIA INFUSION
4.0000 g | Freq: Once | INTRAVENOUS | Status: AC
  Administered 2020-07-20: 4 g via INTRAVENOUS
  Filled 2020-07-20: qty 1000

## 2020-07-20 MED ORDER — ZOLPIDEM TARTRATE 5 MG PO TABS: 5.0000 mg | ORAL_TABLET | Freq: Every evening | ORAL | Status: DC | PRN

## 2020-07-20 MED ORDER — HYDRALAZINE HCL 20 MG/ML IJ SOLN: 10.0000 mg | INTRAMUSCULAR | Status: DC | PRN

## 2020-07-20 MED ORDER — LABETALOL HCL 5 MG/ML IV SOLN: 80.0000 mg | INTRAVENOUS | Status: DC | PRN

## 2020-07-20 MED ORDER — BETAMETHASONE SOD PHOS & ACET 6 (3-3) MG/ML IJ SUSP
12.0000 mg | Freq: Once | INTRAMUSCULAR | Status: AC
  Administered 2020-07-20: 12 mg via INTRAMUSCULAR
  Filled 2020-07-20: qty 5

## 2020-07-20 MED ORDER — MAGNESIUM SULFATE 40 GM/1000ML IV SOLN
2.0000 g/h | INTRAVENOUS | Status: AC
  Administered 2020-07-21: 2 g/h via INTRAVENOUS
  Filled 2020-07-20 (×2): qty 1000

## 2020-07-20 MED ORDER — LABETALOL HCL 5 MG/ML IV SOLN
40.0000 mg | INTRAVENOUS | Status: DC | PRN
  Administered 2020-07-25: 40 mg via INTRAVENOUS
  Filled 2020-07-20: qty 8

## 2020-07-20 MED ORDER — CALCIUM CARBONATE ANTACID 500 MG PO CHEW
2.0000 | CHEWABLE_TABLET | ORAL | Status: DC | PRN
  Administered 2020-07-21 – 2020-07-22 (×4): 400 mg via ORAL
  Filled 2020-07-20 (×4): qty 2

## 2020-07-20 NOTE — MAU Provider Note (Signed)
History     CSN: [id number]  Arrival date and time: 07/20/20 1555   Event Date/Time   First Provider Initiated Contact with Patient 07/20/20 1715      Chief Complaint  Patient presents with  . Contractions  . Vaginal Bleeding   Dorothy Meadows is a 35 y.o. [id number] at [redacted]w[redacted]d who receives care at [facility].  She presents today for Contractions and Vaginal Bleeding.  However, upon arrival patient with severe range blood pressure and PreE labs ordered. Provider to bedside and patient reports she is having vaginal bleeding and contractions.  She reports "I passed my plug and when it came out I had a lot of blood and clots." She states this occurred "in the middle of the night."   Patient reports she passed clots that were "bigger than cherries."  She endorses sexual activity yesterday morning.  She endorses fetal movement and contractions and abdominal cramping that occurs Q20-15min.  However, her sister reports that they were 3 min apart prior to arrival.  Patient reports that she has blurry vision with "sparks of light like I have been staring into the light."  She states the blurry vision is no change from reason complaints, but that the sparks of light is new.    OB History    Gravida  5   Para  3   Term  3   Preterm  0   AB  1   Living  3     SAB  1   IAB  0   Ectopic  0   Multiple  0   Live Births  3           Past Medical History:  Diagnosis Date  . Genital warts   . Gestational diabetes    first pregnancy  . Gonorrhea    1st trimester this pregnancy  . Hyperlipidemia   . Hypertension   . No pertinent past medical history   . SVT (supraventricular tachycardia) (HCC)     Past Surgical History:  Procedure Laterality Date  . removal of warts      Family History  Problem Relation Age of Onset  . Diabetes Mother   . Hypertension Mother   . Depression Mother   . Anxiety disorder Mother   . Diabetes Maternal Grandmother   . Hypertension  Maternal Grandmother   . Heart disease Maternal Grandmother        24s    Social History   Tobacco Use  . Smoking status: Current Every Day Smoker    Packs/day: 0.50    Years: 15.00    Pack years: 7.50    Types: Cigarettes  . Smokeless tobacco: Never Used  Vaping Use  . Vaping Use: Never used  Substance Use Topics  . Alcohol use: Not Currently  . Drug use: Yes    Types: Marijuana    Allergies: No Known Allergies  Medications Prior to Admission  Medication Sig Dispense Refill Last Dose  . Prenatal Vit-Fe Fumarate-FA (PRENATAL MULTIVITAMIN) TABS tablet Take 1 tablet by mouth daily at 12 noon.   Past Week at Unknown time  . aspirin EC 81 MG tablet Take 1 tablet (81 mg total) by mouth daily. Swallow whole. 30 tablet 5     Review of Systems  Constitutional: Negative for chills and fever.  Eyes: Positive for visual disturbance.  Respiratory: Negative for cough and shortness of breath.   Gastrointestinal: Negative for nausea and vomiting.  Genitourinary: Positive for vaginal bleeding. Negative  for difficulty urinating, dysuria and vaginal discharge.  Neurological: Negative for dizziness, light-headedness and headaches.   Physical Exam   Blood pressure (!) 157/96, pulse 81, temperature 98.6 F (37 C), temperature source Oral, resp. rate 20, height 5\' 9"  (1.753 m), weight 106.4 kg, last menstrual period 11/09/2019, SpO2 100 %.  Vitals:   07/20/20 1700 07/20/20 1716 07/20/20 1731 07/20/20 1746  BP: (!) 157/96 (!) 172/112 (!) 169/110 (!) 157/100  Pulse: 81 79 79 77  Resp:      Temp:      TempSrc:      SpO2: 100%     Weight:      Height:        Physical Exam Vitals reviewed. Exam conducted with a chaperone present.  Constitutional:      Appearance: Normal appearance.  HENT:     Head: Normocephalic and atraumatic.  Eyes:     Conjunctiva/sclera: Conjunctivae normal.  Cardiovascular:     Rate and Rhythm: Normal rate.     Heart sounds: Normal heart sounds.   Pulmonary:     Effort: Pulmonary effort is normal.     Breath sounds: Normal breath sounds.  Abdominal:     General: Bowel sounds are normal.  Genitourinary:    Comments: Speculum Exam: -Normal External Genitalia: Non tender, no apparent discharge at introitus.  -Vaginal Vault: Pink mucosa with good rugae. Small amt mucoid type discharge.  Removed with faux swab x 3 -wet prep collected -Cervix:Pink, no lesions, cysts, or polyps.  Appears closed. No active bleeding from os-GC/CT collected -Bimanual Exam: Dilation: Closed Station: Ballotable Presentation: Undeterminable Exam by:: [email or web address], CNM  Musculoskeletal:        General: Normal range of motion.     Right lower leg: No edema.     Left lower leg: No edema.  Skin:    General: Skin is warm and dry.  Neurological:     Mental Status: She is alert and oriented to person, place, and time.  Psychiatric:        Mood and Affect: Mood normal.        Behavior: Behavior normal.        Thought Content: Thought content normal.     Fetal Assessment 135 bpm, Mod Var, -Decels, +10x10 Accels Toco: None graphed  MAU Course   Results for orders placed or performed during the hospital encounter of 07/20/20 (from the past 24 hour(s))  CBC with Differential/Platelet     Status: Abnormal   Collection Time: 07/20/20  4:53 PM  Result Value Ref Range   WBC 12.6 (H) 4.0 - 10.5 K/uL   RBC 4.00 3.87 - 5.11 MIL/uL   Hemoglobin 12.1 12.0 - 15.0 g/dL   HCT 07/22/20 91.7 - 91.5 %   MCV 91.8 80.0 - 100.0 fL   MCH 30.3 26.0 - 34.0 pg   MCHC 33.0 30.0 - 36.0 g/dL   RDW 05.6 97.9 - 48.0 %   Platelets 186 150 - 400 K/uL   nRBC 0.0 0.0 - 0.2 %   Neutrophils Relative % 67 %   Neutro Abs 8.5 (H) 1.7 - 7.7 K/uL   Lymphocytes Relative 22 %   Lymphs Abs 2.8 0.7 - 4.0 K/uL   Monocytes Relative 9 %   Monocytes Absolute 1.1 (H) 0.1 - 1.0 K/uL   Eosinophils Relative 1 %   Eosinophils Absolute 0.2 0.0 - 0.5 K/uL   Basophils Relative 0 %   Basophils Absolute  0.0 0.0 - 0.1 K/uL  Immature Granulocytes 1 %   Abs Immature Granulocytes 0.06 0.00 - 0.07 K/uL  Comprehensive metabolic panel     Status: Abnormal   Collection Time: 07/20/20  4:53 PM  Result Value Ref Range   Sodium 135 135 - 145 mmol/L   Potassium 3.4 (L) 3.5 - 5.1 mmol/L   Chloride 105 98 - 111 mmol/L   CO2 23 22 - 32 mmol/L   Glucose, Bld 70 70 - 99 mg/dL   BUN 6 6 - 20 mg/dL   Creatinine, Ser 0.86 0.44 - 1.00 mg/dL   Calcium 8.7 (L) 8.9 - 10.3 mg/dL   Total Protein 6.1 (L) 6.5 - 8.1 g/dL   Albumin 2.6 (L) 3.5 - 5.0 g/dL   AST 19 15 - 41 U/L   ALT 9 0 - 44 U/L   Alkaline Phosphatase 137 (H) 38 - 126 U/L   Total Bilirubin 0.3 0.3 - 1.2 mg/dL   GFR, Estimated >76 >19 mL/min   Anion gap 7 5 - 15  Protein / creatinine ratio, urine     Status: None   Collection Time: 07/20/20  5:09 PM  Result Value Ref Range   Creatinine, Urine 246.17 mg/dL   Total Protein, Urine 30 mg/dL   Protein Creatinine Ratio 0.12 0.00 - 0.15 mg/mg[Cre]   No results found.  MDM Physical Exam Labs: CBC, CMP, PC Ratio Measure BPQ15 min EFM Start IV IV Antihypertensives MD Consult MgSO4 Bolus/Infusion BMZ Assessment and Plan  35 year old [id number]  SIUP at 32.4weeks Cat I FT gHTN Vaginal Bleeding   -Vitals reviewed. -PreEclampsia labs ordered. -Exam findings discussed. -Patient with severe range x 2  -Start IV and give Labetalol. -Patient informed that MD will be consulted and she will likely need to be admitted. -Dr. Marcie Bal consulted and informed of patient status, labs in process, and recommendation. Advised: *Give MgSO4 infusion at 4/2 *Give BMZ dosing *Agrees with admit to Antenatal  -Provider to bedside to update patient on POC. -Will return to perform speculum exam prior to transfer.  Cherre Robins MSN, CNM 07/20/2020, 5:35 PM   Reassessment (6:51 PM) Pelvic exam completed GC/CT and WP collected Patient without questions or concerns. Standard Antenatal orders  placed.  Cherre Robins MSN, CNM Advanced Practice Provider, [facility] [employer] [facility]

## 2020-07-20 NOTE — MAU Note (Signed)
Had a bad pain, thought she needed to go to the bathroom and she did.  When she wiped, she saw her plug, there was a lot of blood in it and clots. Has been having mild contractions ever since, q 20-30 min. No longer bleeding.  No hx of PTL

## 2020-07-21 ENCOUNTER — Inpatient Hospital Stay (HOSPITAL_BASED_OUTPATIENT_CLINIC_OR_DEPARTMENT_OTHER): Payer: Medicaid Other

## 2020-07-21 DIAGNOSIS — O365930 Maternal care for other known or suspected poor fetal growth, third trimester, not applicable or unspecified: Secondary | ICD-10-CM | POA: Diagnosis not present

## 2020-07-21 DIAGNOSIS — O1413 Severe pre-eclampsia, third trimester: Secondary | ICD-10-CM | POA: Diagnosis not present

## 2020-07-21 DIAGNOSIS — O403XX0 Polyhydramnios, third trimester, not applicable or unspecified: Secondary | ICD-10-CM | POA: Diagnosis not present

## 2020-07-21 DIAGNOSIS — I471 Supraventricular tachycardia: Secondary | ICD-10-CM

## 2020-07-21 DIAGNOSIS — O1493 Unspecified pre-eclampsia, third trimester: Secondary | ICD-10-CM | POA: Diagnosis not present

## 2020-07-21 DIAGNOSIS — O09293 Supervision of pregnancy with other poor reproductive or obstetric history, third trimester: Secondary | ICD-10-CM

## 2020-07-21 DIAGNOSIS — Z3A32 32 weeks gestation of pregnancy: Secondary | ICD-10-CM

## 2020-07-21 DIAGNOSIS — O09523 Supervision of elderly multigravida, third trimester: Secondary | ICD-10-CM

## 2020-07-21 DIAGNOSIS — O99413 Diseases of the circulatory system complicating pregnancy, third trimester: Secondary | ICD-10-CM | POA: Diagnosis not present

## 2020-07-21 LAB — GC/CHLAMYDIA PROBE AMP (~~LOC~~) NOT AT ARMC
Chlamydia: NEGATIVE
Comment: NEGATIVE
Comment: NORMAL
Neisseria Gonorrhea: NEGATIVE

## 2020-07-21 MED ORDER — NIFEDIPINE ER OSMOTIC RELEASE 30 MG PO TB24
30.0000 mg | ORAL_TABLET | Freq: Every day | ORAL | Status: DC
  Administered 2020-07-21: 30 mg via ORAL
  Filled 2020-07-21: qty 1

## 2020-07-21 MED ORDER — BETAMETHASONE SOD PHOS & ACET 6 (3-3) MG/ML IJ SUSP
12.0000 mg | Freq: Once | INTRAMUSCULAR | Status: AC
  Administered 2020-07-21: 12 mg via INTRAMUSCULAR

## 2020-07-21 NOTE — H&P (Signed)
Gerrit HeckEmly, Jessica, CNM  Certified Nurse Midwife  Obstetrics/Gynecology  MAU Provider Note    Attested  Date of Service:  07/20/2020 5:15 PM           Attested         Attestation signed by Adam PhenixArnold, Samuel Mcpeek G, MD at 07/21/2020 6:26 AM   Attestation of Attending Supervision of Advanced Practitioner (CNM/NP/PA):  Evaluation and management procedures were performed by the Advanced Practitioner under my supervision and collaboration.  I have reviewed the Advanced Practitioner's note and chart, and I agree with the management and plan.    Scheryl DarterJames Hassan Blackshire MD           Expand All Collapse All     Show:Clear all [x] Manual[x] Template[] Copied  Added by: [x] Gerrit HeckEmly, Jessica, CNM   [] Hover for details   History   CSN: [id number]  Arrival date and time: 07/20/20 1555   Event Date/Time   First Provider Initiated Contact with Patient 07/20/20 1715         Chief Complaint  Patient presents with  . Contractions  . Vaginal Bleeding   Dorothy Meadows is a 35 y.o. [id number] at 2939w4d who [facility]  She presents today for Contractions and Vaginal Bleeding.  However, upon arrival patient with severe range blood pressure and PreE labs ordered. Provider to bedside and patient reports she is having vaginal bleeding and contractions.  She reports "I passed my plug and when it came out I had a lot of blood and clots." She states this occurred "in the middle of the night."   Patient reports she passed clots that were "bigger than cherries."  She endorses sexual activity yesterday morning.  She endorses fetal movement and contractions and abdominal cramping that occurs Q20-425min.  However, her sister reports that they were 3 min apart prior to arrival.  Patient reports that she has blurry vision with "sparks of light like I have been staring into the light."  She states the blurry vision is no change from reason complaints, but that the sparks of light is new.             OB History    Gravida  5   Para  3   Term  3   Preterm  0   AB  1   Living  3     SAB  1   IAB  0   Ectopic  0   Multiple  0   Live Births  3               Past Medical History:  Diagnosis Date  . Genital warts   . Gestational diabetes    first pregnancy  . Gonorrhea    1st trimester this pregnancy  . Hyperlipidemia   . Hypertension   . No pertinent past medical history   . SVT (supraventricular tachycardia) (HCC)          Past Surgical History:  Procedure Laterality Date  . removal of warts           Family History  Problem Relation Age of Onset  . Diabetes Mother   . Hypertension Mother   . Depression Mother   . Anxiety disorder Mother   . Diabetes Maternal Grandmother   . Hypertension Maternal Grandmother   . Heart disease Maternal Grandmother        7770s    Social History        Tobacco Use  . Smoking status: Current Every  Day Smoker    Packs/day: 0.50    Years: 15.00    Pack years: 7.50    Types: Cigarettes  . Smokeless tobacco: Never Used  Vaping Use  . Vaping Use: Never used  Substance Use Topics  . Alcohol use: Not Currently  . Drug use: Yes    Types: Marijuana    Allergies: No Known Allergies         Medications Prior to Admission  Medication Sig Dispense Refill Last Dose  . Prenatal Vit-Fe Fumarate-FA (PRENATAL MULTIVITAMIN) TABS tablet Take 1 tablet by mouth daily at 12 noon.   Past Week at Unknown time  . aspirin EC 81 MG tablet Take 1 tablet (81 mg total) by mouth daily. Swallow whole. 30 tablet 5     Review of Systems  Constitutional: Negative for chills and fever.  Eyes: Positive for visual disturbance.  Respiratory: Negative for cough and shortness of breath.   Gastrointestinal: Negative for nausea and vomiting.  Genitourinary: Positive for vaginal bleeding. Negative for difficulty urinating, dysuria and vaginal discharge.  Neurological: Negative for  dizziness, light-headedness and headaches.   Physical Exam   Blood pressure (!) 157/96, pulse 81, temperature 98.6 F (37 C), temperature source Oral, resp. rate 20, height 5\' 9"  (1.753 m), weight 106.4 kg, last menstrual period 11/09/2019, SpO2 100 %.        Vitals:   07/20/20 1700 07/20/20 1716 07/20/20 1731 07/20/20 1746  BP: (!) 157/96 (!) 172/112 (!) 169/110 (!) 157/100  Pulse: 81 79 79 77  Resp:      Temp:      TempSrc:      SpO2: 100%     Weight:      Height:        Physical Exam Vitals reviewed. Exam conducted with a chaperone present.  Constitutional:      Appearance: Normal appearance.  HENT:     Head: Normocephalic and atraumatic.  Eyes:     Conjunctiva/sclera: Conjunctivae normal.  Cardiovascular:     Rate and Rhythm: Normal rate.     Heart sounds: Normal heart sounds.  Pulmonary:     Effort: Pulmonary effort is normal.     Breath sounds: Normal breath sounds.  Abdominal:     General: Bowel sounds are normal.  Genitourinary:    Comments: Speculum Exam: -Normal External Genitalia: Non tender, no apparent discharge at introitus.  -Vaginal Vault: Pink mucosa with good rugae. Small amt mucoid type discharge.  Removed with faux swab x 3 -wet prep collected -Cervix:Pink, no lesions, cysts, or polyps.  Appears closed. No active bleeding from os-GC/CT collected -Bimanual Exam: Dilation: Closed Station: Ballotable Presentation: Undeterminable Exam by:: [email or web address], CNM  Musculoskeletal:        General: Normal range of motion.     Right lower leg: No edema.     Left lower leg: No edema.  Skin:    General: Skin is warm and dry.  Neurological:     Mental Status: She is alert and oriented to person, place, and time.  Psychiatric:        Mood and Affect: Mood normal.        Behavior: Behavior normal.        Thought Content: Thought content normal.     Fetal Assessment 135 bpm, Mod Var, -Decels, +10x10 Accels Toco: None  graphed  MAU Course   Lab Results Last 24 Hours       Results for orders placed or performed during the hospital encounter  of 07/20/20 (from the past 24 hour(s))  CBC with Differential/Platelet     Status: Abnormal   Collection Time: 07/20/20  4:53 PM  Result Value Ref Range   WBC 12.6 (H) 4.0 - 10.5 K/uL   RBC 4.00 3.87 - 5.11 MIL/uL   Hemoglobin 12.1 12.0 - 15.0 g/dL   HCT 00.8 67.6 - 19.5 %   MCV 91.8 80.0 - 100.0 fL   MCH 30.3 26.0 - 34.0 pg   MCHC 33.0 30.0 - 36.0 g/dL   RDW 09.3 26.7 - 12.4 %   Platelets 186 150 - 400 K/uL   nRBC 0.0 0.0 - 0.2 %   Neutrophils Relative % 67 %   Neutro Abs 8.5 (H) 1.7 - 7.7 K/uL   Lymphocytes Relative 22 %   Lymphs Abs 2.8 0.7 - 4.0 K/uL   Monocytes Relative 9 %   Monocytes Absolute 1.1 (H) 0.1 - 1.0 K/uL   Eosinophils Relative 1 %   Eosinophils Absolute 0.2 0.0 - 0.5 K/uL   Basophils Relative 0 %   Basophils Absolute 0.0 0.0 - 0.1 K/uL   Immature Granulocytes 1 %   Abs Immature Granulocytes 0.06 0.00 - 0.07 K/uL  Comprehensive metabolic panel     Status: Abnormal   Collection Time: 07/20/20  4:53 PM  Result Value Ref Range   Sodium 135 135 - 145 mmol/L   Potassium 3.4 (L) 3.5 - 5.1 mmol/L   Chloride 105 98 - 111 mmol/L   CO2 23 22 - 32 mmol/L   Glucose, Bld 70 70 - 99 mg/dL   BUN 6 6 - 20 mg/dL   Creatinine, Ser 5.80 0.44 - 1.00 mg/dL   Calcium 8.7 (L) 8.9 - 10.3 mg/dL   Total Protein 6.1 (L) 6.5 - 8.1 g/dL   Albumin 2.6 (L) 3.5 - 5.0 g/dL   AST 19 15 - 41 U/L   ALT 9 0 - 44 U/L   Alkaline Phosphatase 137 (H) 38 - 126 U/L   Total Bilirubin 0.3 0.3 - 1.2 mg/dL   GFR, Estimated >99 >83 mL/min   Anion gap 7 5 - 15  Protein / creatinine ratio, urine     Status: None   Collection Time: 07/20/20  5:09 PM  Result Value Ref Range   Creatinine, Urine 246.17 mg/dL   Total Protein, Urine 30 mg/dL   Protein Creatinine Ratio 0.12 0.00 - 0.15 mg/mg[Cre]     No results  found.  MDM Physical Exam Labs: CBC, CMP, PC Ratio Measure BPQ15 min EFM Start IV IV Antihypertensives MD Consult MgSO4 Bolus/Infusion BMZ Assessment and Plan  35 year old [id number]  SIUP at 32.4weeks Cat I FT gHTN Vaginal Bleeding   -Vitals reviewed. -PreEclampsia labs ordered. -Exam findings discussed. -Patient with severe range x 2  -Start IV and give Labetalol. -Patient informed that MD will be consulted and she will likely need to be admitted. -Dr. Marcie Bal consulted and informed of patient status, labs in process, and recommendation. Advised: *Give MgSO4 infusion at 4/2 *Give BMZ dosing *Agrees with admit to Antenatal  -Provider to bedside to update patient on POC. -Will return to perform speculum exam prior to transfer.  Cherre Robins MSN, CNM 07/20/2020, 5:35 PM   Reassessment (6:51 PM) Pelvic exam completed GC/CT and WP collected Patient without questions or concerns. Standard Antenatal orders placed.  Cherre Robins MSN, CNM Advanced Practice Provider, [facility] [employer] [facility]          Cosigned by: Scheryl Darter  G, MD at 07/21/2020 6:26 AM

## 2020-07-21 NOTE — Progress Notes (Signed)
Patient ID: Dorothy Meadows, female   DOB: Aug 21, 1985, 35 y.o.   MRN: [id number] FACULTY PRACTICE ANTEPARTUM(COMPREHENSIVE) NOTE  Dorothy Meadows is a 35 y.o. [id number] at [redacted]w[redacted]d by early ultrasound who is admitted for preeclampsia.   Fetal presentation is unsure. Length of Stay:  0  Days  Subjective: headache Patient reports the fetal movement as active. Patient reports uterine contraction  activity as none. Patient reports  vaginal bleeding as none. Patient describes fluid per vagina as None.  Vitals:  Blood pressure (!) 142/84, pulse 79, temperature 97.7 F (36.5 C), temperature source Oral, resp. rate 18, height 5\' 9"  (1.753 m), weight 106.4 kg, last menstrual period 11/09/2019, SpO2 99 %. Physical Examination:  General appearance - alert, well appearing, and in no distress Heart - normal rate and regular rhythm Abdomen - soft, nontender, nondistended Fundal Height:  size equals dates Cervical Exam: Not evaluated. Extremities: extremities normal, atraumatic, no cyanosis or edema and Homans sign is negative, no sign of DVT Membranes:intact  Fetal Monitoring:   Fetal Heart Rate A   Mode External filed at 07/21/2020 0600  Baseline Rate (A) 120 bpm filed at 07/21/2020 0600  Variability 6-25 BPM filed at 07/21/2020 0600  Accelerations 15 x 15 filed at 07/21/2020 0600  Decelerations None filed at 07/21/2020 0600     Labs:  Results for orders placed or performed during the hospital encounter of 07/20/20 (from the past 24 hour(s))  CBC with Differential/Platelet   Collection Time: 07/20/20  4:53 PM  Result Value Ref Range   WBC 12.6 (H) 4.0 - 10.5 K/uL   RBC 4.00 3.87 - 5.11 MIL/uL   Hemoglobin 12.1 12.0 - 15.0 g/dL   HCT 07/22/20 49.7 - 02.6 %   MCV 91.8 80.0 - 100.0 fL   MCH 30.3 26.0 - 34.0 pg   MCHC 33.0 30.0 - 36.0 g/dL   RDW 37.8 58.8 - 50.2 %   Platelets 186 150 - 400 K/uL   nRBC 0.0 0.0 - 0.2 %   Neutrophils Relative % 67 %   Neutro Abs 8.5 (H) 1.7 - 7.7 K/uL    Lymphocytes Relative 22 %   Lymphs Abs 2.8 0.7 - 4.0 K/uL   Monocytes Relative 9 %   Monocytes Absolute 1.1 (H) 0.1 - 1.0 K/uL   Eosinophils Relative 1 %   Eosinophils Absolute 0.2 0.0 - 0.5 K/uL   Basophils Relative 0 %   Basophils Absolute 0.0 0.0 - 0.1 K/uL   Immature Granulocytes 1 %   Abs Immature Granulocytes 0.06 0.00 - 0.07 K/uL  Comprehensive metabolic panel   Collection Time: 07/20/20  4:53 PM  Result Value Ref Range   Sodium 135 135 - 145 mmol/L   Potassium 3.4 (L) 3.5 - 5.1 mmol/L   Chloride 105 98 - 111 mmol/L   CO2 23 22 - 32 mmol/L   Glucose, Bld 70 70 - 99 mg/dL   BUN 6 6 - 20 mg/dL   Creatinine, Ser 07/22/20 0.44 - 1.00 mg/dL   Calcium 8.7 (L) 8.9 - 10.3 mg/dL   Total Protein 6.1 (L) 6.5 - 8.1 g/dL   Albumin 2.6 (L) 3.5 - 5.0 g/dL   AST 19 15 - 41 U/L   ALT 9 0 - 44 U/L   Alkaline Phosphatase 137 (H) 38 - 126 U/L   Total Bilirubin 0.3 0.3 - 1.2 mg/dL   GFR, Estimated 1.28 >78 mL/min   Anion gap 7 5 - 15  Protein / creatinine ratio, urine  Collection Time: 07/20/20  5:09 PM  Result Value Ref Range   Creatinine, Urine 246.17 mg/dL   Total Protein, Urine 30 mg/dL   Protein Creatinine Ratio 0.12 0.00 - 0.15 mg/mg[Cre]  Wet prep, genital   Collection Time: 07/20/20  5:28 PM   Specimen: PATH Cytology Cervicovaginal Ancillary Only  Result Value Ref Range   Yeast Wet Prep HPF POC NONE SEEN NONE SEEN   Trich, Wet Prep NONE SEEN NONE SEEN   Clue Cells Wet Prep HPF POC NONE SEEN NONE SEEN   WBC, Wet Prep HPF POC FEW (A) NONE SEEN   Sperm NONE SEEN   Type and screen [facility]   Collection Time: 07/20/20  5:40 PM  Result Value Ref Range   ABO/RH(D) A POS    Antibody Screen NEG    Sample Expiration      07/23/2020,2359 Performed at [facility], [address], [place], [place] [address]   Resp Panel by RT-PCR (Flu A&B, Covid) Nasopharyngeal Swab   Collection Time: 07/20/20  7:20 PM   Specimen: Nasopharyngeal Swab; Nasopharyngeal(NP)  swabs in vial transport medium  Result Value Ref Range   SARS Coronavirus 2 by RT PCR NEGATIVE NEGATIVE   Influenza A by PCR NEGATIVE NEGATIVE   Influenza B by PCR NEGATIVE NEGATIVE     Medications:  Scheduled . docusate sodium  100 mg Oral Daily  . prenatal multivitamin  1 tablet Oral Q1200   I have reviewed the patient's current medications.  ASSESSMENT: Patient Active Problem List   Diagnosis Date Noted  . Preeclampsia, severe, third trimester 07/20/2020  . History of gestational diabetes in prior pregnancy, currently pregnant 06/22/2020  . Limited prenatal care in second trimester 06/22/2020  . Supervision of high risk pregnancy, antepartum 03/05/2020  . Advanced maternal age in multigravida, second trimester 03/05/2020  . SVT (supraventricular tachycardia) (HCC) 02/18/2014  . Cigarette smoker one half pack a day or less 12/19/2012  . GERD (gastroesophageal reflux disease) 11/14/2012    PLAN: F/U US today Complete BMZ course  Scheryl Darter 07/21/2020,6:56 AM

## 2020-07-22 ENCOUNTER — Encounter: Payer: Medicaid Other | Admitting: Obstetrics and Gynecology

## 2020-07-22 ENCOUNTER — Encounter (HOSPITAL_COMMUNITY): Payer: Self-pay | Admitting: Obstetrics and Gynecology

## 2020-07-22 DIAGNOSIS — F17210 Nicotine dependence, cigarettes, uncomplicated: Secondary | ICD-10-CM | POA: Diagnosis not present

## 2020-07-22 DIAGNOSIS — O99413 Diseases of the circulatory system complicating pregnancy, third trimester: Secondary | ICD-10-CM

## 2020-07-22 DIAGNOSIS — O365930 Maternal care for other known or suspected poor fetal growth, third trimester, not applicable or unspecified: Secondary | ICD-10-CM | POA: Diagnosis not present

## 2020-07-22 DIAGNOSIS — O09523 Supervision of elderly multigravida, third trimester: Secondary | ICD-10-CM

## 2020-07-22 DIAGNOSIS — O1413 Severe pre-eclampsia, third trimester: Secondary | ICD-10-CM | POA: Diagnosis not present

## 2020-07-22 DIAGNOSIS — O4703 False labor before 37 completed weeks of gestation, third trimester: Secondary | ICD-10-CM | POA: Diagnosis present

## 2020-07-22 DIAGNOSIS — Z3A33 33 weeks gestation of pregnancy: Secondary | ICD-10-CM

## 2020-07-22 DIAGNOSIS — O99333 Smoking (tobacco) complicating pregnancy, third trimester: Secondary | ICD-10-CM

## 2020-07-22 DIAGNOSIS — O321XX0 Maternal care for breech presentation, not applicable or unspecified: Secondary | ICD-10-CM

## 2020-07-22 DIAGNOSIS — O403XX0 Polyhydramnios, third trimester, not applicable or unspecified: Secondary | ICD-10-CM

## 2020-07-22 DIAGNOSIS — I471 Supraventricular tachycardia: Secondary | ICD-10-CM

## 2020-07-22 LAB — COMPREHENSIVE METABOLIC PANEL
ALT: 10 U/L (ref 0–44)
AST: 22 U/L (ref 15–41)
Albumin: 2.6 g/dL — ABNORMAL LOW (ref 3.5–5.0)
Alkaline Phosphatase: 135 U/L — ABNORMAL HIGH (ref 38–126)
Anion gap: 9 (ref 5–15)
BUN: 5 mg/dL — ABNORMAL LOW (ref 6–20)
CO2: 21 mmol/L — ABNORMAL LOW (ref 22–32)
Calcium: 8.6 mg/dL — ABNORMAL LOW (ref 8.9–10.3)
Chloride: 106 mmol/L (ref 98–111)
Creatinine, Ser: 0.67 mg/dL (ref 0.44–1.00)
GFR, Estimated: >60 mL/min (ref >60)
Glucose, Bld: 140 mg/dL — ABNORMAL HIGH (ref 70–99)
Potassium: 3.9 mmol/L (ref 3.5–5.1)
Sodium: 136 mmol/L (ref 135–145)
Total Bilirubin: 0.4 mg/dL (ref 0.3–1.2)
Total Protein: 6.3 g/dL — ABNORMAL LOW (ref 6.5–8.1)

## 2020-07-22 LAB — CBC
HCT: 34.8 % — ABNORMAL LOW (ref 36.0–46.0)
Hemoglobin: 11.8 g/dL — ABNORMAL LOW (ref 12.0–15.0)
MCH: 30.3 pg (ref 26.0–34.0)
MCHC: 33.9 g/dL (ref 30.0–36.0)
MCV: 89.2 fL (ref 80.0–100.0)
Platelets: 204 10*3/uL (ref 150–400)
RBC: 3.9 MIL/uL (ref 3.87–5.11)
RDW: 14.2 % (ref 11.5–15.5)
WBC: 19.3 10*3/uL — ABNORMAL HIGH (ref 4.0–10.5)
nRBC: 0 % (ref 0.0–0.2)

## 2020-07-22 MED ORDER — NIFEDIPINE ER OSMOTIC RELEASE 30 MG PO TB24
30.0000 mg | ORAL_TABLET | Freq: Two times a day (BID) | ORAL | Status: DC
  Administered 2020-07-22: 30 mg via ORAL
  Filled 2020-07-22: qty 1

## 2020-07-22 MED ORDER — PANTOPRAZOLE SODIUM 40 MG PO TBEC
40.0000 mg | DELAYED_RELEASE_TABLET | Freq: Every day | ORAL | Status: DC
  Administered 2020-07-22 – 2020-07-25 (×4): 40 mg via ORAL
  Filled 2020-07-22 (×4): qty 1

## 2020-07-22 MED ORDER — SODIUM CHLORIDE 0.9% FLUSH
3.0000 mL | Freq: Two times a day (BID) | INTRAVENOUS | Status: DC
  Administered 2020-07-22 – 2020-07-24 (×5): 3 mL via INTRAVENOUS

## 2020-07-22 MED ORDER — NIFEDIPINE ER OSMOTIC RELEASE 30 MG PO TB24
60.0000 mg | ORAL_TABLET | Freq: Two times a day (BID) | ORAL | Status: DC
  Administered 2020-07-22 – 2020-07-28 (×12): 60 mg via ORAL
  Filled 2020-07-22 (×12): qty 2

## 2020-07-22 NOTE — Consult Note (Signed)
Neonatology Consultation: Requested by: Anyanwu Reason: PIH, 32 weeks  I met with Ms. Dorothy Meadows and explained the typical condition and course of babies born prematurely at 45 weeks, the kinds of support, and expected length of stay and criteria for discharge home.    R.L. Dorothy Meadows M.D.

## 2020-07-22 NOTE — Progress Notes (Signed)
Patient ID: Dorothy Meadows, female   DOB: [birth year], 35 y.o.   MRN: [id number] FACULTY PRACTICE ANTEPARTUM(COMPREHENSIVE) NOTE  Dorothy Meadows is a 35 y.o. [id number] at [redacted]w[redacted]d by early ultrasound who is admitted for severe preeclampsia by BP criterion.   Fetal presentation is unsure. Length of Stay:  0  Days  Subjective: Patient denies any headaches, visual symptoms, RUQ/epigastric pain or other concerning symptoms. Magnesium sulfate stopped yesterday evening.  Has been started of Nifedipine XL for BP control. Patient reports uterine contraction activity as regular since yesterday.  Mild, every 2-4 minutes. No pelvic pressure Patient reports the fetal movement as active. Patient reports  vaginal bleeding as none. Patient describes fluid per vagina as none.  Vitals:  Blood pressure (!) 154/93, pulse 80, temperature 98.5 F (36.9 C), temperature source Oral, resp. rate 18, height 5\' 9"  (1.753 m), weight 106.4 kg, last menstrual period 11/09/2019, SpO2 99 %. Physical Examination: General appearance - alert, well appearing, and in no distress Heart - normal rate and regular rhythm Abdomen - soft, nontender, nondistended Fundal Height:  size equals dates Cervical Exam: Dilation: Closed Cervical Position: Posterior Station: Ballotable Presentation: Undeterminable Exam by:: Dr. [email or web address] Extremities: extremities normal, atraumatic, no cyanosis or edema and Homans sign is negative, no sign of DVT Membranes:intact  Fetal Monitoring:   Fetal Heart Rate A   Mode External filed at 07/22/2020 0004  Baseline Rate (A) 115 bpm filed at 07/22/2020 0004  Variability 6-25 BPM filed at 07/22/2020 0004  Accelerations 15 x 15 filed at 07/22/2020 0004  Decelerations None filed at 07/21/2020 1031  Category 1 FHR> Reactive tracing Contractions q 1-3 min on tocometer  Labs and Imaging:  CBC Latest Ref Rng & Units 07/22/2020 07/20/2020 06/22/2020  WBC 4.0 - 10.5 K/uL 19.3(H) 12.6(H) 12.8(H)  Hemoglobin  12.0 - 15.0 g/dL 11.8(L) 12.1 12.4  Hematocrit 36.0 - 46.0 % 34.8(L) 36.7 37.0  Platelets 150 - 400 K/uL 204 186 216   CMP Latest Ref Rng & Units 07/22/2020 07/20/2020 06/22/2020  Glucose 70 - 99 mg/dL 06/24/2020) 70 76  BUN 6 - 20 mg/dL 5(L) 6 7  Creatinine 130(Q - 1.00 mg/dL 6.57 8.46 9.62  Sodium 135 - 145 mmol/L 136 135 137  Potassium 3.5 - 5.1 mmol/L 3.9 3.4(L) 3.8  Chloride 98 - 111 mmol/L 106 105 107  CO2 22 - 32 mmol/L 21(L) 23 23  Calcium 8.9 - 10.3 mg/dL 9.52) 8.4(X) 8.7  Total Protein 6.5 - 8.1 g/dL 6.3(L) 6.1(L) 6.3  Total Bilirubin 0.3 - 1.2 mg/dL 0.4 0.3 0.3  Alkaline Phos 38 - 126 U/L 135(H) 137(H) -  AST 15 - 41 U/L 22 19 15   ALT 0 - 44 U/L 10 9 7    3.2(G MFM FETAL BPP WO NON STRESS  Result Date: 07/21/2020 ----------------------------------------------------------------------  OBSTETRICS REPORT                       (Signed Final 07/21/2020 03:23 pm) ---------------------------------------------------------------------- Patient Info  ID #:       Dorothy Meadows                          D.O.B.:  12-04-1985 (34 yrs)  Name:       07/23/2020                Visit Date: 07/21/2020 07:42 am ---------------------------------------------------------------------- Performed By  Attending:        01/10/85  Ref. Address:     [address]                    [place]                                                             [place], [place]  Performed By:     Percell Boston          [facility]  Referred By:      [facility] ---------------------------------------------------------------------- Orders  #  Description                           Code        Ordered By  1  Dorothy Meadows MFM OB FOLLOW UP                   [phone]    JAMES ARNOLD  2  Dorothy Meadows MFM FETAL BPP WO NON               76819.01    JAMES ARNOLD     STRESS  3  Dorothy Meadows MFM UA CORD DOPPLER                [id number]    Scheryl Darter  ----------------------------------------------------------------------  #  Order #                     Accession #                Episode #  1  [id number]                   [id number]                 [id number]  2  [id number]                   [id number]                 [id number]  3  [id number]                   [id number]                 [id number] ---------------------------------------------------------------------- Indications  Pre-eclampsia                                  O14.90  Maternal care for known or suspected poor      O36.5930  fetal growth, third trimester, not applicable or  unspecified IUGR  Polyhydramnios, third trimester, antepartum    O40.3XX0  condition or complication, unspecified fetus  [redacted] weeks gestation of pregnancy                Z3A.59  Advanced maternal age multigravida 35+,  Z61.096O09.522  second trimester  Medical complication of pregnancy (SVT)        O26.90  Poor obstetric history: Previous gestational   O09.299  diabetes ---------------------------------------------------------------------- Fetal Evaluation  Num Of Fetuses:         1  Fetal Heart Rate(bpm):  120  Cardiac Activity:       Observed  Presentation:           Breech  Placenta:               Posterior  P. Cord Insertion:      Previously Visualized  Amniotic Fluid  AFI FV:      Polyhydramnios  AFI Sum(cm)     %Tile       Largest Pocket(cm)  33.9            > 97        11.9  RUQ(cm)       RLQ(cm)       LUQ(cm)        LLQ(cm)  9.8           4.9           11.9           7.3 ---------------------------------------------------------------------- Biophysical Evaluation  Amniotic F.V:   Polyhydramnios             F. Tone:        Observed  F. Movement:    Observed                   Score:          8/8  F. Breathing:   Observed ---------------------------------------------------------------------- Biometry  BPD:      73.1  mm     G. Age:  29w 2d        < 1  %    CI:        78.38   %    70 - 86                                                           FL/HC:      21.7   %    19.9 - 21.5  HC:      261.2  mm     G. Age:  28w 3d        < 1  %    HC/AC:      0.99        0.96 - 1.11  AC:      263.2  mm     G. Age:  30w 3d        3.8  %    FL/BPD:     77.7   %    71 - 87  FL:       56.8  mm     G. Age:  29w 6d        < 1  %    FL/AC:      21.6   %    20 - 24  Est. FW:    1484  gm      3 lb 4 oz      1  % ---------------------------------------------------------------------- OB History  Gravidity:    5  Term:   3        Prem:   0        SAB:   1  TOP:          0       Ectopic:  0        Living: 3 ---------------------------------------------------------------------- Gestational Age  LMP:           36w 3d        Date:  11/09/19                 EDD:   08/15/20  U/S Today:     29w 4d                                        EDD:   10/02/20  Best:          32w 5d     Det. By:  U/S  (03/30/20)          EDD:   09/10/20 ---------------------------------------------------------------------- Anatomy  Cranium:               Appears normal         LVOT:                   Previously seen  Cavum:                 Previously seen        Aortic Arch:            Previously seen  Ventricles:            Previously seen        Ductal Arch:            Previously seen  Choroid Plexus:        Previously seen        Diaphragm:              Previously seen  Cerebellum:            Previously seen        Stomach:                Appears normal, left                                                                        sided  Posterior Fossa:       Previously seen        Abdomen:                Previously seen  Nuchal Fold:           Previously seen        Abdominal Wall:         Previously seen  Face:                  Appears normal         Cord Vessels:           Previously seen                         (  orbits and profile)  Lips:                  Previously seen        Kidneys:                Appear normal  Palate:                Previously seen        Bladder:                 Appears normal  Thoracic:              Appears normal         Spine:                  Previously seen  Heart:                 Previously seen        Upper Extremities:      Previously seen  RVOT:                  Previously seen        Lower Extremities:      Previously seen  Other:  Heels/feet and open hands/5th digits visualized previously. Nasal          bone visualized previously. Fetus appears to be female. VC, 3VV and          3VTV visualized previously. ---------------------------------------------------------------------- Doppler - Fetal Vessels  Umbilical Artery   S/D     %tile      RI    %tile      PI    %tile            ADFV    RDFV   3.54       91    0.72       91    1.17       91                N       N ---------------------------------------------------------------------- Cervix Uterus Adnexa  Cervix  Not visualized (advanced GA >24wks)  Uterus  No abnormality visualized.  Right Ovary  No adnexal mass visualized.  Left Ovary  No adnexal mass visualized.  Cul De Sac  No free fluid seen.  Adnexa  No abnormality visualized. ---------------------------------------------------------------------- Impression  Follow up growth performed due to current hospital  admission with a new diagnosis of preeclampsia vs severe  gestational hypertension requiring IV labetalol  Prior normal anatomy was documented.  Fetal growth restriction is identified with moderate  polyhydramnios.  UA Dopplers are normal without evidence of AEDF or REDF.  A biophyiscal profile of 8/8 is observed. ---------------------------------------------------------------------- Recommendations  Continue daily NST.  Magnesium with BMZ administration,  Maintain BP < 150/105.  2x weekly BPP with AFI and weekly UA Dopplers.  Consider delivery at 34 weeks given elevated blood pressure.  Repeat labs in 48 hours and if normal 2x weekly labs. ----------------------------------------------------------------------               Lin Landsman, MD  Electronically Signed Final Report   07/21/2020 03:23 pm ----------------------------------------------------------------------  Dorothy Meadows MFM OB FOLLOW UP  Result Date: 07/21/2020 ----------------------------------------------------------------------  OBSTETRICS REPORT                       (Signed Final 07/21/2020 03:23 pm) ---------------------------------------------------------------------- Patient Info  ID #:  [id number]                          D.O.B.:  1985/05/20 (34 yrs)  Name:       FOTINI LEMUS                Visit Date: 07/21/2020 07:42 am ---------------------------------------------------------------------- Performed By  Attending:        Lin Landsman      Ref. Address:     [address]                    [place]                                                             [place], [place]  Performed By:     Percell Boston          [facility]  Referred By:      [facility] ---------------------------------------------------------------------- Orders  #  Description                           Code        Ordered By  1  Dorothy Meadows MFM OB FOLLOW UP                   [phone]    JAMES ARNOLD  2  Dorothy Meadows MFM FETAL BPP WO NON               76819.01    JAMES ARNOLD     STRESS  3  Dorothy Meadows MFM UA CORD DOPPLER                76820.02    JAMES ARNOLD ----------------------------------------------------------------------  #  Order #                     Accession #                Episode #  1  [id number]                   [id number]                 [id number]  2  [id number]                   [id number]                 [id number]  3  [id number]                   [id number]                 [id number] ---------------------------------------------------------------------- Indications  Pre-eclampsia  O14.90  Maternal care for known or suspected poor      O36.5930  fetal growth, third trimester, not  applicable or  unspecified IUGR  Polyhydramnios, third trimester, antepartum    O40.3XX0  condition or complication, unspecified fetus  [redacted] weeks gestation of pregnancy                Z3A.68  Advanced maternal age multigravida 79+,        O67.522  second trimester  Medical complication of pregnancy (SVT)        O26.90  Poor obstetric history: Previous gestational   O09.299  diabetes ---------------------------------------------------------------------- Fetal Evaluation  Num Of Fetuses:         1  Fetal Heart Rate(bpm):  120  Cardiac Activity:       Observed  Presentation:           Breech  Placenta:               Posterior  P. Cord Insertion:      Previously Visualized  Amniotic Fluid  AFI FV:      Polyhydramnios  AFI Sum(cm)     %Tile       Largest Pocket(cm)  33.9            > 97        11.9  RUQ(cm)       RLQ(cm)       LUQ(cm)        LLQ(cm)  9.8           4.9           11.9           7.3 ---------------------------------------------------------------------- Biophysical Evaluation  Amniotic F.V:   Polyhydramnios             F. Tone:        Observed  F. Movement:    Observed                   Score:          8/8  F. Breathing:   Observed ---------------------------------------------------------------------- Biometry  BPD:      73.1  mm     G. Age:  29w 2d        < 1  %    CI:        78.38   %    70 - 86                                                          FL/HC:      21.7   %    19.9 - 21.5  HC:      261.2  mm     G. Age:  28w 3d        < 1  %    HC/AC:      0.99        0.96 - 1.11  AC:      263.2  mm     G. Age:  30w 3d        3.8  %    FL/BPD:     77.7   %    71 - 87  FL:       56.8  mm  G. Age:  29w 6d        < 1  %    FL/AC:      21.6   %    20 - 24  Est. FW:    1484  gm      3 lb 4 oz      1  % ---------------------------------------------------------------------- OB History  Gravidity:    5         Term:   3        Prem:   0        SAB:   1  TOP:          0       Ectopic:  0        Living: 3  ---------------------------------------------------------------------- Gestational Age  LMP:           36w 3d        Date:  11/09/19                 EDD:   08/15/20  U/S Today:     29w 4d                                        EDD:   10/02/20  Best:          32w 5d     Det. By:  U/S  (03/30/20)          EDD:   09/10/20 ---------------------------------------------------------------------- Anatomy  Cranium:               Appears normal         LVOT:                   Previously seen  Cavum:                 Previously seen        Aortic Arch:            Previously seen  Ventricles:            Previously seen        Ductal Arch:            Previously seen  Choroid Plexus:        Previously seen        Diaphragm:              Previously seen  Cerebellum:            Previously seen        Stomach:                Appears normal, left                                                                        sided  Posterior Fossa:       Previously seen        Abdomen:                Previously seen  Nuchal Fold:           Previously seen  Abdominal Wall:         Previously seen  Face:                  Appears normal         Cord Vessels:           Previously seen                         (orbits and profile)  Lips:                  Previously seen        Kidneys:                Appear normal  Palate:                Previously seen        Bladder:                Appears normal  Thoracic:              Appears normal         Spine:                  Previously seen  Heart:                 Previously seen        Upper Extremities:      Previously seen  RVOT:                  Previously seen        Lower Extremities:      Previously seen  Other:  Heels/feet and open hands/5th digits visualized previously. Nasal          bone visualized previously. Fetus appears to be female. VC, 3VV and          3VTV visualized previously. ---------------------------------------------------------------------- Doppler - Fetal Vessels  Umbilical  Artery   S/D     %tile      RI    %tile      PI    %tile            ADFV    RDFV   3.54       91    0.72       91    1.17       91                N       N ---------------------------------------------------------------------- Cervix Uterus Adnexa  Cervix  Not visualized (advanced GA >24wks)  Uterus  No abnormality visualized.  Right Ovary  No adnexal mass visualized.  Left Ovary  No adnexal mass visualized.  Cul De Sac  No free fluid seen.  Adnexa  No abnormality visualized. ---------------------------------------------------------------------- Impression  Follow up growth performed due to current hospital  admission with a new diagnosis of preeclampsia vs severe  gestational hypertension requiring IV labetalol  Prior normal anatomy was documented.  Fetal growth restriction is identified with moderate  polyhydramnios.  UA Dopplers are normal without evidence of AEDF or REDF.  A biophyiscal profile of 8/8 is observed. ---------------------------------------------------------------------- Recommendations  Continue daily NST.  Magnesium with BMZ administration,  Maintain BP < 150/105.  2x weekly BPP with AFI and weekly UA Dopplers.  Consider delivery at 34 weeks given elevated blood pressure.  Repeat labs in 48 hours and if normal 2x weekly labs. ----------------------------------------------------------------------  Lin Landsman, MD Electronically Signed Final Report   07/21/2020 03:23 pm ----------------------------------------------------------------------  Dorothy Meadows MFM UA CORD DOPPLER  Result Date: 07/21/2020 ----------------------------------------------------------------------  OBSTETRICS REPORT                       (Signed Final 07/21/2020 03:23 pm) ---------------------------------------------------------------------- Patient Info  ID #:       [id number]                          D.O.B.:  [birth year] (34 yrs)  Name:       Thompson Grayer                Visit Date: 07/21/2020 07:42 am  ---------------------------------------------------------------------- Performed By  Attending:        Lin Landsman      Ref. Address:     [address]                    [place]                                                             [place], [place]  Performed By:     Percell Boston          [facility]  Referred By:      [facility] ---------------------------------------------------------------------- Orders  #  Description                           Code        Ordered By  1  Dorothy Meadows MFM OB FOLLOW UP                   [phone]    JAMES ARNOLD  2  Dorothy Meadows MFM FETAL BPP WO NON               76819.01    JAMES ARNOLD     STRESS  3  Dorothy Meadows MFM UA CORD DOPPLER                [id number]    Scheryl Darter ----------------------------------------------------------------------  #  Order #                     Accession #                Episode #  1  [id number]                   [id number]                 [id number]  2  [id number]                   [id number]                 [id number]  3  [id number]                   [id number]                 [id number] ---------------------------------------------------------------------- Indications  Pre-eclampsia                                  O14.90  Maternal care for known or suspected poor      O36.5930  fetal growth, third trimester, not applicable or  unspecified IUGR  Polyhydramnios, third trimester, antepartum    O40.3XX0  condition or complication, unspecified fetus  [redacted] weeks gestation of pregnancy                Z3A.17  Advanced maternal age multigravida 80+,        O55.522  second trimester  Medical complication of pregnancy (SVT)        O26.90  Poor obstetric history: Previous gestational   O09.299  diabetes ---------------------------------------------------------------------- Fetal Evaluation  Num Of Fetuses:         1  Fetal Heart Rate(bpm):  120  Cardiac Activity:       Observed   Presentation:           Breech  Placenta:               Posterior  P. Cord Insertion:      Previously Visualized  Amniotic Fluid  AFI FV:      Polyhydramnios  AFI Sum(cm)     %Tile       Largest Pocket(cm)  33.9            > 97        11.9  RUQ(cm)       RLQ(cm)       LUQ(cm)        LLQ(cm)  9.8           4.9           11.9           7.3 ---------------------------------------------------------------------- Biophysical Evaluation  Amniotic F.V:   Polyhydramnios             F. Tone:        Observed  F. Movement:    Observed                   Score:          8/8  F. Breathing:   Observed ---------------------------------------------------------------------- Biometry  BPD:      73.1  mm     G. Age:  29w 2d        < 1  %    CI:        78.38   %    70 - 86                                                          FL/HC:      21.7   %    19.9 - 21.5  HC:      261.2  mm     G. Age:  28w 3d        < 1  %    HC/AC:  0.99        0.96 - 1.11  AC:      263.2  mm     G. Age:  30w 3d        3.8  %    FL/BPD:     77.7   %    71 - 87  FL:       56.8  mm     G. Age:  29w 6d        < 1  %    FL/AC:      21.6   %    20 - 24  Est. FW:    1484  gm      3 lb 4 oz      1  % ---------------------------------------------------------------------- OB History  Gravidity:    5         Term:   3        Prem:   0        SAB:   1  TOP:          0       Ectopic:  0        Living: 3 ---------------------------------------------------------------------- Gestational Age  LMP:           36w 3d        Date:  11/09/19                 EDD:   08/15/20  U/S Today:     29w 4d                                        EDD:   10/02/20  Best:          32w 5d     Det. By:  U/S  (03/30/20)          EDD:   09/10/20 ---------------------------------------------------------------------- Anatomy  Cranium:               Appears normal         LVOT:                   Previously seen  Cavum:                 Previously seen        Aortic Arch:            Previously seen   Ventricles:            Previously seen        Ductal Arch:            Previously seen  Choroid Plexus:        Previously seen        Diaphragm:              Previously seen  Cerebellum:            Previously seen        Stomach:                Appears normal, left  sided  Posterior Fossa:       Previously seen        Abdomen:                Previously seen  Nuchal Fold:           Previously seen        Abdominal Wall:         Previously seen  Face:                  Appears normal         Cord Vessels:           Previously seen                         (orbits and profile)  Lips:                  Previously seen        Kidneys:                Appear normal  Palate:                Previously seen        Bladder:                Appears normal  Thoracic:              Appears normal         Spine:                  Previously seen  Heart:                 Previously seen        Upper Extremities:      Previously seen  RVOT:                  Previously seen        Lower Extremities:      Previously seen  Other:  Heels/feet and open hands/5th digits visualized previously. Nasal          bone visualized previously. Fetus appears to be female. VC, 3VV and          3VTV visualized previously. ---------------------------------------------------------------------- Doppler - Fetal Vessels  Umbilical Artery   S/D     %tile      RI    %tile      PI    %tile            ADFV    RDFV   3.54       91    0.72       91    1.17       91                N       N ---------------------------------------------------------------------- Cervix Uterus Adnexa  Cervix  Not visualized (advanced GA >24wks)  Uterus  No abnormality visualized.  Right Ovary  No adnexal mass visualized.  Left Ovary  No adnexal mass visualized.  Cul De Sac  No free fluid seen.  Adnexa  No abnormality visualized. ---------------------------------------------------------------------- Impression  Follow up  growth performed due to current hospital  admission with a new diagnosis of preeclampsia vs severe  gestational hypertension requiring IV labetalol  Prior normal anatomy was documented.  Fetal growth restriction is identified with moderate  polyhydramnios.  UA Dopplers are normal without evidence of  AEDF or REDF.  A biophyiscal profile of 8/8 is observed. ---------------------------------------------------------------------- Recommendations  Continue daily NST.  Magnesium with BMZ administration,  Maintain BP < 150/105.  2x weekly BPP with AFI and weekly UA Dopplers.  Consider delivery at 34 weeks given elevated blood pressure.  Repeat labs in 48 hours and if normal 2x weekly labs. ----------------------------------------------------------------------               Lin Landsman, MD Electronically Signed Final Report   07/21/2020 03:23 pm ----------------------------------------------------------------------   Medications:  Scheduled . docusate sodium  100 mg Oral Daily  . NIFEdipine  30 mg Oral BID  . prenatal multivitamin  1 tablet Oral Q1200   I have reviewed the patient's current medications.  ASSESSMENT:  Principal Problem:   Preeclampsia, severe, third trimester Active Problems:   Cigarette smoker one half pack a day or less   SVT (supraventricular tachycardia) (HCC)   Supervision of high risk pregnancy, antepartum   Advanced maternal age in multigravida, second trimester   Polyhydramnios in third trimester   Severe IUGR (<1%) in third trimester   Breech presentation of fetus   Preterm uterine contractions, antepartum, third trimester   [redacted] weeks gestation of pregnancy   PLAN: Continue Procardia XL 30 mg bid for BP control. Now s/p magnesium sulfate, can restart later if needed. Maintain BP  <150/105 as per MFM. Preterm contractions likely due to moderate polyhydramnios. No cervical change. Hopefully Procardia can help with these too. Continue to monitor. 2x/week BPP with AFI  and weekly UA dopplers per MFM given severe IUGR. Breech presentation on ultrasound recently, recheck presentation if labor indicated as likely variable given fetal size and polyhydramnios She is s/p BMZ x 2. Category 1 FHR tracing. Continue NST bid. NICU consult pending. Continue inpatient care until delivery, hoping to get at 34 weeks as per MFM.   Jaynie Collins, MD 07/22/2020,6:41 AM

## 2020-07-23 ENCOUNTER — Inpatient Hospital Stay (HOSPITAL_COMMUNITY): Payer: Medicaid Other

## 2020-07-23 ENCOUNTER — Inpatient Hospital Stay (HOSPITAL_BASED_OUTPATIENT_CLINIC_OR_DEPARTMENT_OTHER): Payer: Medicaid Other

## 2020-07-23 DIAGNOSIS — O99333 Smoking (tobacco) complicating pregnancy, third trimester: Secondary | ICD-10-CM | POA: Diagnosis not present

## 2020-07-23 DIAGNOSIS — O1413 Severe pre-eclampsia, third trimester: Secondary | ICD-10-CM | POA: Diagnosis not present

## 2020-07-23 DIAGNOSIS — O365930 Maternal care for other known or suspected poor fetal growth, third trimester, not applicable or unspecified: Secondary | ICD-10-CM

## 2020-07-23 DIAGNOSIS — O1493 Unspecified pre-eclampsia, third trimester: Secondary | ICD-10-CM | POA: Diagnosis not present

## 2020-07-23 DIAGNOSIS — O321XX0 Maternal care for breech presentation, not applicable or unspecified: Secondary | ICD-10-CM

## 2020-07-23 DIAGNOSIS — O09523 Supervision of elderly multigravida, third trimester: Secondary | ICD-10-CM | POA: Diagnosis not present

## 2020-07-23 DIAGNOSIS — Z3A33 33 weeks gestation of pregnancy: Secondary | ICD-10-CM

## 2020-07-23 DIAGNOSIS — O403XX0 Polyhydramnios, third trimester, not applicable or unspecified: Secondary | ICD-10-CM

## 2020-07-23 DIAGNOSIS — O09293 Supervision of pregnancy with other poor reproductive or obstetric history, third trimester: Secondary | ICD-10-CM

## 2020-07-23 DIAGNOSIS — O99413 Diseases of the circulatory system complicating pregnancy, third trimester: Secondary | ICD-10-CM | POA: Diagnosis not present

## 2020-07-23 DIAGNOSIS — F17210 Nicotine dependence, cigarettes, uncomplicated: Secondary | ICD-10-CM | POA: Diagnosis not present

## 2020-07-23 MED ORDER — OXYCODONE-ACETAMINOPHEN 5-325 MG PO TABS
1.0000 | ORAL_TABLET | Freq: Once | ORAL | Status: AC
  Administered 2020-07-23: 1 via ORAL
  Filled 2020-07-23: qty 1

## 2020-07-23 MED ORDER — CYCLOBENZAPRINE HCL 10 MG PO TABS
10.0000 mg | ORAL_TABLET | Freq: Once | ORAL | Status: AC
  Administered 2020-07-23: 10 mg via ORAL
  Filled 2020-07-23: qty 1

## 2020-07-23 MED ORDER — BUTALBITAL-APAP-CAFFEINE 50-325-40 MG PO TABS
1.0000 | ORAL_TABLET | Freq: Once | ORAL | Status: AC
  Administered 2020-07-23: 1 via ORAL
  Filled 2020-07-23: qty 1

## 2020-07-23 NOTE — Progress Notes (Signed)
Patient ID: Dorothy Meadows, female   DOB: 09/11/1985, 35 y.o.   MRN: [id number]  FACULTY PRACTICE ANTEPARTUM(COMPREHENSIVE) NOTE  Dorothy Meadows is a 34 y.o. [id number] at [redacted]w[redacted]d by early ultrasound who is admitted for severe preeclampsia by BP criterion.   Fetal presentation is unsure. Length of Stay:  0  Days  Subjective: Patient denies any headaches, visual symptoms, RUQ/epigastric pain or other concerning symptoms. Patient reports uterine contraction activity as irregular. No pelvic pressure Patient reports the fetal movement as active. Patient reports  vaginal bleeding as none. Patient describes fluid per vagina as none.  Vitals:  Blood pressure (!) 148/88, pulse 73, temperature 98.5 F (36.9 C), temperature source Oral, resp. rate 16, height  (1.753 m), weight 106.4 kg, last menstrual period 11/09/2019, SpO2 100 %. Physical Examination: General appearance - alert, well appearing, and in no distress Heart - normal rate and regular rhythm Abdomen - soft, nontender, nondistended Fundal Height:  size equals dates Cervical Exam: deferred Extremities: extremities normal, atraumatic, no cyanosis or edema and Homans sign is negative, no sign of DVT Membranes:intact  Fetal Monitoring:   FHT: baseline 140, mod variability, +accels, no decels TOCO: uterine irritability  Labs and Imaging:  CBC Latest Ref Rng & Units 07/22/2020 07/20/2020 06/22/2020  WBC 4.0 - 10.5 K/uL 19.3(H) 12.6(H) 12.8(H)  Hemoglobin 12.0 - 15.0 g/dL 11.8(L) 12.1 12.4  Hematocrit 36.0 - 46.0 % 34.8(L) 36.7 37.0  Platelets 150 - 400 K/uL 204 186 216   CMP Latest Ref Rng & Units 07/22/2020 07/20/2020 06/22/2020  Glucose 70 - 99 mg/dL 147(W) 70 76  BUN 6 - 20 mg/dL 5(L) 6 7  Creatinine 2.95 - 1.00 mg/dL 6.21 3.08 6.57  Sodium 135 - 145 mmol/L 136 135 137  Potassium 3.5 - 5.1 mmol/L 3.9 3.4(L) 3.8  Chloride 98 - 111 mmol/L 106 105 107  CO2 22 - 32 mmol/L 21(L) 23 23  Calcium 8.9 - 10.3 mg/dL 8.4(O) 9.6(E) 8.7   Total Protein 6.5 - 8.1 g/dL 6.3(L) 6.1(L) 6.3  Total Bilirubin 0.3 - 1.2 mg/dL 0.4 0.3 0.3  Alkaline Phos 38 - 126 U/L 135(H) 137(H) -  AST 15 - 41 U/L ALT 0 - 44 U/L [name] MFM FETAL BPP WO NON STRESS  Result Date: 07/21/2020 ----------------------------------------------------------------------  OBSTETRICS REPORT                       (Signed Final 07/21/2020 03:23 pm) ---------------------------------------------------------------------- Patient Info  ID #:       [id number]                          D.O.B.:  [birth year] (34 yrs)  Name:       Dorothy Meadows                Visit Date: 07/21/2020 07:42 am ---------------------------------------------------------------------- Performed By  Attending:        Lin Landsman      Ref. Address:     [address] [place]                    [place]  Performed By:     Percell BostonHeather Waken          [facility]  Referred By:      Everardo AllWH Felsenthal ---------------------------------------------------------------------- Orders  #  Description                           Code        Ordered By  1  US MFM OB FOLLOW UP                   [phone]    JAMES ARNOLD  2  US MFM FETAL BPP WO NON               76819.01    JAMES ARNOLD     STRESS  3  US MFM UA CORD DOPPLER                76820.02    Scheryl DarterJAMES ARNOLD ----------------------------------------------------------------------  #  Order #                     Accession #                Episode #  1  [id number]                   [id number]                 [id number]  2  [id number]                   [id number]                 [id number]  3  [id number]                   [id number]                 [id number] ---------------------------------------------------------------------- Indications  Pre-eclampsia                                  O14.90  Maternal care for known  or suspected poor      O36.5930  fetal growth, third trimester, not applicable or  unspecified IUGR  Polyhydramnios, third trimester, antepartum    O40.3XX0  condition or complication, unspecified fetus  [redacted] weeks gestation of pregnancy                Z3A.7332  Advanced maternal age multigravida 6835+,        75O09.522  second trimester  Medical complication of pregnancy (SVT)        O26.90  Poor obstetric history: Previous gestational   O09.299  diabetes ---------------------------------------------------------------------- Fetal Evaluation  Num Of Fetuses:         1  Fetal Heart Rate(bpm):  120  Cardiac Activity:       Observed  Presentation:           Breech  Placenta:  Posterior  P. Cord Insertion:      Previously Visualized  Amniotic Fluid  AFI FV:      Polyhydramnios  AFI Sum(cm)     %Tile       Largest Pocket(cm)  33.9            > 97        11.9  RUQ(cm)       RLQ(cm)       LUQ(cm)        LLQ(cm)  9.8           4.9           11.9           7.3 ---------------------------------------------------------------------- Biophysical Evaluation  Amniotic F.V:   Polyhydramnios             F. Tone:        Observed  F. Movement:    Observed                   Score:          8/8  F. Breathing:   Observed ---------------------------------------------------------------------- Biometry  BPD:      73.1  mm     G. Age:  29w 2d        < 1  %    CI:        78.38   %    70 - 86                                                          FL/HC:      21.7   %    19.9 - 21.5  HC:      261.2  mm     G. Age:  28w 3d        < 1  %    HC/AC:      0.99        0.96 - 1.11  AC:      263.2  mm     G. Age:  30w 3d        3.8  %    FL/BPD:     77.7   %    71 - 87  FL:       56.8  mm     G. Age:  29w 6d        < 1  %    FL/AC:      21.6   %    20 - 24  Est. FW:    1484  gm      3 lb 4 oz      1  % ---------------------------------------------------------------------- OB History  Gravidity:    5         Term:   3        Prem:   0         SAB:   1  TOP:          0       Ectopic:  0        Living: 3 ---------------------------------------------------------------------- Gestational Age  LMP:           36w 3d        Date:  11/09/19  EDD:   08/15/20  U/S Today:     29w 4d                                        EDD:   10/02/20  Best:          32w 5d     Det. By:  U/S  (03/30/20)          EDD:   09/10/20 ---------------------------------------------------------------------- Anatomy  Cranium:               Appears normal         LVOT:                   Previously seen  Cavum:                 Previously seen        Aortic Arch:            Previously seen  Ventricles:            Previously seen        Ductal Arch:            Previously seen  Choroid Plexus:        Previously seen        Diaphragm:              Previously seen  Cerebellum:            Previously seen        Stomach:                Appears normal, left                                                                        sided  Posterior Fossa:       Previously seen        Abdomen:                Previously seen  Nuchal Fold:           Previously seen        Abdominal Wall:         Previously seen  Face:                  Appears normal         Cord Vessels:           Previously seen                         (orbits and profile)  Lips:                  Previously seen        Kidneys:                Appear normal  Palate:                Previously seen        Bladder:                Appears normal  Thoracic:  Appears normal         Spine:                  Previously seen  Heart:                 Previously seen        Upper Extremities:      Previously seen  RVOT:                  Previously seen        Lower Extremities:      Previously seen  Other:  Heels/feet and open hands/5th digits visualized previously. Nasal          bone visualized previously. Fetus appears to be female. VC, 3VV and          3VTV visualized previously.  ---------------------------------------------------------------------- Doppler - Fetal Vessels  Umbilical Artery   S/D     %tile      RI    %tile      PI    %tile            ADFV    RDFV   3.54       91    0.72       91    1.17       91                N       N ---------------------------------------------------------------------- Cervix Uterus Adnexa  Cervix  Not visualized (advanced GA >24wks)  Uterus  No abnormality visualized.  Right Ovary  No adnexal mass visualized.  Left Ovary  No adnexal mass visualized.  Cul De Sac  No free fluid seen.  Adnexa  No abnormality visualized. ---------------------------------------------------------------------- Impression  Follow up growth performed due to current hospital  admission with a new diagnosis of preeclampsia vs severe  gestational hypertension requiring IV labetalol  Prior normal anatomy was documented.  Fetal growth restriction is identified with moderate  polyhydramnios.  UA Dopplers are normal without evidence of AEDF or REDF.  A biophyiscal profile of 8/8 is observed. ---------------------------------------------------------------------- Recommendations  Continue daily NST.  Magnesium with BMZ administration,  Maintain BP < 150/105.  2x weekly BPP with AFI and weekly UA Dopplers.  Consider delivery at 34 weeks given elevated blood pressure.  Repeat labs in 48 hours and if normal 2x weekly labs. ----------------------------------------------------------------------               Lin Landsman, MD Electronically Signed Final Report   07/21/2020 03:23 pm ----------------------------------------------------------------------  [name] MFM OB FOLLOW UP  Result Date: 07/21/2020 ----------------------------------------------------------------------  OBSTETRICS REPORT                       (Signed Final 07/21/2020 03:23 pm) ---------------------------------------------------------------------- Patient Info  ID #:       [id number]                          D.O.B.:   1985/07/17 (34 yrs)  Name:       Dorothy Meadows                Visit Date: 07/21/2020 07:42 am ---------------------------------------------------------------------- Performed By  Attending:        Lin Landsman      Ref. Address:     [address]  [place]                                                             [place], [place]  Performed By:     Percell Boston          [facility]  Referred By:      [facility] ---------------------------------------------------------------------- Orders  #  Description                           Code        Ordered By  1  [name] MFM OB FOLLOW UP                   [phone]    JAMES ARNOLD  2  [name] MFM FETAL BPP WO NON               76819.01    JAMES ARNOLD     STRESS  3  [name] MFM UA CORD DOPPLER                76820.02    Scheryl Darter ----------------------------------------------------------------------  #  Order #                     Accession #                Episode #  1  [id number]                   [id number]                 [id number]  2  [id number]                   [id number]  3  [id number]                   [id number]                 [id number] ---------------------------------------------------------------------- Indications  Pre-eclampsia                                  O14.90  Maternal care for known or suspected poor      O36.5930  fetal growth, third trimester, not applicable or  unspecified IUGR  Polyhydramnios, third trimester, antepartum    O40.3XX0  condition or complication, unspecified fetus  [redacted] weeks gestation of pregnancy                Z3A.71  Advanced maternal age multigravida 71+,        O79.522  second trimester  Medical complication of pregnancy (SVT)        O26.90  Poor obstetric history: Previous gestational  O09.299  diabetes ---------------------------------------------------------------------- Fetal Evaluation   Num Of Fetuses:         1  Fetal Heart Rate(bpm):  120  Cardiac Activity:       Observed  Presentation:           Breech  Placenta:               Posterior  P. Cord Insertion:      Previously Visualized  Amniotic Fluid  AFI FV:      Polyhydramnios  AFI Sum(cm)     %Tile       Largest Pocket(cm)  33.9            > 97        11.9  RUQ(cm)       RLQ(cm)       LUQ(cm)        LLQ(cm)  9.8           4.9           11.9           7.3 ---------------------------------------------------------------------- Biophysical Evaluation  Amniotic F.V:   Polyhydramnios             F. Tone:        Observed  F. Movement:    Observed                   Score:          8/8  F. Breathing:   Observed ---------------------------------------------------------------------- Biometry  BPD:      73.1  mm     G. Age:  29w 2d        < 1  %    CI:        78.38   %    70 - 86                                                          FL/HC:      21.7   %    19.9 - 21.5  HC:      261.2  mm     G. Age:  28w 3d        < 1  %    HC/AC:      0.99        0.96 - 1.11  AC:      263.2  mm     G. Age:  30w 3d        3.8  %    FL/BPD:     77.7   %    71 - 87  FL:       56.8  mm     G. Age:  29w 6d        < 1  %    FL/AC:      21.6   %    20 - 24  Est. FW:    1484  gm      3 lb 4 oz      1  % ---------------------------------------------------------------------- OB History  Gravidity:    5         Term:   3        Prem:   0        SAB:  1  TOP:          0       Ectopic:  0        Living: 3 ---------------------------------------------------------------------- Gestational Age  LMP:           36w 3d        Date:  11/09/19                 EDD:   08/15/20  U/S Today:     29w 4d                                        EDD:   10/02/20  Best:          32w 5d     Det. By:  U/S  (03/30/20)          EDD:   09/10/20 ---------------------------------------------------------------------- Anatomy  Cranium:               Appears normal         LVOT:                   Previously  seen  Cavum:                 Previously seen        Aortic Arch:            Previously seen  Ventricles:            Previously seen        Ductal Arch:            Previously seen  Choroid Plexus:        Previously seen        Diaphragm:              Previously seen  Cerebellum:            Previously seen        Stomach:                Appears normal, left                                                                        sided  Posterior Fossa:       Previously seen        Abdomen:                Previously seen  Nuchal Fold:           Previously seen        Abdominal Wall:         Previously seen  Face:                  Appears normal         Cord Vessels:           Previously seen                         (orbits and profile)  Lips:  Previously seen        Kidneys:                Appear normal  Palate:                Previously seen        Bladder:                Appears normal  Thoracic:              Appears normal         Spine:                  Previously seen  Heart:                 Previously seen        Upper Extremities:      Previously seen  RVOT:                  Previously seen        Lower Extremities:      Previously seen  Other:  Heels/feet and open hands/5th digits visualized previously. Nasal          bone visualized previously. Fetus appears to be female. VC, 3VV and          3VTV visualized previously. ---------------------------------------------------------------------- Doppler - Fetal Vessels  Umbilical Artery   S/D     %tile      RI    %tile      PI    %tile            ADFV    RDFV   3.54       91    0.72       91    1.17       91                N       N ---------------------------------------------------------------------- Cervix Uterus Adnexa  Cervix  Not visualized (advanced GA >24wks)  Uterus  No abnormality visualized.  Right Ovary  No adnexal mass visualized.  Left Ovary  No adnexal mass visualized.  Cul De Sac  No free fluid seen.  Adnexa  No abnormality visualized.  ---------------------------------------------------------------------- Impression  Follow up growth performed due to current hospital  admission with a new diagnosis of preeclampsia vs severe  gestational hypertension requiring IV labetalol  Prior normal anatomy was documented.  Fetal growth restriction is identified with moderate  polyhydramnios.  UA Dopplers are normal without evidence of AEDF or REDF.  A biophyiscal profile of 8/8 is observed. ---------------------------------------------------------------------- Recommendations  Continue daily NST.  Magnesium with BMZ administration,  Maintain BP < 150/105.  2x weekly BPP with AFI and weekly UA Dopplers.  Consider delivery at 34 weeks given elevated blood pressure.  Repeat labs in 48 hours and if normal 2x weekly labs. ----------------------------------------------------------------------               Lin Landsman, MD Electronically Signed Final Report   07/21/2020 03:23 pm ----------------------------------------------------------------------  [name] MFM UA CORD DOPPLER  Result Date: 07/21/2020 ----------------------------------------------------------------------  OBSTETRICS REPORT                       (Signed Final 07/21/2020 03:23 pm) ---------------------------------------------------------------------- Patient Info  ID #:       [id number]  D.O.B.:  04-10-1986 (34 yrs)  Name:       Dorothy Meadows                Visit Date: 07/21/2020 07:42 am ---------------------------------------------------------------------- Performed By  Attending:        Lin Landsman      Ref. Address:     [address]                    [place]                                                             [place], [place]  Performed By:     Percell Boston          [facility]  Referred By:      [facility]  ---------------------------------------------------------------------- Orders  #  Description                           Code        Ordered By  1  [name] MFM OB FOLLOW UP                   [id number]    JAMES ARNOLD  2  [name] MFM FETAL BPP WO NON               76819.01    JAMES ARNOLD     STRESS  3  [name] MFM UA CORD DOPPLER                [id number]    Scheryl Darter ----------------------------------------------------------------------  #  Order #                     Accession #                Episode #  1  [id number]                   [id number]                 [id number]  2  [id number]                   [id number]                 [id number]  3  [id number]                   [id number]                 [id number] ---------------------------------------------------------------------- Indications  Pre-eclampsia                                  O14.90  Maternal care for known or suspected poor      O36.5930  fetal growth, third trimester, not applicable or  unspecified IUGR  Polyhydramnios, third trimester, antepartum    O40.3XX0  condition or complication, unspecified fetus  [redacted] weeks gestation of pregnancy                Z3A.62  Advanced maternal age multigravida 54+,        O52.522  second trimester  Medical complication of pregnancy (SVT)        O26.90  Poor obstetric history: Previous gestational   O09.299  diabetes ---------------------------------------------------------------------- Fetal Evaluation  Num Of Fetuses:         1  Fetal Heart Rate(bpm):  120  Cardiac Activity:       Observed  Presentation:           Breech  Placenta:               Posterior  P. Cord Insertion:      Previously Visualized  Amniotic Fluid  AFI FV:      Polyhydramnios  AFI Sum(cm)     %Tile       Largest Pocket(cm)  33.9            > 97        11.9  RUQ(cm)       RLQ(cm)       LUQ(cm)        LLQ(cm)  9.8           4.9           11.9           7.3 ---------------------------------------------------------------------- Biophysical Evaluation  Amniotic F.V:    Polyhydramnios             F. Tone:        Observed  F. Movement:    Observed                   Score:          8/8  F. Breathing:   Observed ---------------------------------------------------------------------- Biometry  BPD:      73.1  mm     G. Age:  29w 2d        < 1  %    CI:        78.38   %    70 - 86                                                          FL/HC:      21.7   %    19.9 - 21.5  HC:      261.2  mm     G. Age:  28w 3d        < 1  %    HC/AC:      0.99        0.96 - 1.11  AC:      263.2  mm     G. Age:  30w 3d        3.8  %    FL/BPD:     77.7   %    71 - 87  FL:       56.8  mm     G. Age:  29w 6d        < 1  %  FL/AC:      21.6   %    20 - 24  Est. FW:    1484  gm      3 lb 4 oz      1  % ---------------------------------------------------------------------- OB History  Gravidity:    5         Term:   3        Prem:   0        SAB:   1  TOP:          0       Ectopic:  0        Living: 3 ---------------------------------------------------------------------- Gestational Age  LMP:           36w 3d        Date:  11/09/19                 EDD:   08/15/20  U/S Today:     29w 4d                                        EDD:   10/02/20  Best:          32w 5d     Det. By:  U/S  (03/30/20)          EDD:   09/10/20 ---------------------------------------------------------------------- Anatomy  Cranium:               Appears normal         LVOT:                   Previously seen  Cavum:                 Previously seen        Aortic Arch:            Previously seen  Ventricles:            Previously seen        Ductal Arch:            Previously seen  Choroid Plexus:        Previously seen        Diaphragm:              Previously seen  Cerebellum:            Previously seen        Stomach:                Appears normal, left                                                                        sided  Posterior Fossa:       Previously seen        Abdomen:                Previously seen  Nuchal Fold:            Previously seen        Abdominal Wall:         Previously seen  Face:  Appears normal         Cord Vessels:           Previously seen                         (orbits and profile)  Lips:                  Previously seen        Kidneys:                Appear normal  Palate:                Previously seen        Bladder:                Appears normal  Thoracic:              Appears normal         Spine:                  Previously seen  Heart:                 Previously seen        Upper Extremities:      Previously seen  RVOT:                  Previously seen        Lower Extremities:      Previously seen  Other:  Heels/feet and open hands/5th digits visualized previously. Nasal          bone visualized previously. Fetus appears to be female. VC, 3VV and          3VTV visualized previously. ---------------------------------------------------------------------- Doppler - Fetal Vessels  Umbilical Artery   S/D     %tile      RI    %tile      PI    %tile            ADFV    RDFV   3.54       91    0.72       91    1.17       91                N       N ---------------------------------------------------------------------- Cervix Uterus Adnexa  Cervix  Not visualized (advanced GA >24wks)  Uterus  No abnormality visualized.  Right Ovary  No adnexal mass visualized.  Left Ovary  No adnexal mass visualized.  Cul De Sac  No free fluid seen.  Adnexa  No abnormality visualized. ---------------------------------------------------------------------- Impression  Follow up growth performed due to current hospital  admission with a new diagnosis of preeclampsia vs severe  gestational hypertension requiring IV labetalol  Prior normal anatomy was documented.  Fetal growth restriction is identified with moderate  polyhydramnios.  UA Dopplers are normal without evidence of AEDF or REDF.  A biophyiscal profile of 8/8 is observed. ---------------------------------------------------------------------- Recommendations   Continue daily NST.  Magnesium with BMZ administration,  Maintain BP < 150/105.  2x weekly BPP with AFI and weekly UA Dopplers.  Consider delivery at 34 weeks given elevated blood pressure.  Repeat labs in 48 hours and if normal 2x weekly labs. ----------------------------------------------------------------------               Lin Landsman, MD Electronically Signed Final Report   07/21/2020 03:23 pm ----------------------------------------------------------------------   Medications:  Scheduled . docusate sodium  100 mg Oral Daily  . NIFEdipine  60 mg Oral BID  . pantoprazole  40 mg Oral Daily  . prenatal multivitamin  1 tablet Oral Q1200  . sodium chloride flush  3 mL Intravenous Q12H   I have reviewed the patient's current medications.  ASSESSMENT:  Principal Problem:   Preeclampsia, severe, third trimester Active Problems:   Cigarette smoker one half pack a day or less   SVT (supraventricular tachycardia) (HCC)   Supervision of high risk pregnancy, antepartum   Advanced maternal age in multigravida, second trimester   Polyhydramnios in third trimester   Severe IUGR (<1%) in third trimester   Breech presentation of fetus   Preterm uterine contractions, antepartum, third trimester   [redacted] weeks gestation of pregnancy   PLAN: Continue Procardia XL 60 mg bid for BP control. Now s/p magnesium sulfate, can restart later if needed. Maintain BP  <150/105 as per MFM. Follow up BPP today She is s/p BMZ x 2. and NICU consult. Continue inpatient care until delivery, hoping to get at 34 weeks as per MFM.   Catalina Antigua, MD 07/23/2020,8:44 AM

## 2020-07-24 DIAGNOSIS — O99333 Smoking (tobacco) complicating pregnancy, third trimester: Secondary | ICD-10-CM | POA: Diagnosis not present

## 2020-07-24 DIAGNOSIS — F17210 Nicotine dependence, cigarettes, uncomplicated: Secondary | ICD-10-CM | POA: Diagnosis not present

## 2020-07-24 DIAGNOSIS — O1413 Severe pre-eclampsia, third trimester: Secondary | ICD-10-CM | POA: Diagnosis not present

## 2020-07-24 DIAGNOSIS — O99413 Diseases of the circulatory system complicating pregnancy, third trimester: Secondary | ICD-10-CM | POA: Diagnosis not present

## 2020-07-24 LAB — COMPREHENSIVE METABOLIC PANEL
ALT: 18 U/L (ref 0–44)
AST: 38 U/L (ref 15–41)
Albumin: 2.6 g/dL — ABNORMAL LOW (ref 3.5–5.0)
Alkaline Phosphatase: 141 U/L — ABNORMAL HIGH (ref 38–126)
Anion gap: 7 (ref 5–15)
BUN: 9 mg/dL (ref 6–20)
CO2: 23 mmol/L (ref 22–32)
Calcium: 8.6 mg/dL — ABNORMAL LOW (ref 8.9–10.3)
Chloride: 106 mmol/L (ref 98–111)
Creatinine, Ser: 0.69 mg/dL (ref 0.44–1.00)
GFR, Estimated: >60 mL/min (ref >60)
Glucose, Bld: 85 mg/dL (ref 70–99)
Potassium: 3.4 mmol/L — ABNORMAL LOW (ref 3.5–5.1)
Sodium: 136 mmol/L (ref 135–145)
Total Bilirubin: 0.6 mg/dL (ref 0.3–1.2)
Total Protein: 6.4 g/dL — ABNORMAL LOW (ref 6.5–8.1)

## 2020-07-24 LAB — CBC
HCT: 38.6 % (ref 36.0–46.0)
Hemoglobin: 13.1 g/dL (ref 12.0–15.0)
MCH: 30.5 pg (ref 26.0–34.0)
MCHC: 33.9 g/dL (ref 30.0–36.0)
MCV: 89.8 fL (ref 80.0–100.0)
Platelets: 201 10*3/uL (ref 150–400)
RBC: 4.3 MIL/uL (ref 3.87–5.11)
RDW: 14 % (ref 11.5–15.5)
WBC: 18 10*3/uL — ABNORMAL HIGH (ref 4.0–10.5)
nRBC: 0.1 % (ref 0.0–0.2)

## 2020-07-24 MED ORDER — TERBUTALINE SULFATE 1 MG/ML IJ SOLN
0.2500 mg | Freq: Once | INTRAMUSCULAR | Status: AC
  Administered 2020-07-24: 0.25 mg via SUBCUTANEOUS
  Filled 2020-07-24: qty 1

## 2020-07-24 MED ORDER — SODIUM CHLORIDE 0.9 % IV BOLUS
500.0000 mL | Freq: Once | INTRAVENOUS | Status: AC
  Administered 2020-07-24: 500 mL via INTRAVENOUS

## 2020-07-24 MED ORDER — CYCLOBENZAPRINE HCL 10 MG PO TABS: 10.0000 mg | ORAL_TABLET | Freq: Three times a day (TID) | ORAL | Status: DC | PRN

## 2020-07-24 MED ORDER — LACTATED RINGERS IV SOLN
INTRAVENOUS | Status: DC
  Administered 2020-07-24 – 2020-07-25 (×2): 125 mL/h via INTRAVENOUS

## 2020-07-24 MED ORDER — NIFEDIPINE 10 MG PO CAPS
10.0000 mg | ORAL_CAPSULE | Freq: Once | ORAL | Status: AC
  Administered 2020-07-24: 10 mg via ORAL
  Filled 2020-07-24: qty 1

## 2020-07-24 MED ORDER — POTASSIUM CHLORIDE CRYS ER 20 MEQ PO TBCR
20.0000 meq | EXTENDED_RELEASE_TABLET | Freq: Two times a day (BID) | ORAL | Status: DC
  Administered 2020-07-24 – 2020-07-25 (×3): 20 meq via ORAL
  Filled 2020-07-24 (×3): qty 1

## 2020-07-24 NOTE — Progress Notes (Signed)
Patient ID: Dorothy Meadows, female   DOB: [birth year], 35 y.o.   MRN: [id number] [facility] Attending CTSP d/t ut ctx. Pt give PO procardia 10 mg x 1 SVE closed Pt reports feeling better, only feels an occ ut  Ctx and not painful. No bleeding or LOF Denies HA or visual changes Will start IV fluids and and give Sq terb x 1. Continue with current managemenrt

## 2020-07-24 NOTE — Progress Notes (Signed)
Dr Alysia Penna notified of light bloody, vaginal drainage noted when voiding with a grape size clot noted.  Patient denies any pain.  No new orders.

## 2020-07-24 NOTE — Progress Notes (Signed)
Patient ID: Dorothy Meadows, female   DOB: 23-Feb-1986, 35 y.o.   MRN: [id number] FACULTY PRACTICE ANTEPARTUM(COMPREHENSIVE) NOTE  Dorothy Meadows is a 35 y.o. [id number] at [redacted]w[redacted]d by best clinical estimate who is admitted for pre-eclampsia with severe features.   Fetal presentation is breech. Length of Stay:  0  Days  ASSESSMENT: Principal Problem:   Preeclampsia, severe, third trimester Active Problems:   Cigarette smoker one half pack a day or less   SVT (supraventricular tachycardia) (HCC)   Supervision of high risk pregnancy, antepartum   Advanced maternal age in multigravida, second trimester   Polyhydramnios in third trimester   Severe IUGR (<1%) in third trimester   Breech presentation of fetus   Preterm uterine contractions, antepartum, third trimester   [redacted] weeks gestation of pregnancy   PLAN: S/p Magnesium - will resume peri-delivery Procardia 60 mg XL bid and maximize BP control Flexeril for headache today, if not improved may need delivery. S/p BMZ BPP 8/8 yesterday Repeat labs    Subjective: Reports on-going headache--gets better for a while, but then worse. Also with abdominal pain today, no RUQ, no scotomata. Patient reports the fetal movement as active. Patient reports uterine contraction  activity as none. Patient reports  vaginal bleeding as none. Patient describes fluid per vagina as None.  Vitals:  Blood pressure (!) 155/90, pulse 90, temperature 98.1 F (36.7 C), temperature source Oral, resp. rate 17, height 5\' 9"  (1.753 m), weight 106.4 kg, last menstrual period 11/09/2019, SpO2 100 %. Physical Examination:  General appearance - alert, well appearing, and in no distress Chest - normal effort Abdomen - gravid, non-tender Fundal Height:  size less than dates Extremities: Homans sign is negative, no sign of DVT  Membranes:intact  Fetal Monitoring:  Baseline: 135 bpm, Variability: Good {> 6 bpm), Accelerations: Reactive and Decelerations:  Absent    Medications:  Scheduled . docusate sodium  100 mg Oral Daily  . NIFEdipine  60 mg Oral BID  . pantoprazole  40 mg Oral Daily  . prenatal multivitamin  1 tablet Oral Q1200  . sodium chloride flush  3 mL Intravenous Q12H   I have reviewed the patient's current medications.   01/10/2020, MD 07/24/2020,7:57 AM

## 2020-07-24 NOTE — Progress Notes (Signed)
Patient ambulated off of unit without notifying nursing staff. Patient educated to stay in room and rest d/t BP and documented ctx this AM.

## 2020-07-25 ENCOUNTER — Inpatient Hospital Stay (HOSPITAL_COMMUNITY): Payer: Medicaid Other | Admitting: Anesthesiology

## 2020-07-25 ENCOUNTER — Encounter (HOSPITAL_COMMUNITY): Payer: Self-pay | Admitting: Obstetrics and Gynecology

## 2020-07-25 ENCOUNTER — Encounter (HOSPITAL_COMMUNITY)
Admission: AD | Disposition: A | Discharge status: Home / Self Care | Payer: Self-pay | Source: Home / Self Care | Attending: Obstetrics and Gynecology

## 2020-07-25 DIAGNOSIS — O321XX0 Maternal care for breech presentation, not applicable or unspecified: Secondary | ICD-10-CM | POA: Diagnosis present

## 2020-07-25 DIAGNOSIS — O365930 Maternal care for other known or suspected poor fetal growth, third trimester, not applicable or unspecified: Secondary | ICD-10-CM | POA: Diagnosis present

## 2020-07-25 DIAGNOSIS — O403XX0 Polyhydramnios, third trimester, not applicable or unspecified: Secondary | ICD-10-CM | POA: Diagnosis present

## 2020-07-25 DIAGNOSIS — Z3A32 32 weeks gestation of pregnancy: Secondary | ICD-10-CM | POA: Diagnosis not present

## 2020-07-25 DIAGNOSIS — O4593 Premature separation of placenta, unspecified, third trimester: Secondary | ICD-10-CM | POA: Diagnosis present

## 2020-07-25 DIAGNOSIS — O9942 Diseases of the circulatory system complicating childbirth: Secondary | ICD-10-CM | POA: Diagnosis present

## 2020-07-25 DIAGNOSIS — Z23 Encounter for immunization: Secondary | ICD-10-CM | POA: Diagnosis not present

## 2020-07-25 DIAGNOSIS — Z20822 Contact with and (suspected) exposure to covid-19: Secondary | ICD-10-CM | POA: Diagnosis present

## 2020-07-25 DIAGNOSIS — Z3A33 33 weeks gestation of pregnancy: Secondary | ICD-10-CM

## 2020-07-25 DIAGNOSIS — I471 Supraventricular tachycardia: Secondary | ICD-10-CM | POA: Diagnosis present

## 2020-07-25 DIAGNOSIS — O1414 Severe pre-eclampsia complicating childbirth: Secondary | ICD-10-CM | POA: Diagnosis present

## 2020-07-25 DIAGNOSIS — O99334 Smoking (tobacco) complicating childbirth: Secondary | ICD-10-CM | POA: Diagnosis present

## 2020-07-25 DIAGNOSIS — F17210 Nicotine dependence, cigarettes, uncomplicated: Secondary | ICD-10-CM | POA: Diagnosis present

## 2020-07-25 LAB — CBC
HCT: 35.6 % — ABNORMAL LOW (ref 36.0–46.0)
Hemoglobin: 12.5 g/dL (ref 12.0–15.0)
MCH: 31.1 pg (ref 26.0–34.0)
MCHC: 35.1 g/dL (ref 30.0–36.0)
MCV: 88.6 fL (ref 80.0–100.0)
Platelets: 196 10*3/uL (ref 150–400)
RBC: 4.02 MIL/uL (ref 3.87–5.11)
RDW: 13.9 % (ref 11.5–15.5)
WBC: 15.7 10*3/uL — ABNORMAL HIGH (ref 4.0–10.5)
nRBC: 0 % (ref 0.0–0.2)

## 2020-07-25 LAB — TYPE AND SCREEN
ABO/RH(D): A POS
Antibody Screen: NEGATIVE

## 2020-07-25 SURGERY — Surgical Case
Anesthesia: Spinal

## 2020-07-25 MED ORDER — FENTANYL CITRATE (PF) 100 MCG/2ML IJ SOLN
INTRAMUSCULAR | Status: AC
  Filled 2020-07-25: qty 2

## 2020-07-25 MED ORDER — SOD CITRATE-CITRIC ACID 500-334 MG/5ML PO SOLN: 30.0000 mL | Freq: Once | ORAL | Status: AC

## 2020-07-25 MED ORDER — TRANEXAMIC ACID-NACL 1000-0.7 MG/100ML-% IV SOLN
INTRAVENOUS | Status: DC | PRN
  Administered 2020-07-25: 1000 mg via INTRAVENOUS

## 2020-07-25 MED ORDER — DIPHENHYDRAMINE HCL 50 MG/ML IJ SOLN
INTRAMUSCULAR | Status: AC
  Filled 2020-07-25: qty 1

## 2020-07-25 MED ORDER — SCOPOLAMINE 1 MG/3DAYS TD PT72
MEDICATED_PATCH | TRANSDERMAL | Status: DC | PRN
  Administered 2020-07-25: 1 via TRANSDERMAL

## 2020-07-25 MED ORDER — BUPIVACAINE HCL (PF) 0.5 % IJ SOLN
INTRAMUSCULAR | Status: AC
  Filled 2020-07-25: qty 30

## 2020-07-25 MED ORDER — MEPERIDINE HCL 25 MG/ML IJ SOLN
6.2500 mg | INTRAMUSCULAR | Status: DC | PRN
Start: 2020-07-25 — End: 2020-07-26

## 2020-07-25 MED ORDER — PHENYLEPHRINE HCL-NACL 20-0.9 MG/250ML-% IV SOLN
INTRAVENOUS | Status: DC | PRN
  Administered 2020-07-25: 60 ug/min via INTRAVENOUS

## 2020-07-25 MED ORDER — ONDANSETRON HCL 4 MG/2ML IJ SOLN
INTRAMUSCULAR | Status: AC
  Filled 2020-07-25: qty 2

## 2020-07-25 MED ORDER — LACTATED RINGERS IV SOLN: INTRAVENOUS | Status: DC

## 2020-07-25 MED ORDER — MAGNESIUM SULFATE 40 GM/1000ML IV SOLN
INTRAVENOUS | Status: AC
  Filled 2020-07-25: qty 1000

## 2020-07-25 MED ORDER — ACETAMINOPHEN 160 MG/5ML PO SOLN
325.0000 mg | Freq: Once | ORAL | Status: DC | PRN
Start: 2020-07-25 — End: 2020-07-26

## 2020-07-25 MED ORDER — METOCLOPRAMIDE HCL 5 MG/ML IJ SOLN
INTRAMUSCULAR | Status: AC
  Filled 2020-07-25: qty 2

## 2020-07-25 MED ORDER — ACETAMINOPHEN 325 MG PO TABS: 325.0000 mg | ORAL_TABLET | Freq: Once | ORAL | Status: DC | PRN

## 2020-07-25 MED ORDER — CEFAZOLIN SODIUM-DEXTROSE 2-4 GM/100ML-% IV SOLN
2.0000 g | INTRAVENOUS | Status: AC
  Administered 2020-07-25: 2 g via INTRAVENOUS
  Filled 2020-07-25: qty 100

## 2020-07-25 MED ORDER — SODIUM CHLORIDE 0.9 % IR SOLN
Status: DC | PRN
  Administered 2020-07-25: 1

## 2020-07-25 MED ORDER — POVIDONE-IODINE 10 % EX SWAB
2.0000 "application " | Freq: Once | CUTANEOUS | Status: AC
  Administered 2020-07-25: 2 via TOPICAL

## 2020-07-25 MED ORDER — OXYTOCIN-SODIUM CHLORIDE 30-0.9 UT/500ML-% IV SOLN
INTRAVENOUS | Status: AC
  Filled 2020-07-25: qty 500

## 2020-07-25 MED ORDER — PHENYLEPHRINE 40 MCG/ML (10ML) SYRINGE FOR IV PUSH (FOR BLOOD PRESSURE SUPPORT)
PREFILLED_SYRINGE | INTRAVENOUS | Status: AC
  Filled 2020-07-25: qty 10

## 2020-07-25 MED ORDER — SCOPOLAMINE 1 MG/3DAYS TD PT72
MEDICATED_PATCH | TRANSDERMAL | Status: AC
  Filled 2020-07-25: qty 1

## 2020-07-25 MED ORDER — TRANEXAMIC ACID-NACL 1000-0.7 MG/100ML-% IV SOLN
INTRAVENOUS | Status: AC
  Filled 2020-07-25: qty 100

## 2020-07-25 MED ORDER — ACETAMINOPHEN 10 MG/ML IV SOLN
1000.0000 mg | Freq: Once | INTRAVENOUS | Status: DC | PRN
  Administered 2020-07-25: 1000 mg via INTRAVENOUS

## 2020-07-25 MED ORDER — MORPHINE SULFATE (PF) 0.5 MG/ML IJ SOLN
INTRAMUSCULAR | Status: AC
  Filled 2020-07-25: qty 10

## 2020-07-25 MED ORDER — PHENYLEPHRINE HCL (PRESSORS) 10 MG/ML IV SOLN
INTRAVENOUS | Status: DC | PRN
  Administered 2020-07-25 (×3): 80 ug via INTRAVENOUS

## 2020-07-25 MED ORDER — FENTANYL CITRATE (PF) 100 MCG/2ML IJ SOLN
INTRAMUSCULAR | Status: DC | PRN
  Administered 2020-07-25: 15 ug via INTRATHECAL

## 2020-07-25 MED ORDER — SODIUM CHLORIDE 0.9 % IV SOLN: INTRAVENOUS | Status: DC | PRN

## 2020-07-25 MED ORDER — ONDANSETRON HCL 4 MG/2ML IJ SOLN
INTRAMUSCULAR | Status: DC | PRN
  Administered 2020-07-25: 4 mg via INTRAVENOUS

## 2020-07-25 MED ORDER — SOD CITRATE-CITRIC ACID 500-334 MG/5ML PO SOLN
ORAL | Status: AC
  Administered 2020-07-25: 30 mL
  Filled 2020-07-25: qty 15

## 2020-07-25 MED ORDER — LABETALOL HCL 100 MG PO TABS
100.0000 mg | ORAL_TABLET | Freq: Two times a day (BID) | ORAL | Status: DC
  Administered 2020-07-25: 100 mg via ORAL
  Filled 2020-07-25: qty 1

## 2020-07-25 MED ORDER — BUPIVACAINE HCL (PF) 0.5 % IJ SOLN
INTRAMUSCULAR | Status: DC | PRN
  Administered 2020-07-25: 30 mL

## 2020-07-25 MED ORDER — MORPHINE SULFATE (PF) 0.5 MG/ML IJ SOLN
INTRAMUSCULAR | Status: DC | PRN
  Administered 2020-07-25: .15 mg via INTRATHECAL

## 2020-07-25 MED ORDER — OXYTOCIN-SODIUM CHLORIDE 30-0.9 UT/500ML-% IV SOLN
INTRAVENOUS | Status: DC | PRN
  Administered 2020-07-25: 30 [IU] via INTRAVENOUS

## 2020-07-25 MED ORDER — ACETAMINOPHEN 10 MG/ML IV SOLN
INTRAVENOUS | Status: AC
  Filled 2020-07-25: qty 100

## 2020-07-25 MED ORDER — BUPIVACAINE IN DEXTROSE 0.75-8.25 % IT SOLN
INTRATHECAL | Status: DC | PRN
  Administered 2020-07-25: 1.6 mL via INTRATHECAL

## 2020-07-25 MED ORDER — METOCLOPRAMIDE HCL 5 MG/ML IJ SOLN
INTRAMUSCULAR | Status: DC | PRN
  Administered 2020-07-25: 10 mg via INTRAVENOUS

## 2020-07-25 MED ORDER — DIPHENHYDRAMINE HCL 50 MG/ML IJ SOLN
INTRAMUSCULAR | Status: DC | PRN
  Administered 2020-07-25: 25 mg via INTRAVENOUS

## 2020-07-25 MED ORDER — FENTANYL CITRATE (PF) 100 MCG/2ML IJ SOLN
INTRAMUSCULAR | Status: DC | PRN
  Administered 2020-07-25: 85 ug via INTRAVENOUS

## 2020-07-25 MED ORDER — STERILE WATER FOR IRRIGATION IR SOLN
Status: DC | PRN
  Administered 2020-07-25: 1000 mL

## 2020-07-25 MED ORDER — FENTANYL CITRATE (PF) 100 MCG/2ML IJ SOLN
25.0000 ug | INTRAMUSCULAR | Status: DC | PRN
Start: 2020-07-25 — End: 2020-07-26

## 2020-07-25 SURGICAL SUPPLY — 32 items
BARRIER ADHS 3X4 INTERCEED (GAUZE/BANDAGES/DRESSINGS) IMPLANT
CHLORAPREP W/TINT 26ML (MISCELLANEOUS) ×2 IMPLANT
CLAMP CORD UMBIL (MISCELLANEOUS) IMPLANT
CLOTH BEACON ORANGE TIMEOUT ST (SAFETY) ×2 IMPLANT
DRSG OPSITE POSTOP 4X10 (GAUZE/BANDAGES/DRESSINGS) ×2 IMPLANT
ELECT REM PT RETURN 9FT ADLT (ELECTROSURGICAL) ×2
ELECTRODE REM PT RTRN 9FT ADLT (ELECTROSURGICAL) ×1 IMPLANT
EXTRACTOR VACUUM KIWI (MISCELLANEOUS) IMPLANT
GAUZE SPONGE 4X4 12PLY STRL LF (GAUZE/BANDAGES/DRESSINGS) ×4 IMPLANT
GLOVE BIO SURGEON STRL SZ 6.5 (GLOVE) ×2 IMPLANT
GLOVE BIOGEL PI IND STRL 7.0 (GLOVE) ×2 IMPLANT
GLOVE BIOGEL PI INDICATOR 7.0 (GLOVE) ×2
GOWN STRL REUS W/TWL LRG LVL3 (GOWN DISPOSABLE) ×4 IMPLANT
KIT ABG SYR 3ML LUER SLIP (SYRINGE) IMPLANT
NEEDLE HYPO 22GX1.5 SAFETY (NEEDLE) IMPLANT
NEEDLE HYPO 25X5/8 SAFETYGLIDE (NEEDLE) IMPLANT
NS IRRIG 1000ML POUR BTL (IV SOLUTION) ×2 IMPLANT
PACK C SECTION WH (CUSTOM PROCEDURE TRAY) ×2 IMPLANT
PAD ABD 7.5X8 STRL (GAUZE/BANDAGES/DRESSINGS) ×2 IMPLANT
PAD OB MATERNITY 4.3X12.25 (PERSONAL CARE ITEMS) ×2 IMPLANT
PENCIL SMOKE EVAC W/HOLSTER (ELECTROSURGICAL) ×2 IMPLANT
RETRACTOR WND ALEXIS 25 LRG (MISCELLANEOUS) IMPLANT
RTRCTR WOUND ALEXIS 25CM LRG (MISCELLANEOUS)
SUT PLAIN 2 0 XLH (SUTURE) ×2 IMPLANT
SUT VIC AB 0 CT1 36 (SUTURE) ×12 IMPLANT
SUT VIC AB 2-0 CT1 27 (SUTURE) ×1
SUT VIC AB 2-0 CT1 TAPERPNT 27 (SUTURE) ×1 IMPLANT
SUT VIC AB 4-0 PS2 27 (SUTURE) ×2 IMPLANT
SYR CONTROL 10ML LL (SYRINGE) IMPLANT
TOWEL OR 17X24 6PK STRL BLUE (TOWEL DISPOSABLE) ×2 IMPLANT
TRAY FOLEY W/BAG SLVR 14FR LF (SET/KITS/TRAYS/PACK) IMPLANT
WATER STERILE IRR 1000ML POUR (IV SOLUTION) ×2 IMPLANT

## 2020-07-25 NOTE — Op Note (Signed)
Dorothy Meadows PROCEDURE DATE: 07/25/2020  PREOPERATIVE DIAGNOSES: Intrauterine pregnancy at [redacted]w[redacted]d weeks gestation; Vaginal bleeding, severe preeclampsia, IUGR, Fetal malpresentation (breech)  POSTOPERATIVE DIAGNOSES: The same, Placental Abruption, Placenta adherens  PROCEDURE: Primary Low Transverse Cesarean Section  SURGEON:  Dr. Scheryl Darter, MD  ASSISTANT:  Dr. Lynnda Shields, MD (Fellow)             Dr. Marcheta Grammes, MD (PGY2)  ANESTHESIOLOGY TEAM: Anesthesiologist: Shelton Silvas, MD CRNA: Armanda Heritage, CRNA  INDICATIONS: Dorothy Meadows is a 35 y.o. [phone] at [redacted]w[redacted]d here for cesarean section secondary to the indications listed under preoperative diagnoses; please see preoperative note for further details.  The risks of surgery were discussed with the patient including but were not limited to: bleeding which may require transfusion or reoperation; infection which may require antibiotics; injury to bowel, bladder, ureters or other surrounding organs; injury to the fetus; need for additional procedures including hysterectomy in the event of a life-threatening hemorrhage; formation of adhesions; placental abnormalities wth subsequent pregnancies; incisional problems; thromboembolic phenomenon and other postoperative/anesthesia complications.  The patient concurred with the proposed plan, giving informed written consent for the procedure.    FINDINGS:  Viable female infant in cephalic presentation.  Apgars 8 and 9.  Clear amniotic fluid. Placenta with three vessel cord; manually extracted in several fragments given placenta adherens.  Normal uterus, fallopian tubes and ovaries bilaterally.  ANESTHESIA: Spinal INTRAVENOUS FLUIDS: 1200 ml   ESTIMATED BLOOD LOSS: 455 ml URINE OUTPUT:  50 ml SPECIMENS: Placenta sent to pathology COMPLICATIONS: None immediate  PROCEDURE IN DETAIL:  The patient preoperatively received intravenous antibiotics (ancef) and had sequential compression  devices applied to her lower extremities.  She was then taken to the operating room where spinal anesthesia was administered and was found to be adequate. She was then placed in a dorsal supine position with a leftward tilt, and prepped and draped in a sterile manner.  A foley catheter was placed into her bladder and attached to constant gravity.  After an adequate timeout was performed, a Pfannenstiel skin incision was made with scalpel and carried through to the underlying layer of fascia. The fascia was incised in the midline, and this incision was extended bilaterally using the Mayo scissors.  Kocher clamps were applied to the superior aspect of the fascial incision and the underlying rectus muscles were dissected off bluntly and sharply.  A similar process was carried out on the inferior aspect of the fascial incision. The rectus muscles were separated in the midline and the peritoneum was entered bluntly. The Alexis self-retaining retractor was introduced into the abdominal cavity.  Attention was turned to the lower uterine segment where a bladder flap was created sharply and bluntly. A low transverse hysterotomy was then made with a scalpel and extended bilaterally bluntly. Of note, multiple clots visualized in the uterine cavity immediately upon entry, consistent with placental abruption. The infant was successfully delivered with use of breech maneuvers, the cord was clamped and cut after one minute, and the infant was handed over to the awaiting neonatology team. Arterial cord gas was collected (pH 7.30). Cord blood was also collected. Uterine massage was then administered. Placenta with 3-vessel cord was manually extracted in several fragments secondary to placenta adherens. The uterus was then thoroughly cleared of clots and debris.  The hysterotomy was closed with 0 Vicryl in a running locked fashion, and a vertical imbricating layer was also placed with 0 Vicryl.  A single figure-of-eight 0 Vicryl  serosal stitch was placed to help with hemostasis.  The pelvis was irrigated and cleared of all clot and debris. Hemostasis was confirmed on all surfaces.  The retractor was removed.  The peritoneum was closed with a 0 Vicryl running stitch. The fascia was then closed using 0 Vicryl in a running fashion.  The subcutaneous layer was irrigated, reapproximated with 2-0 plain gut interrupted stitches, and the skin was closed with a 4-0 Vicryl subcuticular stitch. The patient tolerated the procedure well. Sponge, instrument and needle counts were correct x 3.  She was taken to the recovery room in stable condition.   Sheila Oats, MD OB Fellow, Faculty Practice 07/25/2020 11:56 PM

## 2020-07-25 NOTE — Transfer of Care (Signed)
Immediate Anesthesia Transfer of Care Note  Patient: Monita GIFT RUECKERT  Procedure(s) Performed: CESAREAN SECTION (N/A )  Patient Location: PACU  Anesthesia Type:Spinal  Level of Consciousness: awake, alert  and oriented  Airway & Oxygen Therapy: Patient Spontanous Breathing  Post-op Assessment: Report given to RN and Post -op Vital signs reviewed and stable  Post vital signs: Reviewed and stable  Last Vitals:  Vitals Value Taken Time  BP    Temp    Pulse 62 07/25/20 2337  Resp 17 07/25/20 2337  SpO2 100 % 07/25/20 2337  Vitals shown include unvalidated device data.  Last Pain:  Vitals:   07/25/20 1946  TempSrc:   PainSc: 7       Patients Stated Pain Goal: 3 (07/25/20 1339)  Complications: No complications documented.

## 2020-07-25 NOTE — Plan of Care (Signed)
  Problem: Education: Goal: Knowledge of General Education information will improve Description: Including pain rating scale, medication(s)/side effects and non-pharmacologic comfort measures Outcome: Completed/Met   Problem: Activity: Goal: Risk for activity intolerance will decrease Outcome: Completed/Met   Problem: Nutrition: Goal: Adequate nutrition will be maintained Outcome: Completed/Met   Problem: Coping: Goal: Level of anxiety will decrease Outcome: Completed/Met   Problem: Elimination: Goal: Will not experience complications related to urinary retention Outcome: Completed/Met   Problem: Education: Goal: Knowledge of disease or condition will improve Outcome: Completed/Met   

## 2020-07-25 NOTE — Progress Notes (Signed)
Patient ID: Dorothy Meadows, female   DOB: [birth year], 35 y.o.   MRN: [id number] FACULTY PRACTICE ANTEPARTUM(COMPREHENSIVE) NOTE  Dorothy Meadows is a 35 y.o. [id number] at [redacted]w[redacted]d by early ultrasound who is admitted for preeclampsia.   Fetal presentation is breech. Length of Stay:  0  Days  Subjective: Pink discharge, small clot passed last night Patient reports the fetal movement as active. Patient reports uterine contraction  activity as irregular Patient reports  vaginal bleeding as scant staining. Patient describes fluid per vagina as None.  Vitals:  Blood pressure (!) 151/86, pulse 82, temperature 98.3 F (36.8 C), temperature source Oral, resp. rate 18, height 5\' 9"  (1.753 m), weight 106.4 kg, last menstrual period 11/09/2019, SpO2 100 %. Physical Examination:  General appearance - alert, well appearing, and in no distress Heart - normal rate and regular rhythm Abdomen - soft, nontender, nondistended Fundal Height:  size equals dates Cervical Exam: Not evaluated. Extremities: extremities normal, atraumatic, no cyanosis or edema and Homans sign is negative, no sign of DVT with DTRs 2+ bilaterally Membranes:intact  Fetal Monitoring:   Mode External  [applied] filed at 07/25/2020 1343  Baseline Rate (A) 140 bpm filed at 07/25/2020 1042  Variability 6-25 BPM filed at 07/25/2020 1042  Accelerations 10 x 10 filed at 07/25/2020 1042  Decelerations None filed at 07/25/2020 1042     Labs:  No results found for this or any previous visit (from the past 24 hour(s)).   Medications:  Scheduled . docusate sodium  100 mg Oral Daily  . NIFEdipine  60 mg Oral BID  . pantoprazole  40 mg Oral Daily  . potassium chloride  20 mEq Oral BID  . prenatal multivitamin  1 tablet Oral Q1200  . sodium chloride flush  3 mL Intravenous Q12H   I have reviewed the patient's current medications.  ASSESSMENT: Patient Active Problem List   Diagnosis Date Noted  . Polyhydramnios in third trimester  07/22/2020  . Severe IUGR (<1%) in third trimester 07/22/2020  . Breech presentation of fetus 07/22/2020  . Preterm uterine contractions, antepartum, third trimester 07/22/2020  . [redacted] weeks gestation of pregnancy 07/22/2020  . Preeclampsia, severe, third trimester 07/20/2020  . Limited prenatal care in second trimester 06/22/2020  . Supervision of high risk pregnancy, antepartum 03/05/2020  . Advanced maternal age in multigravida, second trimester 03/05/2020  . SVT (supraventricular tachycardia) (HCC) 02/18/2014  . Cigarette smoker one half pack a day or less 12/19/2012    PLAN: BP has needed treatment today, will increase nifedipine. Watch for recurrent vaginal bleeding  12/21/2012 07/25/2020,2:27 PM

## 2020-07-25 NOTE — Anesthesia Preprocedure Evaluation (Addendum)
Anesthesia Evaluation  Patient identified by MRN, date of birth, ID band Patient awake    Reviewed: Allergy & Precautions, NPO status , Patient's Chart, lab work & pertinent test results  Airway Mallampati: II  TM Distance: >3 FB Neck ROM: Full    Dental  (+) Teeth Intact, Dental Advisory Given   Pulmonary Current Smoker,    breath sounds clear to auscultation       Cardiovascular hypertension, + dysrhythmias Supra Ventricular Tachycardia  Rhythm:Regular Rate:Normal     Neuro/Psych    GI/Hepatic GERD  Medicated,  Endo/Other  diabetes, Gestational  Renal/GU      Musculoskeletal   Abdominal Normal abdominal exam  (+)   Peds  Hematology   Anesthesia Other Findings - HLD  Reproductive/Obstetrics                            Anesthesia Physical Anesthesia Plan  ASA: II  Anesthesia Plan: Spinal   Post-op Pain Management:    Induction:   PONV Risk Score and Plan: 2 and Ondansetron  Airway Management Planned: Natural Airway and Nasal Cannula  Additional Equipment: None  Intra-op Plan:   Post-operative Plan:   Informed Consent: I have reviewed the patients History and Physical, chart, labs and discussed the procedure including the risks, benefits and alternatives for the proposed anesthesia with the patient or authorized representative who has indicated his/her understanding and acceptance.       Plan Discussed with: CRNA  Anesthesia Plan Comments: (Lab Results      Component                Value               Date                      WBC                      18.0 (H)            07/24/2020                HGB                      13.1                07/24/2020                HCT                      38.6                07/24/2020                MCV                      89.8                07/24/2020                PLT                      201                 07/24/2020           )        Anesthesia Quick Evaluation

## 2020-07-25 NOTE — Progress Notes (Signed)
Vaginal bleeding continues and patient notes increasing contractions Blood pressure (!) 152/89, pulse 77, temperature 98 F (36.7 C), temperature source Oral, resp. rate 18, height 5\' 9"  (1.753 m), weight 106.4 kg, last menstrual period 11/09/2019, SpO2 100 %.  Fetal Heart Rate A   Mode External filed at 07/25/2020 1840  Baseline Rate (A) 135 bpm filed at 07/25/2020 1840  Variability 6-25 BPM filed at 07/25/2020 1840  Accelerations 15 x 15 filed at 07/25/2020 1840  Decelerations None filed at 07/25/2020 1840  Cervix 20%/1/ballotable  Breech  Vaginal bleeding noted Due to possible abruptio with bleeding and IUGR, breech at 33.3 weeks will proceed with cesarean section. The risks of surgery were discussed with the patient including but were not limited to: bleeding which may require transfusion or reoperation; infection which may require antibiotics; injury to bowel, bladder, ureters or other surrounding organs; injury to the fetus; need for additional procedures including hysterectomy in the event of a life-threatening hemorrhage; formation of adhesions; placental abnormalities with subsequent pregnancies; incisional problems; thromboembolic phenomenon and other postoperative/anesthesia complications.  The patient concurred with the proposed plan, giving informed written consent for the procedure.   Patient has been NPO since 1300 she will remain NPO for procedure. Anesthesia and OR aware. Preoperative prophylactic antibiotics and SCDs ordered on call to the OR.  To OR when ready. 07/27/2020, MD 07/25/2020 9:11 PM

## 2020-07-25 NOTE — Progress Notes (Signed)
RN called Dr. Debroah Loop at 1142 r/t pt's. blood pressure elevation. Dr. Debroah Loop advised proceeding with giving the 40 of Labetalol protocol instead of restarting with 20.

## 2020-07-25 NOTE — Anesthesia Procedure Notes (Signed)
Spinal  Start time: 07/25/2020 10:05 PM End time: 07/25/2020 10:07 PM Reason for block: surgical anesthesia Staffing Performed: anesthesiologist  Anesthesiologist: Shelton Silvas, MD Preanesthetic Checklist Completed: patient identified, IV checked, site marked, risks and benefits discussed, surgical consent, monitors and equipment checked, pre-op evaluation and timeout performed Spinal Block Patient position: sitting Prep: DuraPrep and site prepped and draped Location: L3-4 Injection technique: single-shot Needle Needle type: Pencan  Needle gauge: 24 G Needle length: 10 cm Needle insertion depth: 10 cm Additional Notes Patient tolerated well. No immediate complications.

## 2020-07-26 ENCOUNTER — Encounter (HOSPITAL_COMMUNITY): Payer: Self-pay | Admitting: Obstetrics & Gynecology

## 2020-07-26 DIAGNOSIS — O4590 Premature separation of placenta, unspecified, unspecified trimester: Secondary | ICD-10-CM

## 2020-07-26 LAB — CBC
HCT: 36.8 % (ref 36.0–46.0)
HCT: 37 % (ref 36.0–46.0)
Hemoglobin: 12.3 g/dL (ref 12.0–15.0)
Hemoglobin: 12.4 g/dL (ref 12.0–15.0)
MCH: 30 pg (ref 26.0–34.0)
MCH: 30.3 pg (ref 26.0–34.0)
MCHC: 33.2 g/dL (ref 30.0–36.0)
MCHC: 33.7 g/dL (ref 30.0–36.0)
MCV: 90 fL (ref 80.0–100.0)
MCV: 90.2 fL (ref 80.0–100.0)
Platelets: 192 10*3/uL (ref 150–400)
Platelets: 192 10*3/uL (ref 150–400)
RBC: 4.09 MIL/uL (ref 3.87–5.11)
RBC: 4.1 MIL/uL (ref 3.87–5.11)
RDW: 13.9 % (ref 11.5–15.5)
RDW: 14 % (ref 11.5–15.5)
WBC: 17.7 10*3/uL — ABNORMAL HIGH (ref 4.0–10.5)
WBC: 25.9 10*3/uL — ABNORMAL HIGH (ref 4.0–10.5)
nRBC: 0 % (ref 0.0–0.2)
nRBC: 0 % (ref 0.0–0.2)

## 2020-07-26 MED ORDER — KETOROLAC TROMETHAMINE 30 MG/ML IJ SOLN: 30.0000 mg | Freq: Four times a day (QID) | INTRAMUSCULAR | Status: DC | PRN

## 2020-07-26 MED ORDER — WITCH HAZEL-GLYCERIN EX PADS: 1.0000 "application " | MEDICATED_PAD | CUTANEOUS | Status: DC | PRN

## 2020-07-26 MED ORDER — HYDRALAZINE HCL 20 MG/ML IJ SOLN: 10.0000 mg | INTRAMUSCULAR | Status: DC | PRN

## 2020-07-26 MED ORDER — FUROSEMIDE 10 MG/ML IJ SOLN
INTRAMUSCULAR | Status: AC
  Filled 2020-07-26: qty 2

## 2020-07-26 MED ORDER — CEFAZOLIN SODIUM-DEXTROSE 2-4 GM/100ML-% IV SOLN
INTRAVENOUS | Status: AC
  Filled 2020-07-26: qty 100

## 2020-07-26 MED ORDER — MAGNESIUM SULFATE 40 GM/1000ML IV SOLN
2.0000 g/h | INTRAVENOUS | Status: AC
  Administered 2020-07-26 (×2): 2 g/h via INTRAVENOUS
  Filled 2020-07-26: qty 1000

## 2020-07-26 MED ORDER — IBUPROFEN 800 MG PO TABS
800.0000 mg | ORAL_TABLET | Freq: Four times a day (QID) | ORAL | Status: DC
  Administered 2020-07-27 – 2020-07-28 (×6): 800 mg via ORAL
  Filled 2020-07-26 (×6): qty 1

## 2020-07-26 MED ORDER — DIPHENHYDRAMINE HCL 25 MG PO CAPS: 25.0000 mg | ORAL_CAPSULE | Freq: Four times a day (QID) | ORAL | Status: DC | PRN

## 2020-07-26 MED ORDER — OXYTOCIN-SODIUM CHLORIDE 30-0.9 UT/500ML-% IV SOLN: 2.5000 [IU]/h | INTRAVENOUS | Status: AC

## 2020-07-26 MED ORDER — ENOXAPARIN SODIUM 60 MG/0.6ML ~~LOC~~ SOLN
0.5000 mg/kg | SUBCUTANEOUS | Status: DC
  Administered 2020-07-26 – 2020-07-27 (×2): 52.5 mg via SUBCUTANEOUS
  Filled 2020-07-26 (×2): qty 0.6

## 2020-07-26 MED ORDER — DEXTROSE 5 % IV SOLN
1.0000 ug/kg/h | INTRAVENOUS | Status: DC | PRN
  Filled 2020-07-26: qty 5

## 2020-07-26 MED ORDER — NALBUPHINE HCL 10 MG/ML IJ SOLN: 5.0000 mg | INTRAMUSCULAR | Status: DC | PRN

## 2020-07-26 MED ORDER — PRENATAL MULTIVITAMIN CH
1.0000 | ORAL_TABLET | Freq: Every day | ORAL | Status: DC
  Administered 2020-07-26 – 2020-07-27 (×2): 1 via ORAL
  Filled 2020-07-26 (×2): qty 1

## 2020-07-26 MED ORDER — SODIUM CHLORIDE 0.9% FLUSH: 3.0000 mL | INTRAVENOUS | Status: DC | PRN

## 2020-07-26 MED ORDER — SIMETHICONE 80 MG PO CHEW
80.0000 mg | CHEWABLE_TABLET | Freq: Three times a day (TID) | ORAL | Status: DC
  Administered 2020-07-26 – 2020-07-28 (×6): 80 mg via ORAL
  Filled 2020-07-26 (×7): qty 1

## 2020-07-26 MED ORDER — DIPHENHYDRAMINE HCL 25 MG PO CAPS: 25.0000 mg | ORAL_CAPSULE | ORAL | Status: DC | PRN

## 2020-07-26 MED ORDER — LACTATED RINGERS IV SOLN: INTRAVENOUS | Status: DC

## 2020-07-26 MED ORDER — ONDANSETRON HCL 4 MG/2ML IJ SOLN
4.0000 mg | Freq: Three times a day (TID) | INTRAMUSCULAR | Status: DC | PRN
Start: 2020-07-26 — End: 2020-07-28

## 2020-07-26 MED ORDER — LABETALOL HCL 5 MG/ML IV SOLN: 80.0000 mg | INTRAVENOUS | Status: DC | PRN

## 2020-07-26 MED ORDER — ACETAMINOPHEN 500 MG PO TABS
1000.0000 mg | ORAL_TABLET | Freq: Four times a day (QID) | ORAL | Status: DC
  Administered 2020-07-26 – 2020-07-28 (×8): 1000 mg via ORAL
  Filled 2020-07-26 (×9): qty 2

## 2020-07-26 MED ORDER — OXYCODONE HCL 5 MG PO TABS
5.0000 mg | ORAL_TABLET | ORAL | Status: DC | PRN
Start: 2020-07-26 — End: 2020-07-28
  Administered 2020-07-27 – 2020-07-28 (×4): 5 mg via ORAL
  Filled 2020-07-26 (×5): qty 1

## 2020-07-26 MED ORDER — SENNOSIDES-DOCUSATE SODIUM 8.6-50 MG PO TABS
2.0000 | ORAL_TABLET | Freq: Every day | ORAL | Status: DC
  Administered 2020-07-26 – 2020-07-27 (×2): 2 via ORAL
  Filled 2020-07-26 (×3): qty 2

## 2020-07-26 MED ORDER — KETOROLAC TROMETHAMINE 30 MG/ML IJ SOLN
30.0000 mg | Freq: Four times a day (QID) | INTRAMUSCULAR | Status: AC
  Administered 2020-07-26 (×3): 30 mg via INTRAVENOUS
  Filled 2020-07-26 (×3): qty 1

## 2020-07-26 MED ORDER — MAGNESIUM SULFATE BOLUS VIA INFUSION
4.0000 g | Freq: Once | INTRAVENOUS | Status: AC
  Administered 2020-07-26: 4 g via INTRAVENOUS
  Filled 2020-07-26: qty 1000

## 2020-07-26 MED ORDER — MENTHOL 3 MG MT LOZG: 1.0000 | LOZENGE | OROMUCOSAL | Status: DC | PRN

## 2020-07-26 MED ORDER — LABETALOL HCL 5 MG/ML IV SOLN: 20.0000 mg | INTRAVENOUS | Status: DC | PRN

## 2020-07-26 MED ORDER — NALOXONE HCL 0.4 MG/ML IJ SOLN
0.4000 mg | INTRAMUSCULAR | Status: DC | PRN
Start: 2020-07-26 — End: 2020-07-28

## 2020-07-26 MED ORDER — LABETALOL HCL 100 MG PO TABS
100.0000 mg | ORAL_TABLET | Freq: Two times a day (BID) | ORAL | Status: DC
  Administered 2020-07-26: 100 mg via ORAL
  Filled 2020-07-26 (×2): qty 1

## 2020-07-26 MED ORDER — DIBUCAINE (PERIANAL) 1 % EX OINT
1.0000 | TOPICAL_OINTMENT | CUTANEOUS | Status: DC | PRN
Start: 2020-07-25 — End: 2020-07-28

## 2020-07-26 MED ORDER — COCONUT OIL OIL: 1.0000 "application " | TOPICAL_OIL | Status: DC | PRN

## 2020-07-26 MED ORDER — SCOPOLAMINE 1 MG/3DAYS TD PT72: 1.0000 | MEDICATED_PATCH | Freq: Once | TRANSDERMAL | Status: DC

## 2020-07-26 MED ORDER — NALBUPHINE HCL 10 MG/ML IJ SOLN: 5.0000 mg | Freq: Once | INTRAMUSCULAR | Status: DC | PRN

## 2020-07-26 MED ORDER — SIMETHICONE 80 MG PO CHEW: 80.0000 mg | CHEWABLE_TABLET | ORAL | Status: DC | PRN

## 2020-07-26 MED ORDER — TETANUS-DIPHTH-ACELL PERTUSSIS 5-2.5-18.5 LF-MCG/0.5 IM SUSY
0.5000 mL | PREFILLED_SYRINGE | Freq: Once | INTRAMUSCULAR | Status: AC
  Administered 2020-07-28: 0.5 mL via INTRAMUSCULAR
  Filled 2020-07-26: qty 0.5

## 2020-07-26 MED ORDER — DIPHENHYDRAMINE HCL 50 MG/ML IJ SOLN: 12.5000 mg | INTRAMUSCULAR | Status: DC | PRN

## 2020-07-26 MED ORDER — LABETALOL HCL 5 MG/ML IV SOLN: 40.0000 mg | INTRAVENOUS | Status: DC | PRN

## 2020-07-26 NOTE — Progress Notes (Signed)
Subjective:no complaint Postpartum Day 1: Cesarean Delivery Patient reports incisional pain and tolerating PO.    Objective: Vital signs in last 24 hours: Temp:  [97.6 F (36.4 C)-98.7 F (37.1 C)] 97.7 F (36.5 C) (03/28 0506) Pulse Rate:  [61-87] 61 (03/28 0700) Resp:  [17-24] 20 (03/28 0600) BP: (136-170)/(81-111) 137/89 (03/28 0700) SpO2:  [92 %-100 %] 100 % (03/28 0700)  Physical Exam:  General: alert, cooperative and no distress Lochia: appropriate Uterine Fundus: firm Incision: no significant drainage DVT Evaluation: No evidence of DVT seen on physical exam.  Recent Labs    07/25/20 2348 07/26/20 0446  HGB 12.4 12.3  HCT 36.8 37.0    Assessment/Plan: Status post Cesarean section. Doing well postoperatively.  D/c magnesium after 24 hr.  Scheryl Darter 07/26/2020, 7:21 AM

## 2020-07-26 NOTE — Lactation Note (Signed)
This note was copied from a baby's chart. Lactation Consultation Note  Patient Name: Dorothy Meadows [id number] Date: 07/26/2020 Reason for consult: Follow-up assessment;Preterm <34wks;NICU baby Age:36 hours   [facility] Student entered the room to P4 mom who was getting comfortable in bed. [facility] Student asked about pumping and what her plans were for feeding this baby. Mom shared that she breastfed/pumped for her 3 previous children for approximately 1-2 months each before transitioning to formula; understood the benefits of breastfeeding. She wanted to continue that plan moving forward.  Mom stated she wasn't pumping consistently just yet because of exhaustion and feeling bad; noted that mom is on magnesium and has made it difficult to pump. Education was provided why pumping is important for supply moving forward and the goal of 8x/24 hrs is what she should be working towards.  Mom was made aware of donor milk and plans to use it with her own breast milk for baby while in the NICU; [facility] Student discussed the benefits of donor milk and mentioned that limited supply would be given upon discharge. From there, mom discussed her feeding plan of choice, listed below.  [facility] student talked about STS and sending a WIC referral to qualify for a hospital grade pump. Mom shared she would be interested has completed and signed the proper paperwork. LC will fax off information.   Feeding Plan -Rest and recovery -STS with baby as much as able -Pumping every 2-3 hrs 8x/24 hrs once magnesium is discontinued  Maternal Data    Feeding Mother's Current Feeding Choice: Breast Milk and Donor Milk  Lactation Tools Discussed/Used    Interventions Interventions: Breast feeding basics reviewed;Education  Discharge    Consult Status      Dorothy Meadows 07/26/2020, 7:28 PM

## 2020-07-26 NOTE — Anesthesia Postprocedure Evaluation (Signed)
Anesthesia Post Note  Patient: Dorothy Meadows  Procedure(s) Performed: CESAREAN SECTION (N/A )     Patient location during evaluation: PACU Anesthesia Type: Spinal Level of consciousness: oriented and awake and alert Pain management: pain level controlled Vital Signs Assessment: post-procedure vital signs reviewed and stable Respiratory status: spontaneous breathing, respiratory function stable and patient connected to nasal cannula oxygen Cardiovascular status: blood pressure returned to baseline and stable Postop Assessment: no headache, no backache and no apparent nausea or vomiting Anesthetic complications: no Comments: Lab Results      Component                Value               Date                      WBC                      17.7 (H)            07/25/2020                HGB                      12.4                07/25/2020                HCT                      36.8                07/25/2020                MCV                      90.0                07/25/2020                PLT                      192                 07/25/2020              No complications documented.  Last Vitals:  Vitals:   07/26/20 0015 07/26/20 0030  BP: 136/82 (!) 148/96  Pulse: 65 73  Resp: 18 18  Temp:    SpO2: 92% 99%    Last Pain:  Vitals:   07/26/20 0015  TempSrc:   PainSc: 0-No pain   Pain Goal: Patients Stated Pain Goal: 2 (07/26/20 0015)  LLE Motor Response: Purposeful movement (07/26/20 0015) LLE Sensation: Full sensation (07/26/20 0015) RLE Motor Response: Purposeful movement (07/26/20 0015) RLE Sensation: Full sensation (07/26/20 0015) L Sensory Level: L5-Outer lower leg, top of foot, great toe (07/26/20 0015) R Sensory Level: L5-Outer lower leg, top of foot, great toe (07/26/20 0015) Epidural/Spinal Function Cutaneous sensation: Normal sensation (07/26/20 0015), Patient able to flex knees: Yes (07/26/20 0015), Patient able to lift hips off bed: Yes (07/26/20  0015), Back pain beyond tenderness at insertion site: No (07/26/20 0015), Progressively worsening motor and/or sensory loss: No (07/26/20 0015), Bowel and/or bladder incontinence post epidural: No (07/26/20 0015)  Shelton Silvas

## 2020-07-26 NOTE — Lactation Note (Signed)
This note was copied from a baby's chart. Lactation Consultation Note  Patient Name: Girl [place] ACZYS'A Date: 07/26/2020   Age:36 hours  Mom is a P4 who nursed her previous children (from 1-3 months). NICU booklet provided.   I noted that Mom was using size 27 flanges; her nipple diameter suggests that size 24 flanges would be a better fit. I recommended that Mom use those. Mom has pumped once since birth & obtained 2/3 of an oz.   Mom does not have WIC, but knows she is eligible.   Mom noted to be on labetalol 100mg  bid & nifedipine 60 mg bid.   [facility] 07/26/2020, 9:58 AM

## 2020-07-26 NOTE — Discharge Summary (Signed)
Postpartum Discharge Summary  Date of Service updated 07/28/20    Patient Name: Dorothy Meadows DOB: [birth year] MRN: [id number]  Date of admission: 07/20/2020 Delivery date:07/25/2020  Delivering provider: [place] Mode  Date of discharge: 07/28/2020  Admitting diagnosis: Severe preeclampsia [O14.10] Intrauterine pregnancy: [redacted]w[redacted]d    Secondary diagnosis:  Principal Problem:   Preeclampsia, severe, third trimester Active Problems:   Cigarette smoker one half pack a day or less   [place]   Supervision of high risk pregnancy, antepartum   Advanced maternal age in multigravida, second trimester   Limited prenatal care in second trimester   Polyhydramnios in third trimester   Severe IUGR (<1%) in third trimester   Breech presentation of fetus   Preterm uterine contractions, antepartum, third trimester   [redacted] weeks gestation of pregnancy   Placental abruption  Additional problems: as noted above   Discharge diagnosis: Cesarean delivery delivered                                          Post partum procedures:none Augmentation: N/A Complications: Placental Abruption, Placental Adherence  Hospital course: Sceduled C/S   35y.o. yo [id number] 311w2das admitted to the hospital on 07/20/2020 for vaginal bleeding and contractions. Upon arrival, pt was found to have severe Preeclampsia based on severe range blood pressures. BMZ x2 was administered and pt was started on Magnesium infusion in addition to procardia XL 3068mID for blood pressure control. Magnesium was discontinued on 07/22/20. NICU was consulted. On 07/25/20, pt went for cesarean section with the following indication: increasing vaginal bleeding & contractions. Delivery details are as follows:  Membrane Rupture Time/Date: 10:29 PM ,07/25/2020   Delivery Method:C-Section, Low Transverse  Details of operation can be found in separate operative note. Pt noted to have intraoperative findings  consistent with placental abruption, additionally there was some concern with placental adherence- see op note.  Given severe preeclampsia, pt was restarted on magnesium s/p Cesarean for 24 hours in addition to procardia XL. Patient had an uncomplicated postpartum course.  She is ambulating, tolerating a regular diet, passing flatus, and urinating well. Patient is discharged home in stable condition on  07/28/20. She was discharged home on ProcardiaXL 10m66md for blood pressure control with plans for 1 week BP check in clinic.        Newborn Data: Birth date:07/25/2020  Birth time:10:31 PM  Gender:Female  Living status:Living  Apgars:8 ,9  Weight:1410 g     Magnesium Sulfate received: Yes: Seizure prophylaxis BMZ received: Yes Rhophylac:N/A MMR:N/A T-DaP:Given postpartumoffered prior to delivery Flu:UVO:ZDGUYQIor to delivery Transfusion:No  Physical exam  Vitals:   07/27/20 1939 07/27/20 2345 07/28/20 0514 07/28/20 0835  BP: 135/80 (!) 141/89 (!) 143/85 134/71  Pulse: (!) 106 73 82 85  Resp: '18 17 18 19  ' Temp: 98.3 F (36.8 C) 97.6 F (36.4 C) 98.2 F (36.8 C) 98.1 F (36.7 C)  TempSrc: Oral Axillary Oral Oral  SpO2: 99% 100% 100% 99%  Weight:      Height:       General: alert, cooperative and no distress Lochia: appropriate Uterine Fundus: firm Incision: Dressing is clean, dry, and intact DVT Evaluation: No evidence of DVT seen on physical exam. Labs: Lab Results  Component Value Date   WBC 25.9 (H) 07/26/2020   HGB 12.3 07/26/2020   HCT 37.0 07/26/2020  MCV 90.2 07/26/2020   PLT 192 07/26/2020   CMP Latest Ref Rng & Units 07/24/2020  Glucose 70 - 99 mg/dL 85  BUN 6 - 20 mg/dL 9  Creatinine 0.44 - 1.00 mg/dL 0.69  Sodium 135 - 145 mmol/L 136  Potassium 3.5 - 5.1 mmol/L 3.4(L)  Chloride 98 - 111 mmol/L 106  CO2 22 - 32 mmol/L 23  Calcium 8.9 - 10.3 mg/dL 8.6(L)  Total Protein 6.5 - 8.1 g/dL 6.4(L)  Total Bilirubin 0.3 - 1.2 mg/dL 0.6  Alkaline Phos 38 - 126  U/L 141(H)  AST 15 - 41 U/L 38  ALT 0 - 44 U/L 18   Edinburgh Score: Edinburgh Postnatal Depression Scale Screening Tool 07/27/2020  I have been able to laugh and see the funny side of things. 0  I have looked forward with enjoyment to things. 0  I have blamed myself unnecessarily when things went wrong. 1  I have been anxious or worried for no good reason. 1  I have felt scared or panicky for no good reason. 0  Things have been getting on top of me. 1  I have been so unhappy that I have had difficulty sleeping. 0  I have felt sad or miserable. 0  I have been so unhappy that I have been crying. 0  The thought of harming myself has occurred to me. 0  Edinburgh Postnatal Depression Scale Total 3     After visit meds:  Allergies as of 07/28/2020   No Known Allergies     Medication List    STOP taking these medications   aspirin EC 81 MG tablet     TAKE these medications   docusate sodium 100 MG capsule Commonly known as: Colace Take 1 capsule (100 mg total) by mouth 2 (two) times daily as needed for mild constipation or moderate constipation.   ibuprofen 600 MG tablet Commonly known as: ADVIL Take 1 tablet (600 mg total) by mouth every 6 (six) hours as needed for mild pain or moderate pain.   NIFEdipine 60 MG 24 hr tablet Commonly known as: ADALAT CC Take 1 tablet (60 mg total) by mouth 2 (two) times daily.   omeprazole 20 MG capsule Commonly known as: PRILOSEC Take 20 mg by mouth daily.   oxyCODONE 5 MG immediate release tablet Commonly known as: Oxy IR/ROXICODONE Take 1 tablet (5 mg total) by mouth every 6 (six) hours as needed for up to 7 days for severe pain or breakthrough pain.   prenatal multivitamin Tabs tablet Take 1 tablet by mouth daily at 12 noon.        Discharge home in stable condition Infant Feeding: Breast Infant Disposition:home with mother Discharge instruction: per After Visit Summary and Postpartum booklet. Activity: Advance as tolerated.  Pelvic rest for 6 weeks.  Diet: routine diet Future Appointments: No future appointments. Follow up Visit:  [place] [facility] [place] [facility] Follow up in 1 week(s).   Specialty: Obstetrics and Gynecology Contact information: [address] [place], [place] [phone]             Message sent to [facility] by Dr. Astrid Drafts.  Please schedule this patient for a In person postpartum visit in 6 weeks with the following provider: Any provider. Additional Postpartum F/U:Incision check 1 week and BP check 1 week  High risk pregnancy complicated by: Severe IUGR, Severe Preeclampsia, Polyhydramnios, Breech Presentation, Placental Abruption, Placenta [place], Limited Prenatal Care,  Tobacco use  Delivery mode:  C-Section, Low Transverse  Anticipated Birth Control:  POPs  07/28/2020 Annalee Genta, DO

## 2020-07-27 NOTE — Progress Notes (Signed)
POSTPARTUM PROGRESS NOTE  POD #2  Subjective:  Dorothy Meadows is a 35 y.o. [id number] s/p PLTCS at [redacted]w[redacted]d. Today she notes no acute complaints. She denies any problems with ambulating, voiding or po intake. Denies nausea or vomiting. She has + flatus, +BM.  Pain is minimal.  Lochia minimal Denies fever/chills/chest pain/SOB.  no HA, no blurry vision, no RUQ pain  Objective: Blood pressure 131/69, pulse 81, temperature 98.5 F (36.9 C), temperature source Oral, resp. rate 17, height 5\' 9"  (1.753 m), weight 106.4 kg, last menstrual period 11/09/2019, SpO2 98 %, unknown if currently breastfeeding.  Physical Exam:  General: alert, cooperative and no distress Chest: no respiratory distress, CTAB Heart: RRR Abdomen: soft, nontender, +BS Uterine Fundus: firm, appropriately tender Incision: C/D/I with honeycomb DVT Evaluation: No calf swelling or tenderness Extremities: no edema Skin: warm, dry  No results found for this or any previous visit (from the past 24 hour(s)).  Assessment/Plan: Dorothy Meadows is a 35 y.o. [phone] s/p pLTCS at [redacted]w[redacted]d POD#2 complicated by: 1) Preeclampsia with severe features -BP well controlled with ProcardiaXL 60mg  bid -s/p magnesium x 24hr  2) continue with routine postop care  Contraception: POPs Feeding: baby in NICU  Dispo: Plan for discharge home tomorrow if BP remains stable   LOS: 2 days   [redacted]w[redacted]d, DO Faculty Attending, [facility] 07/27/2020, 10:26 AM

## 2020-07-27 NOTE — Lactation Note (Signed)
This note was copied from a baby's chart. Lactation Consultation Note  Patient Name: Dorothy Meadows UKGUR'K Date: 07/27/2020 Reason for consult: Follow-up assessment;Preterm <34wks Age:36 hours  Follow up visit to P4 mother of 22 old infant currently in NICU. Infant is skin to skin upon arrival. Mother states she has been pumping but unable to collect any EBM. Mother explains "it seems pointless to continue pumping". Discussed the importance of breast stimulation, and its influence to milk supply. Encouraged mother to continue pumping at least 8 times a day.   Encouraged to request [facility] for any needs, support or questions.    Maternal Data Does the patient have breastfeeding experience prior to this delivery?: Yes  Feeding Mother's Current Feeding Choice: Breast Milk and Donor Milk  Interventions Interventions: Breast feeding basics reviewed;Expressed milk;DEBP;Education;Hand pump  Consult Status Consult Status: Follow-up Date: 07/28/20 Follow-up type: In-patient    Dorothy Meadows 07/27/2020, 8:40 PM

## 2020-07-27 NOTE — Lactation Note (Signed)
This note was copied from a baby's chart. Lactation Consultation Note  Patient Name: Dorothy Meadows SHUOH'F Date: 07/27/2020   Age:36 hours   LC attempted to see Mom, but she was sleeping.  Asked RN to call LC if Mom has questions or concerns.  Will attempt to visit with Mom in baby's room in the NICU today.   Dorothy Meadows 07/27/2020, 7:57 AM

## 2020-07-28 ENCOUNTER — Other Ambulatory Visit: Payer: Self-pay | Admitting: Obstetrics & Gynecology

## 2020-07-28 LAB — SURGICAL PATHOLOGY

## 2020-07-28 MED ORDER — NIFEDIPINE ER 60 MG PO TB24: 60.0000 mg | ORAL_TABLET | Freq: Two times a day (BID) | ORAL | 0 refills | Status: DC

## 2020-07-28 MED ORDER — OXYCODONE HCL 5 MG PO TABS: 5.0000 mg | ORAL_TABLET | Freq: Four times a day (QID) | ORAL | 0 refills | Status: AC | PRN

## 2020-07-28 MED ORDER — IBUPROFEN 600 MG PO TABS: 600.0000 mg | ORAL_TABLET | Freq: Four times a day (QID) | ORAL | 0 refills | Status: AC | PRN

## 2020-07-28 MED ORDER — DOCUSATE SODIUM 100 MG PO CAPS: 100.0000 mg | ORAL_CAPSULE | Freq: Two times a day (BID) | ORAL | 0 refills | Status: DC | PRN

## 2020-07-28 MED FILL — DOCUSATE SODIUM 100 MG CAPS: 100 | 15 days supply | Qty: 30 | Fill #0

## 2020-07-28 MED FILL — NIFEdipine ER 60 MG TB24: 60 | 30 days supply | Qty: 60 | Fill #0

## 2020-07-28 MED FILL — IBUPROFEN 600 MG TABLET: 600 | 8 days supply | Qty: 30 | Fill #0

## 2020-07-28 MED FILL — oxyCODONE HCL 5 MG TABS: 5 | 5 days supply | Qty: 20 | Fill #0

## 2020-07-28 NOTE — Clinical Social Work Maternal (Signed)
CLINICAL SOCIAL WORK MATERNAL/CHILD NOTE  Patient Details  Name: NICKAYLA MCINNIS MRN: [id number] Date of Birth: March 29, 1986  Date:  07/27/2020  Clinical Social Worker Initiating Note:  Abundio Miu, [place] Date/Time: Initiated:  07/27/20/14:00     Child's Name:  Luisa Dago   Biological Parents:  Mother,Father (Father: Debbe Mounts)   Need for Interpreter:  None   Reason for Referral:  Current Substance Use/Substance Use During Pregnancy ,Late or No Prenatal Care ,Other (Comment) (limited prenatal care/infant's NICU admission)   Address:  [place] [address]    Phone number:  [phone] (home)     Additional phone number:   Household Members/Support Persons (HM/SP):   Household Member/Support Person 1,Household Member/Support Person 2,Household Member/Support Person 3,Household Member/Support Person 4   HM/SP Name Relationship DOB or Age  HM/SP -22 Anselmo Pickler adopted daughter 16 years old  HM/SP -[place] son 06/04/04  HM/SP -3 Oswaldo Milian Jimenez-[place] son 05/29/07  HM/SP -4 Juanita Hernandez-[place] daughter 01/03/13  HM/SP -5        HM/SP -6        HM/SP -7        HM/SP -8          Natural Supports (not living in the home):  Friends   Professional Supports: None   Employment: Unemployed   Type of Work:     Education:  Public librarian arranged:    Museum/gallery curator Resources:  Medicaid   Other Resources:  [place] Considerations Which May Impact Care:    Strengths:  Ability to meet basic needs ,Home prepared for child ,Understanding of illness   Psychotropic Medications:         Pediatrician:       Pediatrician List:   [place]      Pediatrician Fax Number:    Risk Factors/Current Problems:  Substance Use    Cognitive State:  Able to Concentrate ,Alert ,Goal  Oriented ,Insightful ,Linear Thinking    Mood/Affect:  Calm ,Interested ,Comfortable ,Relaxed    CSW Assessment: CSW met with MOB at bedside to discuss limited prenatal care and infant's NICU admission. CSW introduced self and explained reason for consult. MOB was welcoming, pleasant and remained engaged during assessment. MOB reported that she resides with her four older children. MOB reported that she receives food stamps and plans to apply for [facility]. MOB reported that she has all items needed to care for infant including a car seat and basinet. CSW inquired about MOB's support system, MOB reported that her best friend/sister is her support. MOB shared that FOB will be involved.   CSW inquired about MOB's mental health history. MOB denied any mental health history and shared that her family has mental health history. MOB denied any postpartum depression history. CSW inquired about how MOB was feeling emotionally after giving birth, MOB reported that she was feeling great. MOB presented calm and did not demonstrate any acute mental health signs/symtpoms. CSW assessed for safety, MOB denied any SI, HI and domestic violence.   CSW provided education regarding the baby blues period vs. perinatal mood disorders, discussed treatment and gave resources for mental health follow up if concerns arise.  CSW recommends self-evaluation during the postpartum time period using the New Mom Checklist from Postpartum Progress and encouraged MOB to contact a  medical professional if symptoms are noted at any time.    CSW provided review of Sudden Infant Death Syndrome (SIDS) precautions.    CSW and MOB discussed infant's NICU admission. CSW informed MOB about the NICU, what to expect and resources/supports available while infant is admitted to the NICU. MOB reported that she feels well informed about infant's care and denied any transportation barriers with visiting infant in the NICU. MOB denied any  questions/concerns regarding the NICU.   CSW informed MOB about the hospital drug screen policy due to limited prenatal care. MOB confirmed having 3 prenatal visits. CSW inquired about substance use during pregnancy, MOB reported that she used marijuana. MOB reported that her last use of marijuana was the day before admission. MOB denied any additional substance use during pregnancy. CSW informed MOB that infant's UDS was negative and CDS would continue to be monitored and a CPS report would be made if warranted. MOB verbalized understanding and reported that she had CPS history 7 years ago for the same reason when her daughter was born.   CSW will continue to offer resources/supports while infant is admitted to the NICU.    CSW Plan/Description:  Psychosocial Support and Ongoing Assessment of Needs,Sudden Infant Death Syndrome (SIDS) Education,Perinatal Mood and Anxiety Disorder (PMADs) Education,Hospital Drug Screen Policy Information,CSW Will Continue to Monitor Umbilical Cord Tissue Drug Screen Results and Make Report if [facility] Education    Burnis Medin, LCSW 07/28/2020, 9:42 AM

## 2020-07-28 NOTE — Discharge Instructions (Signed)

## 2020-07-28 NOTE — Plan of Care (Signed)
  Problem: Health Behavior/Discharge Planning: Goal: Ability to manage health-related needs will improve Outcome: Adequate for Discharge   Problem: Clinical Measurements: Goal: Ability to maintain clinical measurements within normal limits will improve Outcome: Adequate for Discharge Goal: Will remain free from infection Outcome: Adequate for Discharge Goal: Diagnostic test results will improve Outcome: Adequate for Discharge Goal: Respiratory complications will improve Outcome: Adequate for Discharge Goal: Cardiovascular complication will be avoided Outcome: Adequate for Discharge   Problem: Elimination: Goal: Will not experience complications related to bowel motility Outcome: Adequate for Discharge   Problem: Pain Managment: Goal: General experience of comfort will improve Outcome: Adequate for Discharge   Problem: Safety: Goal: Ability to remain free from injury will improve Outcome: Adequate for Discharge   Problem: Skin Integrity: Goal: Risk for impaired skin integrity will decrease Outcome: Adequate for Discharge   Problem: Education: Goal: Knowledge of the prescribed therapeutic regimen will improve Outcome: Adequate for Discharge   Problem: Fluid Volume: Goal: Peripheral tissue perfusion will improve Outcome: Adequate for Discharge   Problem: Clinical Measurements: Goal: Complications related to disease process, condition or treatment will be avoided or minimized Outcome: Adequate for Discharge   Problem: Education: Goal: Knowledge of condition will improve Outcome: Adequate for Discharge Goal: Individualized Educational Video(s) Outcome: Adequate for Discharge Goal: Individualized Newborn Educational Video(s) Outcome: Adequate for Discharge   Problem: Activity: Goal: Will verbalize the importance of balancing activity with adequate rest periods Outcome: Adequate for Discharge Goal: Ability to tolerate increased activity will improve Outcome: Adequate  for Discharge   Problem: Coping: Goal: Ability to identify and utilize available resources and services will improve Outcome: Adequate for Discharge   Problem: Life Cycle: Goal: Chance of risk for complications during the postpartum period will decrease Outcome: Adequate for Discharge   Problem: Role Relationship: Goal: Ability to demonstrate positive interaction with newborn will improve Outcome: Adequate for Discharge   Problem: Skin Integrity: Goal: Demonstration of wound healing without infection will improve Outcome: Adequate for Discharge

## 2020-08-03 ENCOUNTER — Ambulatory Visit: Payer: Medicaid Other

## 2020-08-05 ENCOUNTER — Encounter: Payer: Self-pay | Admitting: Obstetrics & Gynecology

## 2020-08-05 ENCOUNTER — Other Ambulatory Visit: Payer: Self-pay

## 2020-08-05 ENCOUNTER — Ambulatory Visit (INDEPENDENT_AMBULATORY_CARE_PROVIDER_SITE_OTHER): Payer: Medicaid Other | Admitting: Obstetrics & Gynecology

## 2020-08-05 VITALS — BP 143/92 | HR 84 | Ht 69.0 in | Wt 204.0 lb

## 2020-08-05 DIAGNOSIS — Z0130 Encounter for examination of blood pressure without abnormal findings: Secondary | ICD-10-CM | POA: Diagnosis not present

## 2020-08-05 DIAGNOSIS — Z5189 Encounter for other specified aftercare: Secondary | ICD-10-CM

## 2020-08-05 NOTE — Patient Instructions (Signed)
Return to clinic for any scheduled appointments or for any gynecologic concerns as needed.   

## 2020-08-05 NOTE — Progress Notes (Signed)
POSTPARTUM OFFICE VISIT NOTE  History:   Dorothy Meadows is a 35 y.o. [id number] here today for incision and BP check one week after discharge. Had LTCS on 07/25/2020 at [redacted]w[redacted]d for placental abruption, breech presentation, severe preeclampsia. Discharged on POD#3 with Procardia XL 60 mg bid for BP control.  Today, she reports feeling very stressed out. Also feels like the Procardia is making her feel weird, wants to know if she can decrease dosage or take other medication.  Also reports rash on chest noticed in hospital, also thinks it is due to Procardia.  Baby is doing well in NICU. No other concerns noted.     Past Medical History:  Diagnosis Date  . Genital warts   . GERD (gastroesophageal reflux disease) 11/14/2012  . Gestational diabetes    first pregnancy  . Gonorrhea    1st trimester this pregnancy  . Hyperlipidemia   . Hypertension   . Severe preeclampsia   . SVT (supraventricular tachycardia) (HCC)     Past Surgical History:  Procedure Laterality Date  . CESAREAN SECTION N/A 07/25/2020   Procedure: CESAREAN SECTION;  Surgeon: Adam Phenix, MD;  Location: [facility] LD ORS;  Service: Obstetrics;  Laterality: N/A;  . removal of warts      The following portions of the patient's history were reviewed and updated as appropriate: allergies, current medications, past family history, past medical history, past social history, past surgical history and problem list.   Health Maintenance:  Normal pap and negative HRHPV on 03/05/2020.   Review of Systems:  Pertinent items noted in HPI and remainder of comprehensive ROS otherwise negative.  Physical Exam:  BP (!) 143/92   Pulse 84   Ht 5\' 9"  (1.753 m)   Wt 204 lb (92.5 kg)   LMP 11/09/2019 (Approximate)   Breastfeeding Yes   BMI 30.13 kg/m  CONSTITUTIONAL: Well-developed, well-nourished female in no acute distress.  HEENT:  Normocephalic, atraumatic. External right and left ear normal. No scleral icterus.  NECK: Normal range of  motion, supple, no masses noted on observation SKIN: No rash noted. Not diaphoretic. No erythema. No pallor. MUSCULOSKELETAL: Normal range of motion. No edema noted. NEUROLOGIC: Alert and oriented to person, place, and time. Normal muscle tone coordination. No cranial nerve deficit noted. PSYCHIATRIC: Depressed mood and affect. Normal behavior. Normal judgment and thought content. CARDIOVASCULAR: Normal heart rate noted RESPIRATORY: Effort and breath sounds normal, no problems with respiration noted ABDOMEN: Well healing incision, no drainage, no erythema.  No masses noted. No other overt distention noted.   PELVIC: Deferred  Labs and Imaging No results found for this or any previous visit (from the past 168 hour(s)).     Assessment and Plan:      1. Visit for wound check Healing well, wound car discussed.  2. BP check Stable BP, recommended she continue Procardia XL 60 mg bid,  Symptoms reported are not usually seen with Procardia XL, did give option to try something else but also noted that her BP is controlled on this already.  She will continue for now, and let 01/10/2020 know if symptoms worsen.  She declined referral to [facility] clinician.  RN will follow up with patient next week given concern about her depressed mood.   Please refer to After Visit Summary for other counseling recommendations.   Return in about 3 weeks (around 08/26/2020) for Postpartum check.     08/28/2020, MD, FACOG Obstetrician & Gynecologist, [facility] for [facility] [employer]  [facility]

## 2020-08-14 ENCOUNTER — Ambulatory Visit: Payer: Self-pay

## 2020-08-14 NOTE — Lactation Note (Signed)
This note was copied from a baby's chart. Lactation Consultation Note LC has made multiple attempts to visit mother. Per RN, mother is no longer providing her milk. LC services are complete. Will f/u prn only. Patient Name: Dorothy Meadows OIPPG'F Date: 08/14/2020   Age:36 wk.o.   Elder Negus, MA IBCLC 08/14/2020, 11:38 AM

## 2020-08-24 ENCOUNTER — Encounter: Payer: Self-pay | Admitting: Advanced Practice Midwife

## 2020-08-24 ENCOUNTER — Other Ambulatory Visit: Payer: Self-pay

## 2020-08-24 ENCOUNTER — Ambulatory Visit (INDEPENDENT_AMBULATORY_CARE_PROVIDER_SITE_OTHER): Payer: Medicaid Other | Admitting: Advanced Practice Midwife

## 2020-08-24 VITALS — BP 112/76 | HR 78 | Wt 205.0 lb

## 2020-08-24 DIAGNOSIS — Z3009 Encounter for other general counseling and advice on contraception: Secondary | ICD-10-CM

## 2020-08-24 DIAGNOSIS — Z8759 Personal history of other complications of pregnancy, childbirth and the puerperium: Secondary | ICD-10-CM

## 2020-08-24 DIAGNOSIS — M5440 Lumbago with sciatica, unspecified side: Secondary | ICD-10-CM

## 2020-08-24 DIAGNOSIS — Z9189 Other specified personal risk factors, not elsewhere classified: Secondary | ICD-10-CM

## 2020-08-24 MED ORDER — IBUPROFEN 600 MG PO TABS
600.0000 mg | ORAL_TABLET | Freq: Four times a day (QID) | ORAL | 1 refills | Status: AC | PRN
Start: 2020-08-24 — End: ?

## 2020-08-24 MED ORDER — NORETHINDRONE 0.35 MG PO TABS: 1.0000 | ORAL_TABLET | Freq: Every day | ORAL | 11 refills | Status: AC

## 2020-08-24 NOTE — Progress Notes (Signed)
Post Partum Visit Note  Dorothy Meadows is a 35 y.o. [phone] female who presents for a postpartum visit. She is 4 weeks postpartum following a primary cesarean section.  I have fully reviewed the prenatal and intrapartum course. The delivery was at 33.2 gestational weeks.  Anesthesia: spinal. Postpartum course has been challenging due to baby in NICU. Baby is doing well-still in NICU. Baby is feeding by doanted breast milk in NICU. Bleeding no bleeding. Bowel function is normal. Bladder function is normal. Patient is not sexually active. Contraception method is OCP (estrogen/progesterone). Postpartum depression screening: negative (score 6).   The pregnancy intention screening data noted above was reviewed. Potential methods of contraception were discussed. The patient elected to proceed with Oral Contraceptive.    Edinburgh Postnatal Depression Scale - 08/24/20 1322      Edinburgh Postnatal Depression Scale:  In the Past 7 Days   I have been able to laugh and see the funny side of things. 0    I have looked forward with enjoyment to things. 0    I have blamed myself unnecessarily when things went wrong. 2    I have been anxious or worried for no good reason. 1    I have felt scared or panicky for no good reason. 0    Things have been getting on top of me. 1    I have been so unhappy that I have had difficulty sleeping. 1    I have felt sad or miserable. 1    I have been so unhappy that I have been crying. 0    The thought of harming myself has occurred to me. 0    Edinburgh Postnatal Depression Scale Total 6           Health Maintenance Due  Topic Date Due  . COVID-19 Vaccine (1) Never done    The following portions of the patient's history were reviewed and updated as appropriate: allergies, current medications, past family history, past medical history, past social history, past surgical history and problem list.  Review of Systems Pertinent items noted in HPI and  remainder of comprehensive ROS otherwise negative.  Objective:  BP 112/76   Pulse 78   Wt 205 lb (93 kg)   BMI 30.27 kg/m    VS reviewed, nursing note reviewed,  Constitutional: well developed, well nourished, no distress HEENT: normocephalic CV: normal rate Pulm/chest wall: normal effort Abdomen: soft Neuro: alert and oriented x 3 Skin: warm, dry. Low transverse abdominal incision well approximated without edema, erythema, or exudate Psych: affect normal  Assessment:   1. At risk for postpartum depression --Infant in NICU, pt is overall doing well but has increased stress with baby still not home  --Discussed self care today, as pt not able to rest as much travelling back an forth between NICU and home - Ambulatory referral to Integrated Behavioral Health  2. Encounter for counseling regarding contraception --Discussed LARCs as most effective forms of birth control.  Discussed benefits/risks of other methods.  Pt desires OCPs. Discussed pt risk factors for OCPs including smoking, age over 51, and HTN.  Pt is also pumping/breastfeeding so estrogen can lower milk supply. Discussed progesterone only options and pt prefers to start POPs. Discussed failure rates of OCPs with regular use. Rx sent to pharmacy.    - norethindrone (MICRONOR) 0.35 MG tablet; Take 1 tablet (0.35 mg total) by mouth daily.  Dispense: 30 tablet; Refill: 11  3. Postpartum examination following cesarean delivery  4. Acute midline low back pain with sciatica, sciatica laterality unspecified --Rest/ice/heat//Tylenol/ibuprofen --Rx for ibuprofen 600 mg Q 6 hours PRN - Ambulatory referral to Physical Therapy  Plan:   Essential components of care per ACOG recommendations:  1.  Mood and well being: Patient with negative depression screening today. Reviewed local resources for support.  - Patient does use tobacco. If using tobacco we discussed reduction and for recently cessation risk of relapse - hx of drug  use? No    2. Infant care and feeding:  -Patient currently breastmilk feeding? Yes    If breastmilk feeding discussed return to work and pumping. If needed, patient was provided letter for work to allow for every 2-3 hr pumping breaks, and to be granted a private location to express breastmilk and refrigerated area to store breastmilk. Reviewed importance of draining breast regularly to support lactation. -Social determinants of health (SDOH) reviewed in EPIC. Smoking, no other concerns.        3. Sexuality, contraception and birth spacing - Patient does not want a pregnancy in the next year.  - Reviewed forms of contraception in tiered fashion. Patient desired oral progesterone-only contraceptive today.   - Discussed birth spacing of 18 months  4. Sleep and fatigue -Encouraged family/partner/community support of 4 hrs of uninterrupted sleep to help with mood and fatigue  5. Physical Recovery  - Discussed patients delivery and complications - Patient had a C-section emergent. IUGR, breech, HTN, with vaginal bleeding concerning for abruptio placenta  - Patient has urinary incontinence? No  - Patient is safe to resume physical and sexual activity  6.  Health Maintenance - HM due items addressed Yes - Last pap smear done 03/05/20 and was normal with negative HPV. Pap smear not done at today's visit.    7. Chronic Disease --BP wnl on Procardia daily --Continue medications - PCP follow up  Sharen Counter, CNM [facility] [employer]

## 2020-08-26 ENCOUNTER — Other Ambulatory Visit: Payer: Self-pay | Admitting: Obstetrics & Gynecology

## 2020-08-26 DIAGNOSIS — Z3046 Encounter for surveillance of implantable subdermal contraceptive: Secondary | ICD-10-CM

## 2020-09-09 ENCOUNTER — Ambulatory Visit: Payer: Medicaid Other | Admitting: Obstetrics & Gynecology

## 2020-09-24 NOTE — BH Specialist Note (Signed)
Integrated Behavioral Health via Telemedicine Visit  09/24/2020 Dorothy Meadows [id number]  Number of Integrated Behavioral Health visits: 1 Session Start time: 2:27  Session End time: 2:40 Total time:  13  Referring Provider: Sharen Counter, CNM Patient/Family location: Home [facility] Provider location: [facility]  All persons participating in visit: Patient Dorothy Meadows and Dorothy Meadows   Types of Service: Individual psychotherapy and Video visit  I connected with Dorothy Meadows and/or Dorothy Meadows's  n/a  via  Telephone or Video Enabled Telemedicine Application  (Video is Caregility application) and verified that I am speaking with the correct person using two identifiers. Discussed confidentiality: Yes   I discussed the limitations of telemedicine and the availability of in person appointments.  Discussed there is a possibility of technology failure and discussed alternative modes of communication if that failure occurs.  I discussed that engaging in this telemedicine visit, they consent to the provision of behavioral healthcare and the services will be billed under their insurance.  Patient and/or legal guardian expressed understanding and consented to Telemedicine visit: Yes   Presenting Concerns: Patient and/or family reports the following symptoms/concerns: Pt states her primary symptom is fatigue, while adjusting to baby in NICU; mood is improved since baby may be coming home from NICU tomorrow; family/friends supportive and all eager to meet baby. Pt has no particular concern today.  Duration of problem:  ; Severity of problem: mild  Patient and/or Family's Strengths/Protective Factors: Social connections, Concrete supports in place (healthy food, safe environments, etc.), Sense of purpose, and Physical Health (exercise, healthy diet, medication compliance, etc.)  Goals Addressed: Patient will:  Reduce  symptoms of: stress   Demonstrate ability to: Increase healthy adjustment to current life circumstances  Progress towards Goals: Ongoing  Interventions: Interventions utilized:  Psychoeducation and/or Health Education and Supportive Reflection Standardized Assessments completed: Not Needed  Patient and/or Family Response: Pt agrees with treatment plan  Assessment: Patient currently experiencing Psychosocial stress.   Patient may benefit from psychoeducation and brief therapeutic interventions regarding coping with symptoms of current life stress .  Plan: Follow up with behavioral health clinician on : Call Dorothy Meadows as needed at [phone] Behavioral recommendations:  - Continue with plan to celebrate baby coming home with family -Consider attending new mom support group, as needed ([email or web address] and/or www.[email or web address].net) Referral(s): Integrated [employer] (In Clinic)  I discussed the assessment and treatment plan with the patient and/or parent/guardian. They were provided an opportunity to ask questions and all were answered. They agreed with the plan and demonstrated an understanding of the instructions.   They were advised to call back or seek an in-person evaluation if the symptoms worsen or if the condition fails to improve as anticipated.  Dorothy Close Ranferi Clingan, LCSW  Edinburgh Postnatal Depression Scale Screening Tool 08/24/2020 07/27/2020  I have been able to laugh and see the funny side of things. 0 0  I have looked forward with enjoyment to things. 0 0  I have blamed myself unnecessarily when things went wrong. 2 1  I have been anxious or worried for no good reason. 1 1  I have felt scared or panicky for no good reason. 0 0  Things have been getting on top of me. 1 1  I have been so unhappy that I have had difficulty sleeping. 1 0  I have felt sad or miserable. 1 0  I have been so unhappy that I have been crying.  0 0  The thought of harming myself  has occurred to me. 0 0  Edinburgh Postnatal Depression Scale Total 6 3

## 2020-10-07 ENCOUNTER — Ambulatory Visit: Payer: Medicaid Other | Admitting: Clinical

## 2020-10-07 DIAGNOSIS — Z658 Other specified problems related to psychosocial circumstances: Secondary | ICD-10-CM

## 2020-11-02 ENCOUNTER — Encounter: Payer: Medicaid Other | Attending: Advanced Practice Midwife | Admitting: Physical Therapy

## 2020-11-09 ENCOUNTER — Encounter: Payer: Medicaid Other | Admitting: Physical Therapy

## 2020-11-16 ENCOUNTER — Encounter: Payer: Medicaid Other | Admitting: Physical Therapy

## 2020-11-23 ENCOUNTER — Ambulatory Visit: Payer: Medicaid Other | Admitting: Obstetrics and Gynecology

## 2020-11-30 ENCOUNTER — Encounter: Payer: Medicaid Other | Admitting: Physical Therapy

## 2022-08-08 IMAGING — US US MFM FETAL BPP W/O NON-STRESS
1 series · 15 of 25 positions shown · non-contrast
Comparison: none

[Series 1: us mfm fetal bpp w/o non-stress · 25 acquisitions, 15 frames shown]
[im 1/25]
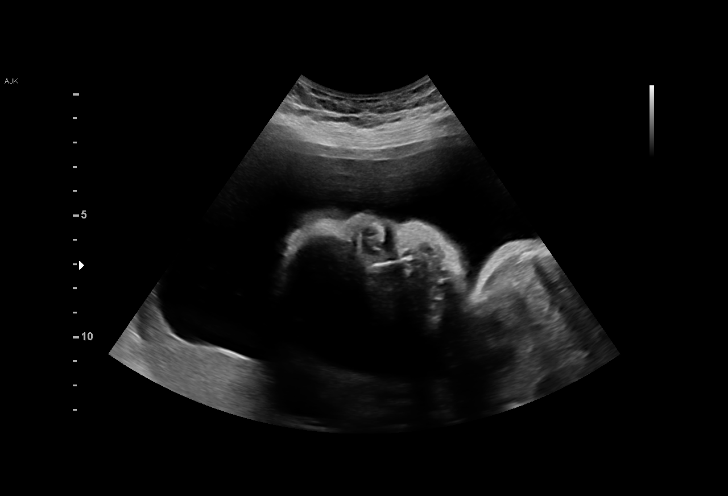
[im 3/25]
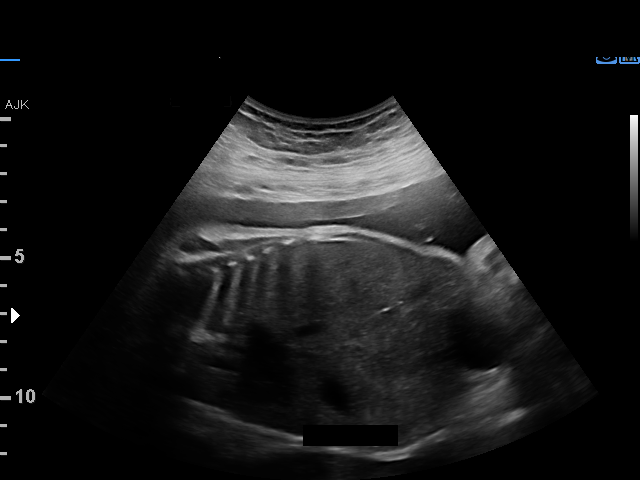
[im 5/25]
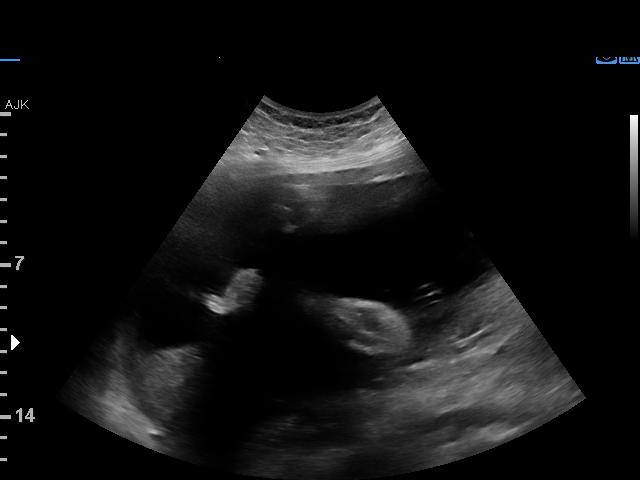
[im 6/25]
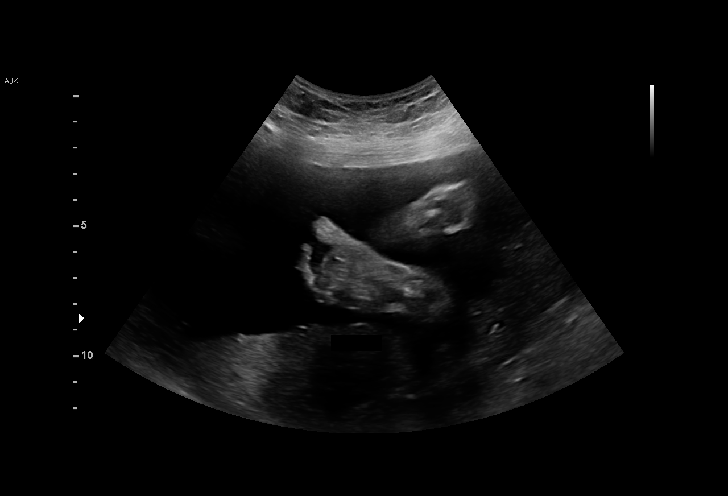
[im 8/25]
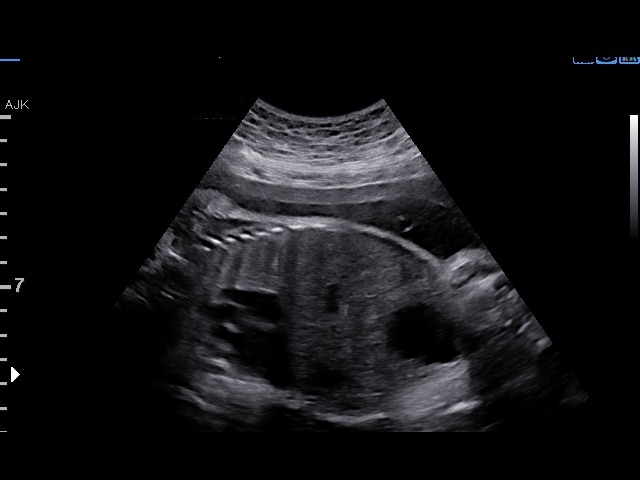
[im 10/25]
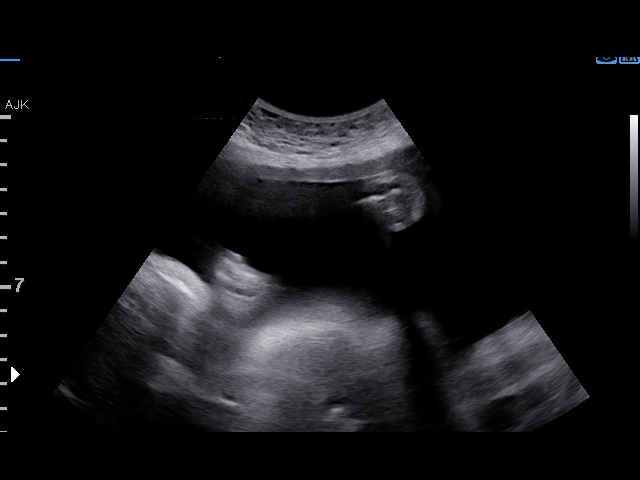
[im 11/25]
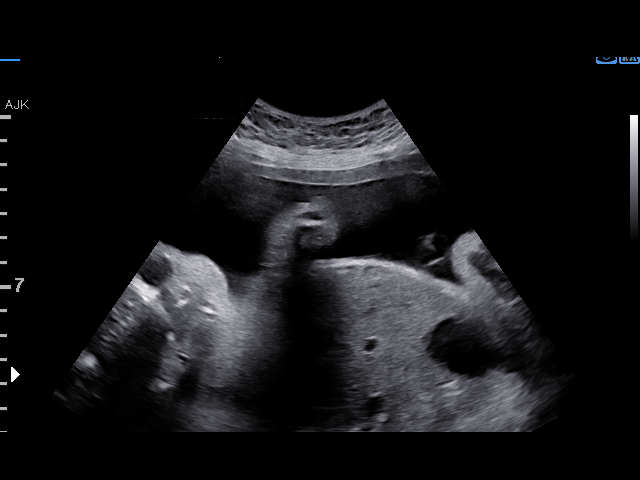
[im 13/25]
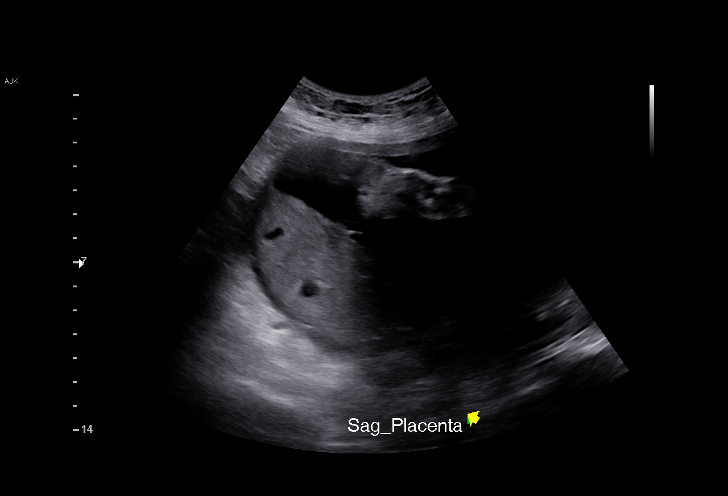
[im 15/25]
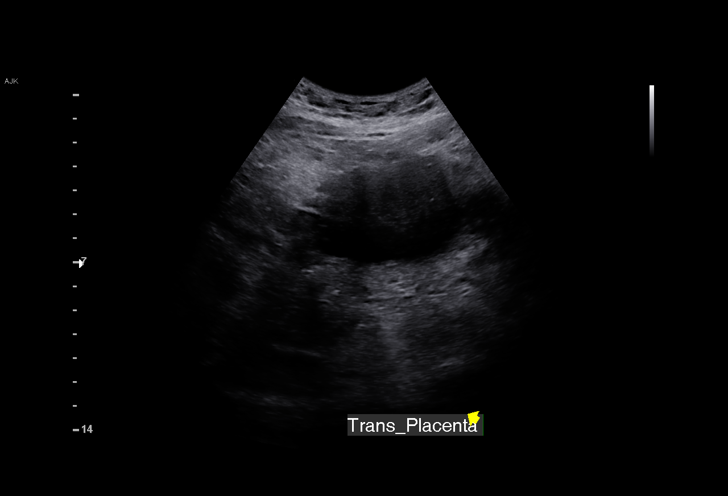
[im 16/25]
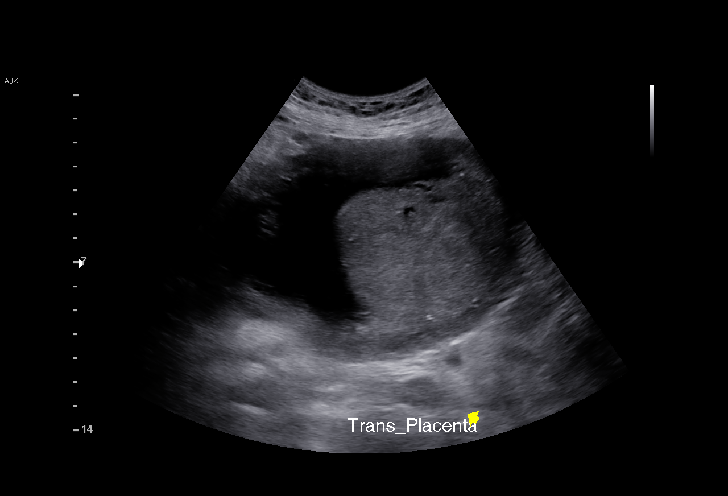
[im 18/25]
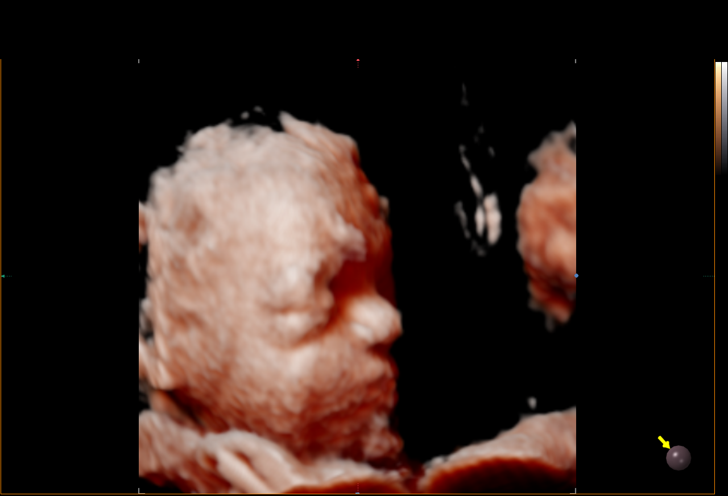
[im 20/25]
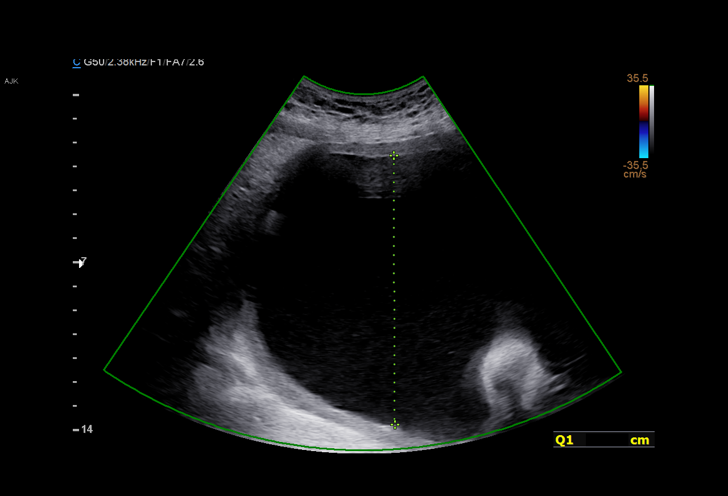
[im 21/25]
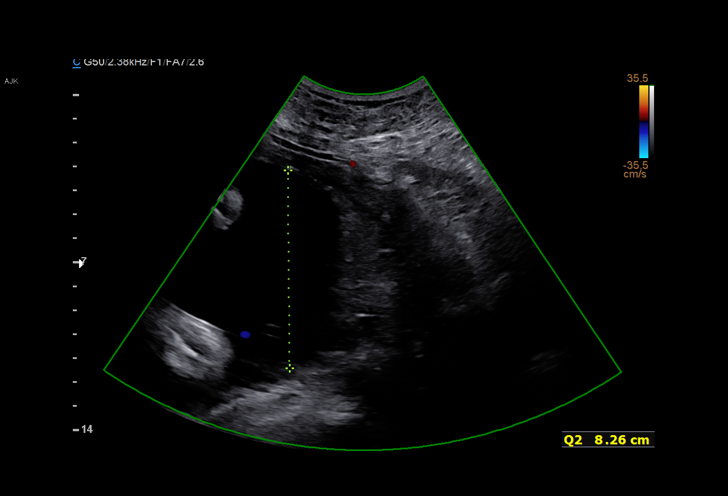
[im 23/25]
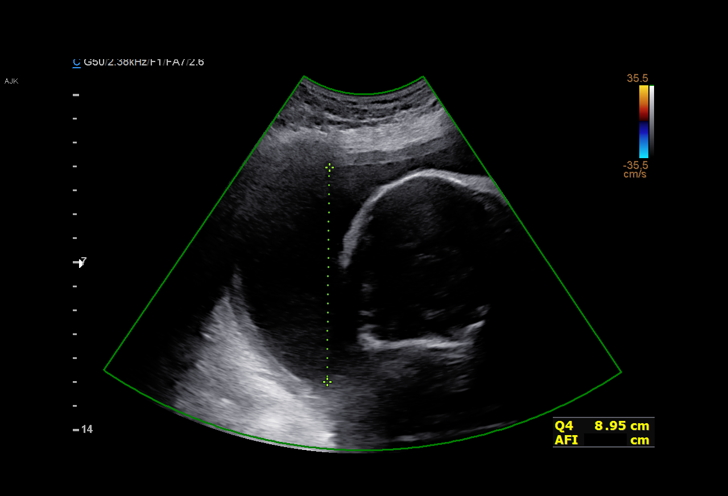
[im 25/25]
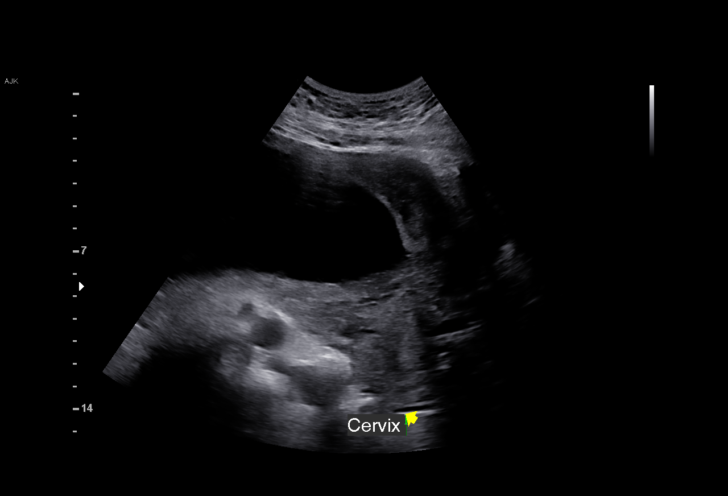

[15 of 25 positions shown; findings below may reference images not displayed]

Indications

 Pre-eclampsia
 33 weeks gestation of pregnancy
 Maternal care for known or suspected poor
 fetal growth, third trimester, not applicable or
 unspecified IUGR
 Polyhydramnios, third trimester, antepartum
 condition or complication, unspecified fetus
 Advanced maternal age multigravida 35+,
 second trimester
 Medical complication of pregnancy (SVT)
 Poor obstetric history: Previous gestational
 diabetes
Fetal Evaluation

 Num Of Fetuses:         1
 Fetal Heart Rate(bpm):  131
 Cardiac Activity:       Observed
 Presentation:           Breech
 Placenta:               Posterior
 P. Cord Insertion:      Visualized

 Amniotic Fluid
 AFI FV:      Polyhydramnios

 AFI Sum(cm)     %Tile       Largest Pocket(cm)
 33.             > 97

 RUQ(cm)       RLQ(cm)       LUQ(cm)        LLQ(cm)
 11.2          9
Biophysical Evaluation
 Amniotic F.V:   Polyhydramnios             F. Tone:        Observed
 F. Movement:    Observed                   Score:          [DATE]
 F. Breathing:   Observed
OB History

 Gravidity:    5         Term:   3        Prem:   0        SAB:   1
 TOP:          0       Ectopic:  0        Living: 3
Gestational Age

 LMP:           36w 5d        Date:  11/09/19                 EDD:   08/15/20
 Best:          33w 0d     Det. By:  U/S  (03/30/20)          EDD:   09/10/20
Comments

 This patient has been hospitalized due to preeclampsia.
 IUGR was noted on her last growth scan.  She is currently
 treated with Procardia and has received a complete course of
 antenatal corticosteroids.
 A biophysical profile performed today was [DATE].
 Polyhydramnios with a total AFI of 33 cm continues to be
 noted.

## 2023-07-29 ENCOUNTER — Encounter (HOSPITAL_COMMUNITY): Payer: Self-pay | Admitting: *Deleted

## 2023-07-29 ENCOUNTER — Emergency Department (HOSPITAL_COMMUNITY)
Admission: EM | Admit: 2023-07-29 | Discharge: 2023-07-30 | Disposition: A | Discharge status: Home / Self Care | Attending: Emergency Medicine | Admitting: Emergency Medicine

## 2023-07-29 ENCOUNTER — Other Ambulatory Visit: Payer: Self-pay

## 2023-07-29 DIAGNOSIS — I10 Essential (primary) hypertension: Secondary | ICD-10-CM | POA: Diagnosis not present

## 2023-07-29 DIAGNOSIS — R059 Cough, unspecified: Secondary | ICD-10-CM | POA: Diagnosis present

## 2023-07-29 DIAGNOSIS — U071 COVID-19: Secondary | ICD-10-CM | POA: Insufficient documentation

## 2023-07-29 DIAGNOSIS — F17200 Nicotine dependence, unspecified, uncomplicated: Secondary | ICD-10-CM | POA: Diagnosis not present

## 2023-07-29 LAB — COMPREHENSIVE METABOLIC PANEL WITH GFR
ALT: 11 U/L (ref 0–44)
AST: 16 U/L (ref 15–41)
Albumin: 3.7 g/dL (ref 3.5–5.0)
Alkaline Phosphatase: 63 U/L (ref 38–126)
Anion gap: 10 (ref 5–15)
BUN: 9 mg/dL (ref 6–20)
CO2: 24 mmol/L (ref 22–32)
Calcium: 9.1 mg/dL (ref 8.9–10.3)
Chloride: 101 mmol/L (ref 98–111)
Creatinine, Ser: 0.8 mg/dL (ref 0.44–1.00)
GFR, Estimated: >60 mL/min (ref >60)
Glucose, Bld: 109 mg/dL — ABNORMAL HIGH (ref 70–99)
Potassium: 3.2 mmol/L — ABNORMAL LOW (ref 3.5–5.1)
Sodium: 135 mmol/L (ref 135–145)
Total Bilirubin: 0.4 mg/dL (ref 0.0–1.2)
Total Protein: 7.4 g/dL (ref 6.5–8.1)

## 2023-07-29 LAB — CBC
HCT: 41.7 % (ref 36.0–46.0)
Hemoglobin: 13.4 g/dL (ref 12.0–15.0)
MCH: 28.8 pg (ref 26.0–34.0)
MCHC: 32.1 g/dL (ref 30.0–36.0)
MCV: 89.7 fL (ref 80.0–100.0)
Platelets: 178 10*3/uL (ref 150–400)
RBC: 4.65 MIL/uL (ref 3.87–5.11)
RDW: 13.9 % (ref 11.5–15.5)
WBC: 5.8 10*3/uL (ref 4.0–10.5)
nRBC: 0 % (ref 0.0–0.2)

## 2023-07-29 LAB — HCG, SERUM, QUALITATIVE: Preg, Serum: NEGATIVE

## 2023-07-29 MED ORDER — ACETAMINOPHEN 325 MG PO TABS
650.0000 mg | ORAL_TABLET | Freq: Once | ORAL | Status: AC
  Administered 2023-07-29: 650 mg via ORAL
  Filled 2023-07-29: qty 2

## 2023-07-29 NOTE — ED Triage Notes (Signed)
 The pt is c/o general body aches with nausea cough stuffy head  back pain since last night  lmp friday

## 2023-07-30 LAB — URINALYSIS, ROUTINE W REFLEX MICROSCOPIC
Bilirubin Urine: NEGATIVE
Glucose, UA: NEGATIVE mg/dL
Hgb urine dipstick: NEGATIVE
Ketones, ur: NEGATIVE mg/dL
Leukocytes,Ua: NEGATIVE
Nitrite: NEGATIVE
Protein, ur: NEGATIVE mg/dL
Specific Gravity, Urine: 1.024 (ref 1.005–1.030)
pH: 5 (ref 5.0–8.0)

## 2023-07-30 LAB — RESP PANEL BY RT-PCR (RSV, FLU A&B, COVID)  RVPGX2
Influenza A by PCR: NEGATIVE
Influenza B by PCR: NEGATIVE
Resp Syncytial Virus by PCR: NEGATIVE
SARS Coronavirus 2 by RT PCR: POSITIVE — AB

## 2023-07-30 MED ORDER — ONDANSETRON 4 MG PO TBDP: 4.0000 mg | ORAL_TABLET | Freq: Three times a day (TID) | ORAL | 0 refills | Status: AC | PRN

## 2023-07-30 NOTE — Discharge Instructions (Signed)
 You were diagnosed today with COVID-19.  You may take up to a maximum of 1000 mg of acetaminophen every 6 hours and up to 600 mg of ibuprofen every 6 hours for fever and pain control.  I have prescribed Zofran for nausea.  Please take as directed.  Be sure to hydrate at home.  If you develop any life-threatening symptoms please return to the emergency department.

## 2023-07-30 NOTE — ED Provider Notes (Signed)
 [place] EMERGENCY DEPARTMENT AT [facility] Provider Note   CSN: [id number] Arrival date & time: 07/29/23  2306     History  Chief Complaint  Patient presents with   Generalized Body Aches    Dorothy Meadows is a 38 y.o. female.  Patient presents to the emergency department complaining of 1 day of generalized bodyaches with nausea, cough, congestion, headache.  She denies abdominal pain, urinary symptoms, chest pain, shortness of breath.  Past medical history significant for GERD, hypertension  HPI     Home Medications Prior to Admission medications   Medication Sig Start Date End Date Taking? Authorizing Provider  ondansetron (ZOFRAN-ODT) 4 MG disintegrating tablet Take 1 tablet (4 mg total) by mouth every 8 (eight) hours as needed for nausea or vomiting. 07/30/23  Yes Darrick Grinder, PA-C  ibuprofen (ADVIL) 600 MG tablet Take 1 tablet (600 mg total) by mouth every 6 (six) hours as needed for mild pain or moderate pain. Patient not taking: Reported on 08/24/2020 07/28/20   Myna Hidalgo, DO  ibuprofen (ADVIL) 600 MG tablet Take 1 tablet (600 mg total) by mouth every 6 (six) hours as needed. 08/24/20   Leftwich-Kirby, Wilmer Floor, CNM  NIFEdipine (ADALAT CC) 60 MG 24 hr tablet TAKE 1 TABLET (60 MG TOTAL) BY MOUTH 2 (TWO) TIMES DAILY. 07/28/20 07/28/21  Myna Hidalgo, DO  norethindrone (MICRONOR) 0.35 MG tablet Take 1 tablet (0.35 mg total) by mouth daily. 08/24/20   Leftwich-Kirby, Wilmer Floor, CNM      Allergies    Patient has no known allergies.    Review of Systems   Review of Systems  Physical Exam Updated Vital Signs BP (!) 156/103 (BP Location: Right Arm)   Pulse (!) 108   Temp 98.8 F (37.1 C)   Resp 18   Ht 5\' 9"  (1.753 m)   Wt 93 kg   LMP 07/27/2023   SpO2 98%   BMI 30.28 kg/m  Physical Exam Vitals and nursing note reviewed.  Constitutional:      General: She is not in acute distress.    Appearance: She is well-developed.  HENT:     Head:  Normocephalic and atraumatic.  Eyes:     Conjunctiva/sclera: Conjunctivae normal.  Cardiovascular:     Rate and Rhythm: Normal rate and regular rhythm.  Pulmonary:     Effort: Pulmonary effort is normal. No respiratory distress.     Breath sounds: Normal breath sounds.  Musculoskeletal:        General: No swelling.     Cervical back: Neck supple.  Skin:    General: Skin is warm and dry.     Capillary Refill: Capillary refill takes less than 2 seconds.  Neurological:     Mental Status: She is alert.  Psychiatric:        Mood and Affect: Mood normal.     ED Results / Procedures / Treatments   Labs (all labs ordered are listed, but only abnormal results are displayed) Labs Reviewed  RESP PANEL BY RT-PCR (RSV, FLU A&B, COVID)  RVPGX2 - Abnormal; Notable for the following components:      Result Value   SARS Coronavirus 2 by RT PCR POSITIVE (*)    All other components within normal limits  COMPREHENSIVE METABOLIC PANEL WITH GFR - Abnormal; Notable for the following components:   Potassium 3.2 (*)    Glucose, Bld 109 (*)    All other components within normal limits  URINALYSIS, ROUTINE W REFLEX  MICROSCOPIC - Abnormal; Notable for the following components:   APPearance HAZY (*)    All other components within normal limits  CBC  HCG, SERUM, QUALITATIVE    EKG None  Radiology No results found.  Procedures Procedures    Medications Ordered in ED Medications  acetaminophen (TYLENOL) tablet 650 mg (650 mg Oral Given 07/29/23 2322)    ED Course/ Medical Decision Making/ A&P                                 Medical Decision Making  This patient presents to the ED for concern of bodyaches and cough, this involves an extensive number of treatment options, and is a complaint that carries with it a high risk of complications and morbidity.  The differential diagnosis includes COVID, influenza, RSV, other viral illnesses, others   Co morbidities that complicate the patient  evaluation  Hypertension    Lab Tests:  I Ordered, and personally interpreted labs.  The pertinent results include: COVID-positive test, grossly unremarkable CMP   Imaging Studies ordered:  Lungs are clear to auscultation bilaterally, no indication for chest imaging at this time   Problem List / ED Course / Critical interventions / Medication management   I ordered medication including Tylenol for fever/body aches Reevaluation of the patient after these medicines showed that the patient improved I have reviewed the patients home medicines and have made adjustments as needed   Social Determinants of Health:  Patient is a daily smoker   Test / Admission - Considered:  Patient with COVID-19 infection.  Test result consistent with patient's presentation/history.  Plan to discharge home with recommendations for supportive care at home including acetaminophen/ibuprofen.  Discussed Paxlovid with patient who declines medication at this time.  I will send a prescription for Zofran for nausea.  Return precautions provided.          Final Clinical Impression(s) / ED Diagnoses Final diagnoses:  COVID-19    Rx / DC Orders ED Discharge Orders          Ordered    ondansetron (ZOFRAN-ODT) 4 MG disintegrating tablet  Every 8 hours PRN        07/30/23 0100              Darrick Grinder, PA-C 07/30/23 0100    Nira Conn, MD 07/30/23 [phone]
